# Patient Record
Sex: Male | Born: 1960
Health system: Southern US, Community
[De-identification: ages and names within clinical notes are randomized; demographics above are authoritative.]

## PROBLEM LIST (undated history)

## (undated) DIAGNOSIS — M25519 Pain in unspecified shoulder: Secondary | ICD-10-CM

## (undated) DIAGNOSIS — G4700 Insomnia, unspecified: Secondary | ICD-10-CM

## (undated) DIAGNOSIS — F40240 Claustrophobia: Secondary | ICD-10-CM

## (undated) DIAGNOSIS — R200 Anesthesia of skin: Secondary | ICD-10-CM

## (undated) DIAGNOSIS — R51 Headache: Secondary | ICD-10-CM

## (undated) DIAGNOSIS — C4490 Unspecified malignant neoplasm of skin, unspecified: Secondary | ICD-10-CM

## (undated) DIAGNOSIS — K602 Anal fissure, unspecified: Secondary | ICD-10-CM

## (undated) DIAGNOSIS — C801 Malignant (primary) neoplasm, unspecified: Secondary | ICD-10-CM

## (undated) DIAGNOSIS — E785 Hyperlipidemia, unspecified: Secondary | ICD-10-CM

## (undated) DIAGNOSIS — N189 Chronic kidney disease, unspecified: Secondary | ICD-10-CM

## (undated) DIAGNOSIS — M76899 Other specified enthesopathies of unspecified lower limb, excluding foot: Secondary | ICD-10-CM

## (undated) DIAGNOSIS — M1990 Unspecified osteoarthritis, unspecified site: Secondary | ICD-10-CM

## (undated) DIAGNOSIS — K219 Gastro-esophageal reflux disease without esophagitis: Secondary | ICD-10-CM

## (undated) DIAGNOSIS — Z8719 Personal history of other diseases of the digestive system: Secondary | ICD-10-CM

## (undated) DIAGNOSIS — Z87442 Personal history of urinary calculi: Secondary | ICD-10-CM

## (undated) DIAGNOSIS — I499 Cardiac arrhythmia, unspecified: Secondary | ICD-10-CM

## (undated) DIAGNOSIS — E119 Type 2 diabetes mellitus without complications: Principal | ICD-10-CM

## (undated) DIAGNOSIS — R030 Elevated blood-pressure reading, without diagnosis of hypertension: Secondary | ICD-10-CM

## (undated) HISTORY — DX: Unspecified osteoarthritis, unspecified site: M19.90

## (undated) HISTORY — DX: Pain in unspecified shoulder: M25.519

## (undated) HISTORY — PX: LIPOMA EXCISION: SHX5283

## (undated) HISTORY — DX: Anal fissure, unspecified: K60.2

## (undated) HISTORY — DX: Other specified enthesopathies of unspecified lower limb, excluding foot: M76.899

## (undated) HISTORY — DX: Chronic kidney disease, unspecified: N18.9

## (undated) HISTORY — DX: Malignant (primary) neoplasm, unspecified: C80.1

## (undated) HISTORY — DX: Type 2 diabetes mellitus without complications: E11.9

## (undated) HISTORY — PX: OTHER SURGICAL HISTORY: SHX169

## (undated) HISTORY — DX: Hyperlipidemia, unspecified: E78.5

## (undated) HISTORY — DX: Elevated blood-pressure reading, without diagnosis of hypertension: R03.0

---
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
## 1992-08-04 HISTORY — PX: VASECTOMY: SHX75

## 1998-08-04 HISTORY — PX: BACK SURGERY: SHX140

## 1999-08-05 HISTORY — PX: HERNIA REPAIR: SHX51

## 2007-04-27 ENCOUNTER — Ambulatory Visit (HOSPITAL_BASED_OUTPATIENT_CLINIC_OR_DEPARTMENT_OTHER): Admission: RE | Admit: 2007-04-27 | Discharge: 2007-04-27 | Payer: Self-pay | Admitting: Urology

## 2007-04-29 ENCOUNTER — Ambulatory Visit (HOSPITAL_COMMUNITY): Admission: RE | Admit: 2007-04-29 | Discharge: 2007-04-29 | Payer: Self-pay | Admitting: Urology

## 2008-06-23 ENCOUNTER — Ambulatory Visit: Payer: Self-pay | Admitting: Gastroenterology

## 2008-06-23 DIAGNOSIS — R933 Abnormal findings on diagnostic imaging of other parts of digestive tract: Secondary | ICD-10-CM | POA: Insufficient documentation

## 2008-07-06 ENCOUNTER — Ambulatory Visit (HOSPITAL_COMMUNITY): Admission: RE | Admit: 2008-07-06 | Discharge: 2008-07-06 | Payer: Self-pay | Admitting: Gastroenterology

## 2008-07-06 ENCOUNTER — Encounter: Payer: Self-pay | Admitting: Gastroenterology

## 2008-07-06 ENCOUNTER — Ambulatory Visit: Payer: Self-pay | Admitting: Gastroenterology

## 2008-07-13 ENCOUNTER — Ambulatory Visit (HOSPITAL_COMMUNITY): Admission: RE | Admit: 2008-07-13 | Discharge: 2008-07-13 | Payer: Self-pay | Admitting: General Surgery

## 2008-07-18 ENCOUNTER — Encounter: Payer: Self-pay | Admitting: Gastroenterology

## 2008-07-24 ENCOUNTER — Encounter: Payer: Self-pay | Admitting: Family Medicine

## 2008-08-04 DIAGNOSIS — C259 Malignant neoplasm of pancreas, unspecified: Secondary | ICD-10-CM

## 2008-08-04 HISTORY — DX: Malignant neoplasm of pancreas, unspecified: C25.9

## 2008-08-04 HISTORY — PX: OTHER SURGICAL HISTORY: SHX169

## 2008-08-04 HISTORY — PX: BIOPSY PANCREAS: PRO26

## 2008-08-14 ENCOUNTER — Encounter: Payer: Self-pay | Admitting: Family Medicine

## 2008-08-14 ENCOUNTER — Inpatient Hospital Stay (HOSPITAL_COMMUNITY): Admission: RE | Admit: 2008-08-14 | Discharge: 2008-08-18 | Payer: Self-pay | Admitting: General Surgery

## 2008-08-14 ENCOUNTER — Encounter (INDEPENDENT_AMBULATORY_CARE_PROVIDER_SITE_OTHER): Payer: Self-pay | Admitting: General Surgery

## 2008-08-23 ENCOUNTER — Encounter: Payer: Self-pay | Admitting: Gastroenterology

## 2008-10-05 ENCOUNTER — Encounter: Payer: Self-pay | Admitting: Gastroenterology

## 2008-10-18 ENCOUNTER — Emergency Department (HOSPITAL_COMMUNITY): Admission: EM | Admit: 2008-10-18 | Discharge: 2008-10-18 | Payer: Self-pay | Admitting: Emergency Medicine

## 2008-10-20 ENCOUNTER — Ambulatory Visit: Payer: Self-pay | Admitting: Family Medicine

## 2008-10-20 DIAGNOSIS — M25519 Pain in unspecified shoulder: Secondary | ICD-10-CM

## 2008-10-20 DIAGNOSIS — S8000XA Contusion of unspecified knee, initial encounter: Secondary | ICD-10-CM | POA: Insufficient documentation

## 2008-10-20 HISTORY — DX: Pain in unspecified shoulder: M25.519

## 2008-11-06 ENCOUNTER — Telehealth: Payer: Self-pay | Admitting: Family Medicine

## 2008-12-25 ENCOUNTER — Ambulatory Visit (HOSPITAL_COMMUNITY): Admission: RE | Admit: 2008-12-25 | Discharge: 2008-12-25 | Payer: Self-pay | Admitting: Urology

## 2009-01-02 ENCOUNTER — Ambulatory Visit: Payer: Self-pay | Admitting: Family Medicine

## 2009-01-09 ENCOUNTER — Emergency Department (HOSPITAL_COMMUNITY): Admission: EM | Admit: 2009-01-09 | Discharge: 2009-01-09 | Payer: Self-pay | Admitting: Emergency Medicine

## 2009-01-09 ENCOUNTER — Ambulatory Visit (HOSPITAL_BASED_OUTPATIENT_CLINIC_OR_DEPARTMENT_OTHER): Admission: RE | Admit: 2009-01-09 | Discharge: 2009-01-09 | Payer: Self-pay | Admitting: Urology

## 2009-01-11 ENCOUNTER — Emergency Department (HOSPITAL_COMMUNITY): Admission: EM | Admit: 2009-01-11 | Discharge: 2009-01-11 | Payer: Self-pay | Admitting: Emergency Medicine

## 2009-02-15 ENCOUNTER — Telehealth: Payer: Self-pay | Admitting: Family Medicine

## 2009-03-09 ENCOUNTER — Telehealth: Payer: Self-pay | Admitting: Family Medicine

## 2009-03-30 ENCOUNTER — Ambulatory Visit: Payer: Self-pay | Admitting: Family Medicine

## 2009-03-30 DIAGNOSIS — R030 Elevated blood-pressure reading, without diagnosis of hypertension: Secondary | ICD-10-CM

## 2009-03-30 DIAGNOSIS — G44229 Chronic tension-type headache, not intractable: Secondary | ICD-10-CM | POA: Insufficient documentation

## 2009-03-30 DIAGNOSIS — E119 Type 2 diabetes mellitus without complications: Secondary | ICD-10-CM

## 2009-03-30 HISTORY — DX: Type 2 diabetes mellitus without complications: E11.9

## 2009-03-30 HISTORY — DX: Elevated blood-pressure reading, without diagnosis of hypertension: R03.0

## 2009-04-20 ENCOUNTER — Telehealth: Payer: Self-pay | Admitting: Family Medicine

## 2009-04-27 ENCOUNTER — Ambulatory Visit: Payer: Self-pay | Admitting: Family Medicine

## 2009-04-30 LAB — CONVERTED CEMR LAB
AST: 42 units/L — ABNORMAL HIGH (ref 0–37)
Albumin: 3.9 g/dL (ref 3.5–5.2)
Alkaline Phosphatase: 50 units/L (ref 39–117)
HDL: 36.2 mg/dL — ABNORMAL LOW (ref >39.00)
Hgb A1c MFr Bld: 5.6 % (ref 4.6–6.5)
Microalb Creat Ratio: 2.3 mg/g (ref 0.0–30.0)
Microalb, Ur: 0.5 mg/dL (ref 0.0–1.9)
VLDL: 32.8 mg/dL (ref 0.0–40.0)

## 2009-06-30 ENCOUNTER — Encounter (INDEPENDENT_AMBULATORY_CARE_PROVIDER_SITE_OTHER): Payer: Self-pay | Admitting: *Deleted

## 2009-07-04 ENCOUNTER — Encounter (INDEPENDENT_AMBULATORY_CARE_PROVIDER_SITE_OTHER): Payer: Self-pay | Admitting: *Deleted

## 2009-07-09 ENCOUNTER — Encounter: Admission: RE | Admit: 2009-07-09 | Discharge: 2009-07-09 | Payer: Self-pay | Admitting: General Surgery

## 2009-07-12 ENCOUNTER — Encounter (INDEPENDENT_AMBULATORY_CARE_PROVIDER_SITE_OTHER): Payer: Self-pay | Admitting: *Deleted

## 2009-07-12 ENCOUNTER — Encounter: Payer: Self-pay | Admitting: Gastroenterology

## 2009-08-04 HISTORY — PX: OTHER SURGICAL HISTORY: SHX169

## 2009-08-15 ENCOUNTER — Encounter: Payer: Self-pay | Admitting: Family Medicine

## 2009-08-24 ENCOUNTER — Encounter: Payer: Self-pay | Admitting: Family Medicine

## 2009-10-05 ENCOUNTER — Telehealth: Payer: Self-pay | Admitting: Family Medicine

## 2009-10-18 ENCOUNTER — Telehealth: Payer: Self-pay | Admitting: Family Medicine

## 2009-10-25 ENCOUNTER — Ambulatory Visit: Payer: Self-pay | Admitting: Family Medicine

## 2009-10-26 LAB — CONVERTED CEMR LAB
ALT: 20 units/L (ref 0–53)
Albumin: 4.2 g/dL (ref 3.5–5.2)
Alkaline Phosphatase: 59 units/L (ref 39–117)
Basophils Absolute: 0 10*3/uL (ref 0.0–0.1)
Basophils Relative: 0.1 % (ref 0.0–3.0)
CO2: 31 meq/L (ref 19–32)
Calcium: 9.6 mg/dL (ref 8.4–10.5)
Chloride: 107 meq/L (ref 96–112)
Eosinophils Absolute: 0.1 10*3/uL (ref 0.0–0.7)
Hemoglobin: 15.2 g/dL (ref 13.0–17.0)
Hgb A1c MFr Bld: 5.5 % (ref 4.6–6.5)
LDL Cholesterol: 70 mg/dL (ref 0–99)
Lymphs Abs: 1.4 10*3/uL (ref 0.7–4.0)
Monocytes Absolute: 0.4 10*3/uL (ref 0.1–1.0)
Neutro Abs: 3.3 10*3/uL (ref 1.4–7.7)
Potassium: 4.1 meq/L (ref 3.5–5.1)
RBC: 4.88 M/uL (ref 4.22–5.81)
RDW: 12 % (ref 11.5–14.6)
Sodium: 145 meq/L (ref 135–145)
Total Protein: 7.3 g/dL (ref 6.0–8.3)
Triglycerides: 159 mg/dL — ABNORMAL HIGH (ref 0.0–149.0)

## 2009-10-29 ENCOUNTER — Ambulatory Visit (HOSPITAL_COMMUNITY): Admission: RE | Admit: 2009-10-29 | Discharge: 2009-10-29 | Payer: Self-pay | Admitting: Orthopedic Surgery

## 2009-10-30 ENCOUNTER — Telehealth: Payer: Self-pay | Admitting: Family Medicine

## 2009-11-07 ENCOUNTER — Ambulatory Visit (HOSPITAL_COMMUNITY): Admission: RE | Admit: 2009-11-07 | Discharge: 2009-11-07 | Payer: Self-pay | Admitting: Orthopedic Surgery

## 2009-11-22 ENCOUNTER — Ambulatory Visit (HOSPITAL_BASED_OUTPATIENT_CLINIC_OR_DEPARTMENT_OTHER): Admission: RE | Admit: 2009-11-22 | Discharge: 2009-11-22 | Payer: Self-pay | Admitting: Orthopedic Surgery

## 2009-12-13 ENCOUNTER — Telehealth: Payer: Self-pay | Admitting: Family Medicine

## 2010-02-19 ENCOUNTER — Telehealth (INDEPENDENT_AMBULATORY_CARE_PROVIDER_SITE_OTHER): Payer: Self-pay | Admitting: *Deleted

## 2010-05-12 LAB — HM DIABETES FOOT EXAM

## 2010-05-20 ENCOUNTER — Ambulatory Visit: Payer: Self-pay | Admitting: Family Medicine

## 2010-05-20 DIAGNOSIS — M76899 Other specified enthesopathies of unspecified lower limb, excluding foot: Secondary | ICD-10-CM

## 2010-05-20 HISTORY — DX: Other specified enthesopathies of unspecified lower limb, excluding foot: M76.899

## 2010-05-23 LAB — CONVERTED CEMR LAB
Creatinine,U: 95 mg/dL
Hgb A1c MFr Bld: 6 % (ref 4.6–6.5)
Microalb Creat Ratio: 0.4 mg/g (ref 0.0–30.0)
Microalb, Ur: 0.4 mg/dL (ref 0.0–1.9)

## 2010-07-15 ENCOUNTER — Telehealth: Payer: Self-pay | Admitting: Family Medicine

## 2010-09-03 NOTE — Assessment & Plan Note (Signed)
Summary: cpx/pt coming in fasting/cjr   Vital Signs:  Patient profile:   50 year old male Weight:      274.50 pounds BMI:     34.43 Temp:     97.8 degrees F oral Pulse rate:   60 / minute Pulse rhythm:   regular Resp:     12 per minute BP sitting:   120 / 80  (left arm) Cuff size:   large  Vitals Entered By: Sid Falcon LPN (October 25, 2009 9:09 AM)  History of Present Illness: Here for CPE  last tetanus 2008.  Exercising with "boot camp" and has lost over 35 pounds. Feels well overall but having some knee and shoulder pains evaluated by ortho.  PMH, SH, and FH reviewed.  Allergies: 1)  Morphine  Past History:  Family History: Last updated: 10/25/2009 Family History of Diabetes: brother  Social History: Last updated: 03/30/2009 Married Never Smoked Alcohol use-no  Risk Factors: Smoking Status: never (03/30/2009)  Past Medical History: Depression Diabetes Type 2 dx 2004 Kidney Stones Obesity LV hypertrophy Hyperlipidemia  Past Surgical History: lipoma excision 1/10 benign lesion excised pancreas. PMH-FH-SH reviewed for relevance  Family History: Family History of Diabetes: brother  Review of Systems  The patient denies anorexia, fever, weight gain, vision loss, decreased hearing, hoarseness, chest pain, syncope, dyspnea on exertion, peripheral edema, prolonged cough, headaches, hemoptysis, abdominal pain, melena, hematochezia, severe indigestion/heartburn, hematuria, incontinence, genital sores, muscle weakness, suspicious skin lesions, transient blindness, difficulty walking, depression, unusual weight change, abnormal bleeding, enlarged lymph nodes, and testicular masses.    Physical Exam  General:  Well-developed,well-nourished,in no acute distress; alert,appropriate and cooperative throughout examination Head:  Normocephalic and atraumatic without obvious abnormalities. No apparent alopecia or balding. Eyes:  No corneal or conjunctival  inflammation noted. EOMI. Perrla. Funduscopic exam benign, without hemorrhages, exudates or papilledema. Vision grossly normal. Ears:  External ear exam shows no significant lesions or deformities.  Otoscopic examination reveals clear canals, tympanic membranes are intact bilaterally without bulging, retraction, inflammation or discharge. Hearing is grossly normal bilaterally. Mouth:  Oral mucosa and oropharynx without lesions or exudates.  Teeth in good repair. Neck:  No deformities, masses, or tenderness noted. Lungs:  Normal respiratory effort, chest expands symmetrically. Lungs are clear to auscultation, no crackles or wheezes. Heart:  Normal rate and regular rhythm. S1 and S2 normal without gallop, murmur, click, rub or other extra sounds. Abdomen:  scar prior surgery. soft, non-tender, and normal bowel sounds.   Rectal:  No external abnormalities noted. Normal sphincter tone. No rectal masses or tenderness. Prostate:  Prostate gland firm and smooth, no enlargement, nodularity, tenderness, mass, asymmetry or induration. Msk:  No deformity or scoliosis noted of thoracic or lumbar spine.   Extremities:  No clubbing, cyanosis, edema, or deformity noted with normal full range of motion of all joints.   Neurologic:  No cranial nerve deficits noted. Station and gait are normal. Plantar reflexes are down-going bilaterally. DTRs are symmetrical throughout. Sensory, motor and coordinative functions appear intact. Skin:  Intact without suspicious lesions or rashes Cervical Nodes:  No lymphadenopathy noted Psych:  normally interactive, good eye contact, not anxious appearing, and not depressed appearing.     Impression & Recommendations:  Problem # 1:  ROUTINE GENERAL MEDICAL [email or web address]  CARE FACL (ICD-V70.0) congratulated on weight loss.  Cont exercise.  Obtain screening labs. Orders: TLB-Lipid Panel (80061-LIPID)  TLB-BMP (Basic Metabolic Panel-BMET) (80048-METABOL) TLB-CBC Platelet -  w/Differential (85025-CBCD) TLB-Hepatic/Liver Function Pnl (80076-HEPATIC) TLB-TSH (Thyroid Stimulating Hormone) (84443-TSH)  Complete  Medication List: 1)  Celebrex 200 Mg Caps (Celecoxib) .... Once daily 2)  Actos 30 Mg Tabs (Pioglitazone hcl) .... Once daily 3)  Zyrtec Allergy 10 Mg Tabs (Cetirizine hcl) .... Once daily 4)  Ambien Cr 12.5 Mg Cr-tabs (Zolpidem tartrate) .... As needed 5)  Onetouch Ultra Test Strp (Glucose blood) .... Test at least once daily 6)  Onetouch Ultrasoft Lancets Misc (Lancets) .... Test at least once daily 7)  Aspirin 325 Mg Tabs (Aspirin) .... Once daily 8)  Ativan 0.5 Mg Tabs (Lorazepam) .... As needed 9)  Protonix 40 Mg Tbec (Pantoprazole sodium) .... Once daily 10)  Trazodone Hcl 50 Mg Tabs (Trazodone hcl) .... As needed 11)  Vytorin 10-20 Mg Tabs (Ezetimibe-simvastatin) .... One by mouth once daily  Other Orders: TLB-A1C / Hgb A1C (Glycohemoglobin) (83036-A1C)  Patient Instructions: 1)  Please schedule a follow-up appointment in 6 months .  2)  It is important that you exercise reguarly at least 20 minutes 5 times a week. If you develop chest pain, have severe difficulty breathing, or feel very tired, stop exercising immediately and seek medical attention.  3)  You need to lose weight. Consider a lower calorie diet and regular exercise.  Prescriptions: ACTOS 30 MG TABS (PIOGLITAZONE HCL) once daily  #90 x 3   Entered and Authorized by:   Evelena Peat MD   Signed by:   Evelena Peat MD on 10/25/2009   Method used:   Faxed to ...       [facility]       [address] [place], [place]  [address]       Ph: [phone]       Fax: [phone]   RxID:   [id number] CELEBREX 200 MG CAPS (CELECOXIB) once daily  #90 x 3   Entered and Authorized by:   Evelena Peat MD   Signed by:   Evelena Peat MD on 10/25/2009   Method used:   Faxed to ...       [facility] ([facility]       [address] [place], [place]  [address]        Ph: [phone]       Fax: [phone]   RxID:   [id number] VYTORIN 10-20 MG TABS (EZETIMIBE-SIMVASTATIN) one by mouth once daily  #90 x 3   Entered and Authorized by:   Evelena Peat MD   Signed by:   Evelena Peat MD on 10/25/2009   Method used:   Faxed to ...       [facility] (mail[facility]       [address] [place], [place]  [address]       Ph: [phone]       Fax: [phone]   RxID:   [id number]   Preventive Care Screening  Last Tetanus Booster:    Date:  02/26/2007    Results:  Historical

## 2010-09-03 NOTE — Progress Notes (Signed)
Summary: ok to wait on refills until CPX  Phone Note Call from Patient Call back at Home Phone [phone]   Summary of Call: Fine to wait on refills until physical.  Continues with something I can't make out.  Message forwarded to Roland.  Initial call taken by: Rudy Jew, RN,  October 18, 2009 9:23 AM  Follow-up for Phone Call        Received faxed request for mail order refills on Celebrex, Actos and Vytorin.  Message left on home phone pt needs return OV and or CPX.   Message recieved back that it was OK to wait on refills until OV Follow-up by: Sid Falcon LPN,  October 18, 2009 11:11 AM

## 2010-09-03 NOTE — Medication Information (Signed)
Summary: Approved-Celebrex/[employer]  Approved-Celebrex/[employer]   Imported By: Sherian Rein 08/27/2009 14:31:29  _____________________________________________________________________  External Attachment:    Type:   Image     Comment:   External Document

## 2010-09-03 NOTE — Progress Notes (Signed)
Summary: refill  Phone Note Call from Patient   Caller: [phone] Reason for Call: Refill Medication Summary of Call: Refill of Celebrex 200mg  #180 one cap two times a day to [facility] [phone].  They said we'd have to contact you because they've never filled it.   One refill sent.  Rudy Jew, RN  February 19, 2010 12:57 PM  Initial call taken by: Rudy Jew, RN,  February 19, 2010 12:53 PM

## 2010-09-03 NOTE — Assessment & Plan Note (Signed)
Summary: follow up/pt coming in fasting/cjr   Vital Signs:  Patient profile:   50 year old male Weight:      274 pounds Temp:     98.3 degrees F oral BP sitting:   124 / 82  (left arm) Cuff size:   large  Vitals Entered By: Sid Falcon LPN (May 20, 2010 8:28 AM)  History of Present Illness: Patient seen for the following  Type 2 diabetes. History of excellent control but not checking blood sugars recently. Not exercising recently. No symptoms of hyper or hypoglycemia.  One month history left lateral hip pain. No injury. Aching quality. Moderate severity. No alleviating factors. Has not tried ice. No relief with Celebrex or aspirin. Previous injection helped.  Hyperlipidemia treated Vytorin. Lipids checked in March.  Diabetes Management History:      He has not been enrolled in the "Diabetic Education Program".  He states understanding of dietary principles and is following his diet appropriately.  No sensory loss is reported.  Self foot exams are being performed.  He is not checking home blood sugars.  He says that he is exercising.        Hypoglycemic symptoms are not occurring.  No hyperglycemic symptoms are reported.        There are no symptoms to suggest diabetic complications.  No changes have been made to his treatment plan since last visit.    Preventive Screening-Counseling & Management  Caffeine-Diet-Exercise     Does Patient Exercise: yes  Allergies: 1)  Morphine  Past History:  Past Medical History: Last updated: 10/25/2009 Depression Diabetes Type 2 dx 2004 Kidney Stones Obesity LV hypertrophy Hyperlipidemia  Past Surgical History: Last updated: 10/25/2009 lipoma excision 1/10 benign lesion excised pancreas.  Family History: Last updated: 10/25/2009 Family History of Diabetes: brother  Social History: Last updated: 03/30/2009 Married Never Smoked Alcohol use-no  Risk Factors: Smoking Status: never (03/30/2009) PMH-FH-SH reviewed for  relevance  Social History: Does Patient Exercise:  yes  Physical Exam  General:  Well-developed,well-nourished,in no acute distress; alert,appropriate and cooperative throughout examination Ears:  External ear exam shows no significant lesions or deformities.  Otoscopic examination reveals clear canals, tympanic membranes are intact bilaterally without bulging, retraction, inflammation or discharge. Hearing is grossly normal bilaterally. Mouth:  Oral mucosa and oropharynx without lesions or exudates.  Teeth in good repair. Neck:  No deformities, masses, or tenderness noted. Lungs:  Normal respiratory effort, chest expands symmetrically. Lungs are clear to auscultation, no crackles or wheezes. Heart:  normal rate, regular rhythm, and no murmur.   Extremities:  full range of motion left hip. Tender greater trochanteric bursa region. No erythema or ecchymosis. Neurologic:  alert & oriented X3 and cranial nerves II-XII intact.   Skin:  no rashes and no suspicious lesions.   Cervical Nodes:  No lymphadenopathy noted Psych:  normally interactive, good eye contact, not anxious appearing, and not depressed appearing.    Diabetes Management Exam:    Foot Exam (with socks and/or shoes not present):       Sensory-Pinprick/Light touch:          Left medial foot (L-4): normal          Left dorsal foot (L-5): normal          Left lateral foot (S-1): normal          Right medial foot (L-4): normal          Right dorsal foot (L-5): normal  Right lateral foot (S-1): normal       Sensory-Monofilament:          Left foot: normal          Right foot: normal       Inspection:          Left foot: normal          Right foot: normal       Nails:          Left foot: normal          Right foot: normal    Eye Exam:       Eye Exam done elsewhere          Date: 04/04/2010          Results: normal          Done by: Dr Hyacinth Meeker   Impression & Recommendations:  Problem # 1:  DIAB W/O COMP TYPE  II/UNS NOT STATED UNCNTRL (ICD-250.00) controlled.  recheck labs.  Get back to regular exercise. He will get flu vaccine through his work. His updated medication list for this problem includes:    Actos 30 Mg Tabs (Pioglitazone hcl) ..... Once daily    Aspirin 325 Mg Tabs (Aspirin) ..... Once daily  Orders: TLB-A1C / Hgb A1C (Glycohemoglobin) (83036-A1C) TLB-Microalbumin/Creat Ratio, Urine (82043-MALB) Venipuncture (78469) Specimen Handling (62952)  Problem # 2:  BURSITIS, LEFT HIP (ICD-726.5) Assessment: New  discussed risk and benefits of corticosteroid injection to left lateral hip and patient consented. We reviewed risk including bleeding, bruising, and infection. Prepped lateral hip area with Betadine and using one and 1/2 inch 25-gauge needle injected 40 mg Depo-Medrol and 2 cc plain Xylocaine into trochanteric bursa region.  Orders: Joint Aspirate / Injection, Large (20610) Depo- Medrol 40mg  (J1030)  Complete Medication List: 1)  Celebrex 200 Mg Caps (Celecoxib) .... One tab two times a day 2)  Actos 30 Mg Tabs (Pioglitazone hcl) .... Once daily 3)  Zyrtec Allergy 10 Mg Tabs (Cetirizine hcl) .... Once daily 4)  Onetouch Ultra Test Strp (Glucose blood) .... Test at least once daily 5)  Onetouch Ultrasoft Lancets Misc (Lancets) .... Test at least once daily 6)  Aspirin 325 Mg Tabs (Aspirin) .... Once daily 7)  Ativan 0.5 Mg Tabs (Lorazepam) .... As needed 8)  Trazodone Hcl 50 Mg Tabs (Trazodone hcl) .... As needed 9)  Vytorin 10-20 Mg Tabs (Ezetimibe-simvastatin) .... One by mouth once daily  Diabetes Management Assessment/Plan:      His blood pressure goal is < 130/80.    Patient Instructions: 1)  Please schedule a follow-up appointment in 6 months .  2)  It is important that you exercise reguarly at least 20 minutes 5 times a week. If you develop chest pain, have severe difficulty breathing, or feel very tired, stop exercising immediately and seek medical attention.  3)   You need to lose weight. Consider a lower calorie diet and regular exercise.  4)  See your eye doctor yearly to check for diabetic eye damage.   Orders Added: 1)  TLB-A1C / Hgb A1C (Glycohemoglobin) [83036-A1C] 2)  TLB-Microalbumin/Creat Ratio, Urine [82043-MALB] 3)  Venipuncture [84132] 4)  Specimen Handling [99000] 5)  Est. Patient Level III [44010] 6)  Joint Aspirate / Injection, Large [20610] 7)  Depo- Medrol 40mg  [J1030]

## 2010-09-03 NOTE — Progress Notes (Signed)
Summary: REQ FOR REFILL RX to [facility]  Phone Note Call from Patient   Caller: Spouse Marisue Humble Lievanos) via faxed document Reason for Call: Refill Medication Summary of Call: Pts wife sent a document via fax to Dr Caryl Never req that Rx for meds: Celebrex 200mg  #180  /  Actos 30mg  #90  /  Vytorin 10/20 #90.... be sent to [facility] .... She states that the Rx for these meds [facility] and she has had failed attempts trying to get [facility] to take care of it.  Pt / Pts wife can be reached at [phone] with any further questions or concerns.  Initial call taken by: Debbra Riding,  October 30, 2009 12:30 PM  Follow-up for Phone Call        Dresser. Do you have any ideas?  I guess we might have to send new rxs to [facility]. Follow-up by: Evelena Peat MD,  October 30, 2009 2:30 PM  Additional Follow-up for Phone Call Additional follow up Details #1::        # rx e-scribed to [facility], pt wife informed Additional Follow-up by: Sid Falcon LPN,  October 31, 2009 1:05 PM    New/Updated Medications: CELEBREX 200 MG CAPS (CELECOXIB) one tab two times a day Prescriptions: VYTORIN 10-20 MG TABS (EZETIMIBE-SIMVASTATIN) one by mouth once daily  #90 x 3   Entered by:   Sid Falcon LPN   Authorized by:   Evelena Peat MD   Signed by:   Sid Falcon LPN on 28/41/3244   Method used:   Electronically to        [facility] (mail-order)             ,          Ph: [phone]       Fax: [phone]   RxID:   [id number] ACTOS 30 MG TABS (PIOGLITAZONE HCL) once daily  #90 x 3   Entered by:   Sid Falcon LPN   Authorized by:   Evelena Peat MD   Signed by:   Sid Falcon LPN on 41/66/0630   Method used:   Electronically to        [facility] (mail-order)             ,          Ph: [phone]       Fax: [phone]   RxID:   [id number] CELEBREX 200 MG CAPS (CELECOXIB) one tab two times a day  #180 x 3   Entered by:   Sid Falcon LPN  Authorized by:   Evelena Peat MD   Signed by:   Sid Falcon LPN on 60/73/7106   Method used:   Electronically to        [facility] (mail-order)             ,          Ph: [phone]       Fax: [phone]   RxID:   [id number]

## 2010-09-03 NOTE — Progress Notes (Signed)
Summary: Ativan refill request  Phone Note Call from Patient   Caller: Patient Call For: Evelena Peat MD Summary of Call: [facility] faxed Rx request for Ativan 0.5mg , take one tab as needed, #30  Initial call taken by: Sid Falcon LPN,  Dec 13, 2009 4:38 PM  Follow-up for Phone Call        may refill once. Follow-up by: Evelena Peat MD,  Dec 13, 2009 9:17 PM    Prescriptions: ATIVAN 0.5 MG TABS (LORAZEPAM) as needed  #30 x 0   Entered by:   Sid Falcon LPN   Authorized by:   Evelena Peat MD   Signed by:   Sid Falcon LPN on 51/88/4166   Method used:   Telephoned to ...       [facility] [address] (retail)       [address]       [place], [place]  [address]       Ph: [phone]       Fax: [phone]   RxID:   [id number]

## 2010-09-03 NOTE — Medication Information (Signed)
Summary: Prior Authorization for Celebrex/[employer]  Prior Authorization for Celebrex/[employer]   Imported By: Lanelle Bal 08/27/2009 08:22:43  _____________________________________________________________________  External Attachment:    Type:   Image     Comment:   External Document

## 2010-09-03 NOTE — Progress Notes (Signed)
Summary: Requesting referral to Orthopedic Surgeon  Phone Note Call from Patient   Caller: Spouse Marisue Humble  (pt cell [phone]) Call For: Evelena Peat MD Summary of Call: Faxed note (on Dr Lucie Leather desk) requesting a referral to an Orthopedic Surgeon for his left knee. He's had surgery on it in the past.  He's also having trouble with a shoulder, but his knee is the primary concern.  "Who do you recommend?"  Pt has lost #30 lbs since fall with agressive exercise & improved diet.  Initial call taken by: Sid Falcon LPN,  October 05, 2009 4:40 PM  Follow-up for Phone Call        Spoke with pt's wife .  I gave her name of several local orthopedist and she will try to set up. Follow-up by: Evelena Peat MD,  October 05, 2009 5:00 PM

## 2010-09-03 NOTE — Procedures (Signed)
Summary: [facility] prep/[facility]  [facility]   Imported By: Lester Orchards 06/30/2008 12:39:21  _____________________________________________________________________  External Attachment:    Type:   Image     Comment:   External Document

## 2010-09-05 NOTE — Progress Notes (Signed)
Summary: wants results faxed  Phone Note Call from Patient Call back at Home Phone [phone]   Caller: Patient---triage vm Reason for Call: Lab or Test Results Summary of Call: requesting copy of last lab results. please fax to [phone] Initial call taken by: Warnell Forester,  July 15, 2010 9:51 AM  Follow-up for Phone Call        Labs faxed, pt will be at fax machine as requested, needed today for life insurance Follow-up by: Sid Falcon LPN,  July 15, 2010 10:23 AM

## 2010-10-11 ENCOUNTER — Encounter: Payer: Self-pay | Admitting: Family Medicine

## 2010-10-11 ENCOUNTER — Ambulatory Visit (INDEPENDENT_AMBULATORY_CARE_PROVIDER_SITE_OTHER): Payer: BC Managed Care – PPO | Admitting: Family Medicine

## 2010-10-11 DIAGNOSIS — M778 Other enthesopathies, not elsewhere classified: Secondary | ICD-10-CM

## 2010-10-11 DIAGNOSIS — M67919 Unspecified disorder of synovium and tendon, unspecified shoulder: Secondary | ICD-10-CM

## 2010-10-11 DIAGNOSIS — E785 Hyperlipidemia, unspecified: Secondary | ICD-10-CM

## 2010-10-11 DIAGNOSIS — F411 Generalized anxiety disorder: Secondary | ICD-10-CM

## 2010-10-11 DIAGNOSIS — F419 Anxiety disorder, unspecified: Secondary | ICD-10-CM

## 2010-10-11 DIAGNOSIS — E119 Type 2 diabetes mellitus without complications: Secondary | ICD-10-CM

## 2010-10-11 DIAGNOSIS — M76899 Other specified enthesopathies of unspecified lower limb, excluding foot: Secondary | ICD-10-CM

## 2010-10-11 DIAGNOSIS — M7070 Other bursitis of hip, unspecified hip: Secondary | ICD-10-CM

## 2010-10-11 HISTORY — DX: Hyperlipidemia, unspecified: E78.5

## 2010-10-11 MED ORDER — LORAZEPAM 1 MG PO TABS: 1.0000 mg | ORAL_TABLET | Freq: Four times a day (QID) | ORAL | Status: AC | PRN

## 2010-10-11 MED ORDER — LORAZEPAM 0.5 MG PO TABS: 1.0000 mg | ORAL_TABLET | Freq: Four times a day (QID) | ORAL | Status: DC | PRN

## 2010-10-11 MED ORDER — METFORMIN HCL 500 MG PO TABS: 500.0000 mg | ORAL_TABLET | Freq: Two times a day (BID) | ORAL | Status: DC

## 2010-10-11 MED ORDER — EZETIMIBE-SIMVASTATIN 10-20 MG PO TABS: 1.0000 | ORAL_TABLET | Freq: Every day | ORAL | Status: DC

## 2010-10-11 NOTE — Patient Instructions (Signed)
Discontinue Actos after current prescription and start Metformin after that runs out.

## 2010-10-11 NOTE — Progress Notes (Signed)
  Subjective:    Patient ID: [name], male    DOB: 1961-08-03, 50 y.o.   MRN: [id number]  HPI Patient here to discuss several issues as follows:  Type 2 diabetes. Has been on Actos for many years. Not clear why he has not been on metformin but [place]. He has some reservations about continuing with reports of possible link with bladder cancer. Blood sugars have been well-controlled with recent A1c 6.0%. No known contraindications to metformin. Exercises somewhat inconsistently. Recent weight gain.    Hyperlipidemia and needs refills Vytorin. No side effects.    History of anxiety with flying. Previously has used Ativan with good success and request refills.    Recurrent left shoulder pain. Surgery several months ago. No recent injury. Pain poorly localized. Worse with abduction and internal rotation. No neck pain. No weakness. Request steroid injection which has helped previously .  Pain is achy quality.   Recurrent left lateral hip pain. History bursitis and has responded to steroid injection and is requesting the same. Ice without relief. Pain is lateral hip and moderate severity. Exacerbated by exercise. No medial hip pain. No difficulty ambulating.   Review of Systems  Constitutional: Negative for fever, activity change, appetite change and fatigue.  HENT: Negative for ear pain, congestion and trouble swallowing.   Eyes: Negative for pain and visual disturbance.  Respiratory: Negative for cough, shortness of breath and wheezing.   Cardiovascular: Negative for chest pain and palpitations.  Gastrointestinal: Negative for vomiting, abdominal pain, diarrhea and abdominal distention.  Genitourinary: Negative for dysuria, hematuria and testicular pain.  Musculoskeletal: Negative for joint swelling and arthralgias.  Skin: Negative for rash.  Neurological: Negative for dizziness, syncope and headaches.  Hematological: Negative for adenopathy.    Psychiatric/Behavioral: Negative for confusion and dysphoric mood.       Objective:   Physical Exam  patient is alert pleasant moderately obese gentleman in no distress   oropharynx is clear  Eardrums no acute change Neck supple no mass Chest clear to auscultation Heart regular rhythm and rate  Extremities left shoulder reveals no ecchymosis erythema or warmth. Full range of motion. Pain with abduction and internal rotation. No a.c. Joint or bicipital tenderness. Left hip exam reveals full range of motion. Tender over greater trochanteric bursae. No visible erythema or swelling.       Assessment & Plan:   #1 type 2 diabetes. Discontinue Actos after current prescription runs out and start metformin 500 mg twice a day. Reassess A1c 3 months after transition. Work on weight loss  #2 dyslipidemia. Refill Vytorin  #3 situational anxiety. Prescription for Ativan 1 mg to take one hour prior to flying  #4 recurrent bursitis left hip. Discussed risk and benefits of corticosteroid injection and patient consented. Prepped left hip bursa region (greater trochanteric bursa) with Betadine. Using 25-gauge 1 inch needle injected 40 mg Depo-Medrol and 2 cc plain Xylocaine without difficulty. Patient tolerated well  #5 rotator cuff tendinitis left shoulder. Discussed risks and benefits of corticosteroid injection and patient consented. Using #25-gauge one and 1/2 inch needle injected 40 mg of Depo-Medrol and 1 cc of plain Xylocaine subacromial space using posterior lateral approach  after prepping the skin with Betadine. Patient tolerated well.

## 2010-10-13 ENCOUNTER — Encounter: Payer: Self-pay | Admitting: Family Medicine

## 2010-10-22 LAB — POCT HEMOGLOBIN-HEMACUE: Hemoglobin: 15.9 g/dL (ref 13.0–17.0)

## 2010-10-23 LAB — BASIC METABOLIC PANEL
BUN: 14 mg/dL (ref 6–23)
CO2: 28 mEq/L (ref 19–32)
Calcium: 8.9 mg/dL (ref 8.4–10.5)
Creatinine, Ser: 0.74 mg/dL (ref 0.4–1.5)
GFR calc Af Amer: >60 mL/min (ref >60)

## 2010-10-23 LAB — CBC
HCT: 44.7 % (ref 39.0–52.0)
MCHC: 33.8 g/dL (ref 30.0–36.0)
MCV: 91.7 fL (ref 78.0–100.0)
RBC: 4.88 MIL/uL (ref 4.22–5.81)

## 2010-11-11 LAB — CBC
Hemoglobin: 14.9 g/dL (ref 13.0–17.0)
Platelets: 249 10*3/uL (ref 150–400)
RDW: 13.1 % (ref 11.5–15.5)

## 2010-11-11 LAB — URINALYSIS, ROUTINE W REFLEX MICROSCOPIC
Glucose, UA: NEGATIVE mg/dL
Ketones, ur: NEGATIVE mg/dL
Leukocytes, UA: NEGATIVE
Protein, ur: NEGATIVE mg/dL

## 2010-11-11 LAB — GLUCOSE, CAPILLARY: Glucose-Capillary: 118 mg/dL — ABNORMAL HIGH (ref 70–99)

## 2010-11-11 LAB — DIFFERENTIAL
Basophils Absolute: 0 10*3/uL (ref 0.0–0.1)
Lymphocytes Relative: 27 % (ref 12–46)
Neutro Abs: 7.6 10*3/uL (ref 1.7–7.7)
Neutrophils Relative %: 66 % (ref 43–77)

## 2010-11-11 LAB — BASIC METABOLIC PANEL
Chloride: 110 mEq/L (ref 96–112)
GFR calc Af Amer: >60 mL/min (ref >60)
Potassium: 4.1 mEq/L (ref 3.5–5.1)
Sodium: 143 mEq/L (ref 135–145)

## 2010-11-11 LAB — URINE CULTURE: Culture: NO GROWTH

## 2010-11-14 LAB — POCT I-STAT, CHEM 8
BUN: 16 mg/dL (ref 6–23)
Creatinine, Ser: 0.8 mg/dL (ref 0.4–1.5)
Hemoglobin: 14.6 g/dL (ref 13.0–17.0)
Potassium: 4.1 mEq/L (ref 3.5–5.1)
Sodium: 141 mEq/L (ref 135–145)

## 2010-11-14 LAB — CBC
MCHC: 34.2 g/dL (ref 30.0–36.0)
Platelets: 244 10*3/uL (ref 150–400)
RBC: 4.82 MIL/uL (ref 4.22–5.81)
WBC: 6.5 10*3/uL (ref 4.0–10.5)

## 2010-11-18 LAB — TYPE AND SCREEN
ABO/RH(D): A POS
Antibody Screen: NEGATIVE

## 2010-11-18 LAB — COMPREHENSIVE METABOLIC PANEL
ALT: 13 U/L (ref 0–53)
Alkaline Phosphatase: 53 U/L (ref 39–117)
Alkaline Phosphatase: 54 U/L (ref 39–117)
BUN: 7 mg/dL (ref 6–23)
CO2: 29 mEq/L (ref 19–32)
Calcium: 9 mg/dL (ref 8.4–10.5)
Chloride: 104 mEq/L (ref 96–112)
Chloride: 104 mEq/L (ref 96–112)
Creatinine, Ser: 0.88 mg/dL (ref 0.4–1.5)
GFR calc non Af Amer: >60 mL/min (ref >60)
Glucose, Bld: 128 mg/dL — ABNORMAL HIGH (ref 70–99)
Glucose, Bld: 150 mg/dL — ABNORMAL HIGH (ref 70–99)
Potassium: 3.8 mEq/L (ref 3.5–5.1)
Sodium: 141 mEq/L (ref 135–145)
Total Bilirubin: 0.9 mg/dL (ref 0.3–1.2)
Total Bilirubin: 0.9 mg/dL (ref 0.3–1.2)
Total Protein: 6 g/dL (ref 6.0–8.3)

## 2010-11-18 LAB — CBC
HCT: 40.5 % (ref 39.0–52.0)
Hemoglobin: 14.1 g/dL (ref 13.0–17.0)
Hemoglobin: 14.8 g/dL (ref 13.0–17.0)
MCHC: 34.2 g/dL (ref 30.0–36.0)
MCV: 91.4 fL (ref 78.0–100.0)
RBC: 4.8 MIL/uL (ref 4.22–5.81)
RDW: 13 % (ref 11.5–15.5)
WBC: 4 10*3/uL (ref 4.0–10.5)

## 2010-11-18 LAB — DIFFERENTIAL
Monocytes Absolute: 0.3 10*3/uL (ref 0.1–1.0)
Monocytes Relative: 9 % (ref 3–12)
Neutrophils Relative %: 53 % (ref 43–77)

## 2010-11-18 LAB — HEMOGLOBIN A1C
Hgb A1c MFr Bld: 6 % (ref 4.6–6.1)
Mean Plasma Glucose: 126 mg/dL

## 2010-11-18 LAB — ABO/RH: ABO/RH(D): A POS

## 2010-11-18 LAB — URINALYSIS, ROUTINE W REFLEX MICROSCOPIC
Nitrite: NEGATIVE
Protein, ur: NEGATIVE mg/dL
Urobilinogen, UA: 0.2 mg/dL (ref 0.0–1.0)

## 2010-11-18 LAB — GLUCOSE, CAPILLARY
Glucose-Capillary: 111 mg/dL — ABNORMAL HIGH (ref 70–99)
Glucose-Capillary: 140 mg/dL — ABNORMAL HIGH (ref 70–99)
Glucose-Capillary: 142 mg/dL — ABNORMAL HIGH (ref 70–99)
Glucose-Capillary: 146 mg/dL — ABNORMAL HIGH (ref 70–99)
Glucose-Capillary: 147 mg/dL — ABNORMAL HIGH (ref 70–99)
Glucose-Capillary: 148 mg/dL — ABNORMAL HIGH (ref 70–99)
Glucose-Capillary: 149 mg/dL — ABNORMAL HIGH (ref 70–99)
Glucose-Capillary: 150 mg/dL — ABNORMAL HIGH (ref 70–99)
Glucose-Capillary: 98 mg/dL (ref 70–99)

## 2010-11-26 ENCOUNTER — Other Ambulatory Visit: Payer: Self-pay | Admitting: General Surgery

## 2010-11-26 DIAGNOSIS — D3A8 Other benign neuroendocrine tumors: Secondary | ICD-10-CM

## 2010-12-03 ENCOUNTER — Ambulatory Visit
Admission: RE | Admit: 2010-12-03 | Discharge: 2010-12-03 | Disposition: A | Discharge status: Home / Self Care | Payer: BC Managed Care – PPO | Source: Ambulatory Visit | Attending: General Surgery | Admitting: General Surgery

## 2010-12-03 DIAGNOSIS — D3A8 Other benign neuroendocrine tumors: Secondary | ICD-10-CM

## 2010-12-03 MED ORDER — IOHEXOL 300 MG/ML  SOLN
125.0000 mL | Freq: Once | INTRAMUSCULAR | Status: AC | PRN
  Administered 2010-12-03: 125 mL via INTRAVENOUS

## 2010-12-17 ENCOUNTER — Encounter (INDEPENDENT_AMBULATORY_CARE_PROVIDER_SITE_OTHER): Payer: Self-pay | Admitting: General Surgery

## 2010-12-17 ENCOUNTER — Other Ambulatory Visit: Payer: Self-pay | Admitting: *Deleted

## 2010-12-17 DIAGNOSIS — E785 Hyperlipidemia, unspecified: Secondary | ICD-10-CM

## 2010-12-17 MED ORDER — EZETIMIBE-SIMVASTATIN 10-20 MG PO TABS: 1.0000 | ORAL_TABLET | Freq: Every day | ORAL | Status: DC

## 2010-12-17 NOTE — Op Note (Signed)
NAME:  [name], SPARKS                  ACCOUNT NO.:  [id number]   MEDICAL RECORD NO.:  [id number]          PATIENT TYPE:  AMB   LOCATION:  N[facility]C                         FACILITY:  [facility]   PHYSICIAN:  Excell Seltzer. Annabell Howells, M.D.    DATE OF BIRTH:  August 28, 1960   DATE OF PROCEDURE:  01/09/2009  DATE OF DISCHARGE:                               OPERATIVE REPORT   PROCEDURES:  Cystoscopy, left ureteroscopic stone extraction with  holmium lasertripsy and insertion of left double-J stent.   PREOPERATIVE DIAGNOSIS:  Left proximal ureteral stone.   POSTOPERATIVE DIAGNOSIS:  Left proximal ureteral stone.   SURGEON:  Dr. Bjorn Pippin.   ANESTHESIA:  General.   SPECIMEN:  Stone fragments.   DRAIN:  A 6-French 26 cm double-J stent.   COMPLICATIONS:  None.   INDICATIONS:  Mr. Escoto is a 50 year old white male who underwent  lithotripsy by Dr. Vonita Moss last week.  He returned to the emergency  room yesterday morning with severe pain that did not respond to pain  medicine and it was felt that endoscopic management of a 6.5 mm residual  fragment was indicated.   FINDINGS AND PROCEDURE:  The patient was given Cipro.  He was taken to  the operating room where general anesthetic was induced.  He was placed  in lithotomy position.  His perineum and genitalia was prepped with  Betadine solution and he was draped in the usual sterile fashion.  Cystoscopy was performed using the 22-French scope, 12 and 70 degrees  lenses.  Examination revealed a normal urethra.  The external sphincter  was intact.  The prostatic urethra was short without obstruction.  The  bladder wall was smooth and pale without tumor, stones or inflammation.  The ureteral orifices were unremarkable.   The left ureteral orifice was cannulated with a guidewire which was  passed by the stone which was readily seen on fluoroscopy to the kidney.  A 48-cm introducer dilator was then passed to the level of the stone.  The wire and dilator core were  removed.  The 6-French digital  ureteroscope was then passed through the introducer to the stone which  was readily visualized.  This was then engaged with a 200 micron laser  fiber and fragmented.  The fragments flushed back to the kidney.  I  passed the scope up to the kidney and performed additional laser  fragmentation to reduce the fragments to as small size as possible.  I  then used an escape basket to retrieve a few fragments.  However, there  was some bleeding that obscured visualization that prevented me from  removing all the fragments, but based on those retrieved it was felt  there were small enough that they should pass.  At this point, the  guidewire was replaced through the introducer sheath.  The introducer  sheath was removed and a 6-French 26 cm double-J stent was placed over  the wire to the kidney.  The wire was removed leaving a good coil in the  kidney.  The cystoscope was then replaced and then using the stent  string  the stent was pulled back until there was a good coil in the  bladder with retention of the coil in the kidney.  The bladder was  drained.  The cystoscope was removed.  The  stent string was secured to the patient's penis.  He was taken down from  lithotomy position.  His anesthetic was reversed.  He was moved to the  recovery room in stable condition.  There were no complications.  The  stone fragments were given to his wife.  He will follow up later this  week for stent removal and KUB.      Excell Seltzer. Annabell Howells, M.D.  Electronically Signed     JJW/MEDQ  D:  01/10/2009  T:  01/10/2009  Job:  [id number]   cc:   Maretta Bees. Vonita Moss, M.D.  Fax: [phone]

## 2010-12-17 NOTE — Op Note (Signed)
[name], [name]                  ACCOUNT NO.:  [id number]   MEDICAL RECORD NO.:  [id number]          PATIENT TYPE:  INP   LOCATION:  1236                         FACILITY:  [facility]   PHYSICIAN:  Angelia Mould. Derrell Lolling, M.D.DATE OF BIRTH:  06-24-1961   DATE OF PROCEDURE:  08/14/2008  DATE OF DISCHARGE:                               OPERATIVE REPORT   PREOPERATIVE DIAGNOSIS:  Islet cell tumor of pancreas.   POSTOPERATIVE DIAGNOSIS:  Islet cell tumor of pancreas.   OPERATION PERFORMED:  Exploratory laparotomy, resection of islet cell  tumor of pancreas.   SURGEON:  Dr. Claud Kelp.   FIRST ASSISTANT:  Dr. Consuello Bossier.   OPERATIVE INDICATIONS:  This is a 50 year old white male who has a long  history of kidney stones with frequent urologic evaluation and imaging.  He does report a little bit of left upper quadrant abdominal pain for 5-  6 months which was thought to be due to kidney stones.  No nausea or  vomiting.  CT scan done in [facility] office on June 20, 2008  reveals a 4-cm soft tissue density associated with the anterior and  inferior aspect of the body of the pancreas near the neck of the  pancreas.  Reference was made to a CT scan done 14 months ago.  We  repeated the CT scan and it showed it to be an exophytic mass but no  sign of any malignancy or adenopathy and it appeared to be stable in  size compared to the prior CT.  I evaluated the patient as an  outpatient.  Dr. Rob Bunting performed an endoscopic ultrasonographic  evaluation and found a mixed solid and cystic lesion.  He said that it  was very exophytic measuring 4.2 cm involving the neck of the pancreas.  There was no evidence that it involved the main pancreatic duct.  It was  near the portal vein but did not involve it.  Cyst fluid was aspirated  and Dr. Dierdre Searles in Pathology performed cytopathology and immunohistochemical  studies and suggested this was essentially diagnostic for a pancreatic  endocrine neoplasm, or islet cell tumor.  A PET CT was performed which  showed mild metabolic activity in the mass, not much greater than  background pancreatic activity and no evidence of any hypermetabolic  metastasis elsewhere.  CEA and CA19-9 were normal.  All of his  gastrointestinal hormone studies were normal, suggesting this is a  nonfunctioning islet cell tumor mostly likely benign.  I counseled the  patient that this should be resected and I told that we might be able to  do a simple enucleation procedure or it may require a distal  pancreatectomy and splenectomy.  He is brought to operating room  electively.   OPERATIVE FINDINGS:  The patient had about a 3.5 to 4-cm tumor which  felt well-circumscribed and was somewhat compressible.  This was  attached to the very inferior border of the pancreas somewhere near the  junction of the neck of the pancreas and the body of the pancreas.  It  was only attached in an  area about 1 cm x 2 cm.  This showed no signs of  malignancy.  It was not invading any surrounding structures. It did not  involve the pancreatic duct.  The liver and gallbladder felt normal.  The stomach and celiac lymph node areas felt normal.  There was no  adenopathy along the greater curvature of the stomach.  There was no  adenopathy in the root of the small bowel mesentery near the ligament of  Treitz.  I ran the entire small bowel and found no tumors.  I felt no  abnormality.  There was no ascites.  The pancreatic mass  appeared to be  an isolated finding.   OPERATIVE TECHNIQUE:  Following induction of general endotracheal  anesthesia, a Foley catheter and nasogastric tube replaced.  Intravenous  antibiotics were given.  The abdomen was prepped and draped in sterile  fashion.  A surgical checklist and time-out were held identifying the  patient as the correct patient and correct procedure.  Upper midline  incision was made.  The fascia was incised in the midline.   The abdomen  was entered and explored with findings as described above.  Self-  retaining Bookwalter retractor was placed.   After exploring the entire abdomen and pelvis.  We took down the  gastrocolic omentum using the LigaSure device.  There was a couple of  vessels that I tied off with 2-0 silk ties.  This exposed the neck of  the pancreas, body and tail of the pancreas quite nicely.  There were a  lot of avascular areolar attachments of the pancreatic tumor to the  posterior wall of the stomach and these were simply taken down with  sharp dissection and electrocautery.  These appeared to simply be  anatomic adhesions.  We mobilized the posterior wall of the stomach  completely off of the pancreas until we had the superior border pancreas  completely free.  We then placed a self-retaining retractor to hold the  stomach out of the way.  We then mobilized the pancreatic tumor  circumferentially off of the pancreas.  Most of dissection was involved  in taking down avascular tissue planes and ultimately we got to where  the tumor was attached to the inferior border of the pancreas and it was  only attached by a small area.  We divided this using the harmonic  scalpel taking about a 1 cm margin all the way around.  The specimen was  sent to the lab and Dr. Luisa Hart looked at it and said that it was mostly  a cystic mass consistent with preoperative diagnosis.   There were couple small bleeders on the pancreatic tissue which were  cauterized.  The area was irrigated.  There was no bleeding and no  leakage of pancreatic fluid.  We placed a piece of Surgicel gauze there  for about 5 minutes and there was no bleeding.  We then washed out one  more time and then placed Tisseel on the raw surface of the pancreas  where the resection was done.  I placed a 19-French Blake drain across  the body of the pancreas and brought this out through small separate  stab incision in the left upper quadrant,  sutured it to the skin with  nylon suture and connected it to a suction bulb.  We checked around one  more time and found no sign of any bleeding or problems.  An NG tube was  well positioned.  Midline fascia was closed  with running suture of #1  double-stranded PDS.  The skin was closed with skin staples.  Clean  bandages were placed and the patient taken to the recovery room in  stable condition.  Estimated blood loss was about 150 mL.  Complications  none.  Sponge, needle and instrument counts were correct.      Angelia Mould. Derrell Lolling, M.D.  Electronically Signed     HMI/MEDQ  D:  08/14/2008  T:  08/14/2008  Job:  [id number]   cc:   Maretta Bees. Vonita Moss, M.D.  Fax: [phone]   Evelena Peat, M.D.   Rachael Fee, MD  [address]  [place], [place] [address]

## 2010-12-17 NOTE — Op Note (Signed)
[name], [name]                  ACCOUNT NO.:  [id number]   MEDICAL RECORD NO.:  [id number]          PATIENT TYPE:  AMB   LOCATION:  N[facility]C                         FACILITY:  [facility]   PHYSICIAN:  Boston Service, M.D.DATE OF BIRTH:  05/21/1961   DATE OF PROCEDURE:  04/27/2007  DATE OF DISCHARGE:                               OPERATIVE REPORT   PREOPERATIVE DIAGNOSIS:  9 and 7 mm left ureteropelvic junction stones.   POSTOPERATIVE DIAGNOSIS:  9 and 7 mm left ureteropelvic junction stones.   PROCEDURE:  Cystoscopy, retrograde left double-J stent, 6-French 28 cm.   SURGEON:  Boston Service, M.D.   ASSISTANT:  None.   ANESTHESIA:  General.   FINDINGS:  9 and 7 mm left ureteropelvic junction stones.   SPECIMENS:  None.   DRAINS:  6-French 28 cm double J.   COMPLICATIONS:  None obvious.   The patient had prior history of kidney stones in May 2002 and October  2003.  He was living in [place] at the time, RX ureteroscopy, basket  extraction and then lithotripsy.  Recent thorough physical examination  with [facility], urine culture March 29, 2007 no  growth, glucose 115, BUN 15, creatinine 0.8, PSA 0.4.  Microscopic  hematuria workup included CT scan abdomen and pelvis April 16, 2007  the patient had is three nonobstructing stones within the left kidney,  largest of which measures 9 mm, smaller stones measure 7 mm and 3 mm.  The patient also has a 33 mm left peripelvic cyst and a 20 mm right  peripelvic cyst.   PROCEDURE:  Cystoscopy, retrograde left double-J stent placement.   DESCRIPTION OF PROCEDURE:  The patient was prepped and draped in the  dorsolithotomy position after institution of an adequate level of  general anesthesia.  Well lubricated 22-French panendoscope was gently  inserted at the urethral meatus, normal urethra and sphincter.  The  patient had wide-mouth stricture of the bulbous urethra which was gently  negotiated with the well  lubricated scope, nonobstructive prostate.  Clear reflux right and left orifice.  Bladder was inspected with a 12  and 70 degrees lenses and showed no obvious intravesical pathology.   Blocking catheter was selected, positioned at the right ureteral orifice  with gentle injection of contrast, normal course and caliber of the  ureter, pelvis and calyces with prompt drainage at  3-5 minutes.  Similar technique was used on the left side.  The patient had 9 mm and  7 mm filling defects above the UPJ.  With gentle injection of contrast,  they were displaced into the midportion of the renal pelvis.  Prompt  efflux of concentrated urine around the floppy tip guidewire.  6-French  28 cm double-J stent was selected, passed over the indwelling guidewire  with what  appeared to be excellent pigtail formation, prompt efflux of  concentrated pink urine through the fenestrations of the double-J stent.  The patient was returned to recovery in satisfactory condition after  bladder had been drained, cystoscope had been removed.           ______________________________  Boston Service, M.D.     RH/MEDQ  D:  04/27/2007  T:  04/27/2007  Job:  [id number]   cc:   Evelena Peat, M.D.

## 2010-12-17 NOTE — Consult Note (Signed)
[name], [name]                  ACCOUNT NO.:  [id number]   MEDICAL RECORD NO.:  [id number]          PATIENT TYPE:  OBV   LOCATION:  0098                         FACILITY:  [facility]   PHYSICIAN:  Excell Seltzer. Annabell Howells, M.D.    DATE OF BIRTH:  08/15/1960   DATE OF CONSULTATION:  01/09/2009  DATE OF DISCHARGE:                                 CONSULTATION   HISTORY:  Briefly, Jamorion is a 50 year old white male who had left renal  lithotripsy on Dec 25, 2008.  He came to the emergency room this morning  with severe left flank pain that was slow to respond to narcotics and  Toradol.  CT scan revealed a 6.5 mm left proximal ureteral stone with  hydronephrosis.  After discussing the options, it was felt that  ureteroscopy was indicated.   ALLERGIES:  MERTHIOLATE and MORPHINE DERIVATIVES.   CURRENT MEDICATIONS:  1. Actos.  2. Aspirin.  3. Ativan.  4. Celebrex.  5. Fiber caps.  6. Fish oil.  7. Flexeril.  8. Multivitamin.  9. Oxycodone.  10.Patanol drops.  11.Vytorin.  12.Zyrtec.  13.Prednisone.   PAST MEDICAL HISTORY:  1. Pertinent for anxiety.  2. Arthritis.  3. Depression.  4. Diabetes mellitus.  5. Hypercholesterolemia.  6. Hypertension.   PAST SURGICAL HISTORY:  1. Pertinent for back surgery.  2. Two prior lithotripsies.  3. Prior ureteroscopy basket extraction.  4. History of pancreatic surgery for an endocrine tumor.  5. He has had a vasectomy.  6. Tonsillectomy.   SOCIAL HISTORY:  Significant for minimal alcohol use.  He denies tobacco  use.  He is married.   FAMILY HISTORY:  Unremarkable.   REVIEW OF SYSTEMS:  He has flank pain, nausea without vomiting, but no  chest pain, shortness of breath and is otherwise without complaints.   PHYSICAL EXAMINATION:  VITAL SIGNS:  Blood pressure 142/81, heart rate  69, temperature 97.4, respirations 18.  GENERAL:  He is an ill-  appearing, well-developed white male in no acute distress.  LUNGS:  Clear to auscultation.  HEART:   Regular rate and rhythm.  ABDOMEN:  Soft, obese, left CVA tenderness.   DIAGNOSTICS:  CT scan was reviewed.  Prior office notes were reviewed.   IMPRESSION:  Obstructing left ureteral fragment following lithotripsy.   PLAN:  He will need left ureteroscopic stone extraction with possible  stent.  The risks of the procedure were explained in detail.      Excell Seltzer. Annabell Howells, M.D.  Electronically Signed     JJW/MEDQ  D:  01/09/2009  T:  01/09/2009  Job:  [id number]   cc:   Maretta Bees. Vonita Moss, M.D.  Fax: [phone]

## 2010-12-20 NOTE — Discharge Summary (Signed)
[name], [name]                  ACCOUNT NO.:  [id number]   MEDICAL RECORD NO.:  [id number]          PATIENT TYPE:  INP   LOCATION:  1523                         FACILITY:  [facility]   PHYSICIAN:  Angelia Mould. Derrell Lolling, M.D.DATE OF BIRTH:  1961-05-09   DATE OF ADMISSION:  08/14/2008  DATE OF DISCHARGE:  08/18/2008                               DISCHARGE SUMMARY   FINAL DIAGNOSES:  1. Well-differentiated endocrine tumor of pancreas.  2. Type 2 diabetes.  3. Obesity.  4. Left ventricular hypertrophy by ultrasound.   OPERATION PERFORMED:  Resection of endocrine tumor of pancreas.  Date of  surgery was August 14, 2008.   HISTORY:  This is a 50 year old white man who has been treated for  kidney stones.  He had been complaining of left upper quadrant abdominal  pain for about 6 months, which was intermittent, exacerbated by walking,  eating large meal and no nausea or vomiting.  Dr. Vonita Moss performed a  CT scan on June 20, 2008 and this was compared to a CT scan of  April 16, 2007 and apparently there was soft tissue density  associated with the anterior and inferior aspect of the body of the  pancreas measuring 4 x 5 cm.  This looked fairly exophytic.  There was  no adenopathy.  He was concerned about the etiology of this.  I  evaluated the patient as an outpatient.  The patient was referred to Dr.  Rob Bunting who performed endoscopic ultrasound guided biopsy of the  mass, he described a 4.2 cm mixed solid and cystic lesion involving the  neck of the pancreas, mostly exophytic.  There was no involvement of the  major vascular structures.  No adenopathy was seen.  Fine needle  aspiration cytology suggested pancreatic islet cell tumor.  A PET CT  that showed mild metabolic activity of this mass but no other abnormal  activity.  Blood work studies were done for gastrointestinal hormones  and tumor markers and they were all unremarkable.  He was scheduled for  surgery.   HOSPITAL COURSE:  On day of admission, the patient taken to the  operating room and workup abdominal exploration.  I found no signs of  any metastatic disease anywhere.  He had a very well-circumscribed  exophytic tumor emanating from the anterior and inferior aspect of the  pancreas just to the left of the superior mesenteric artery and portal  vein.  This was attached by a small attachment to the pancreas.  We  resected this by using the Harmonic scalpel and going into the  pancreatic tissue.  This was not involving the pancreatic duct and all.  Final pathology report showed a well-differentiated endocrine tumor.  No  evidence of angiolymphatic invasion or perineural invasion.  The tumor  appeared to come down to the pancreas and focally extended to an inked  margin in one small area.  We discussed this case at GI tumor board and  everyone felt that nothing further needed to be done, rather than annual  follow-up for several years by CT.  Postoperatively, the patient did  very well, had no complications.  We monitored his blood sugars, they  were under good control with sliding scale insulin.  [name]  pathology report was discussed with him.  He advanced in his diet and  activities and resolved his ileus and was discharged on January 15.  At  that time he was tolerating a solid diet and was having bowel movements  and  wanted to go home.  His drain was minimal and we removed the drain.  He  was given a prescription for Tylox for pain and he was advised to  continue his usual medications, which include aspirin, Celebrex,  Vytorin, Actos, and Zyrtec.  He was asked to return to see me in 4-7  days.Angelia Mould. Derrell Lolling, M.D.  Electronically Signed     HMI/MEDQ  D:  09/19/2008  T:  09/19/2008  Job:  [id number]   cc:   Maretta Bees. Vonita Moss, M.D.  Fax: [phone]   Evelena Peat, M.D.   Rachael Fee, MD  [address]  [place], [place] [address]

## 2010-12-26 ENCOUNTER — Other Ambulatory Visit (INDEPENDENT_AMBULATORY_CARE_PROVIDER_SITE_OTHER): Payer: BC Managed Care – PPO

## 2010-12-26 DIAGNOSIS — Z0000 Encounter for general adult medical examination without abnormal findings: Secondary | ICD-10-CM

## 2010-12-26 LAB — HEPATIC FUNCTION PANEL
ALT: 14 U/L (ref 0–53)
Bilirubin, Direct: 0.2 mg/dL (ref 0.0–0.3)
Total Protein: 7.2 g/dL (ref 6.0–8.3)

## 2010-12-26 LAB — CBC WITH DIFFERENTIAL/PLATELET
Basophils Relative: 0.4 % (ref 0.0–3.0)
Eosinophils Relative: 2.6 % (ref 0.0–5.0)
HCT: 45 % (ref 39.0–52.0)
Hemoglobin: 15.5 g/dL (ref 13.0–17.0)
Lymphs Abs: 1.8 10*3/uL (ref 0.7–4.0)
MCV: 92.9 fl (ref 78.0–100.0)
Monocytes Absolute: 0.4 10*3/uL (ref 0.1–1.0)
Neutro Abs: 3.2 10*3/uL (ref 1.4–7.7)
Neutrophils Relative %: 57.8 % (ref 43.0–77.0)
RBC: 4.85 Mil/uL (ref 4.22–5.81)
WBC: 5.6 10*3/uL (ref 4.5–10.5)

## 2010-12-26 LAB — BASIC METABOLIC PANEL
Chloride: 108 mEq/L (ref 96–112)
Potassium: 5.4 mEq/L — ABNORMAL HIGH (ref 3.5–5.1)
Sodium: 143 mEq/L (ref 135–145)

## 2010-12-26 LAB — POCT URINALYSIS DIPSTICK
Bilirubin, UA: NEGATIVE
Glucose, UA: NEGATIVE
Leukocytes, UA: NEGATIVE
Nitrite, UA: NEGATIVE

## 2010-12-26 LAB — LIPID PANEL
Cholesterol: 167 mg/dL (ref 0–200)
Total CHOL/HDL Ratio: 3

## 2010-12-26 LAB — MICROALBUMIN / CREATININE URINE RATIO: Microalb, Ur: 2.4 mg/dL — ABNORMAL HIGH (ref 0.0–1.9)

## 2010-12-26 LAB — TSH: TSH: 2.17 u[IU]/mL (ref 0.35–5.50)

## 2011-01-07 ENCOUNTER — Encounter: Payer: Self-pay | Admitting: Family Medicine

## 2011-01-07 ENCOUNTER — Ambulatory Visit (INDEPENDENT_AMBULATORY_CARE_PROVIDER_SITE_OTHER): Payer: BC Managed Care – PPO | Admitting: Family Medicine

## 2011-01-07 VITALS — BP 122/88 | HR 80 | Temp 98.7°F | Resp 12 | Ht 74.5 in | Wt 289.0 lb

## 2011-01-07 DIAGNOSIS — Z0000 Encounter for general adult medical examination without abnormal findings: Secondary | ICD-10-CM

## 2011-01-07 DIAGNOSIS — E785 Hyperlipidemia, unspecified: Secondary | ICD-10-CM

## 2011-01-07 DIAGNOSIS — E119 Type 2 diabetes mellitus without complications: Secondary | ICD-10-CM

## 2011-01-07 DIAGNOSIS — L259 Unspecified contact dermatitis, unspecified cause: Secondary | ICD-10-CM

## 2011-01-07 MED ORDER — ATORVASTATIN CALCIUM 20 MG PO TABS: 20.0000 mg | ORAL_TABLET | Freq: Every day | ORAL | Status: DC

## 2011-01-07 MED ORDER — PREDNISONE 10 MG PO TABS: ORAL_TABLET | ORAL | Status: AC

## 2011-01-07 NOTE — Progress Notes (Signed)
  Subjective:    Patient ID: [name], male    DOB: [birth year], 50 y.o.   MRN: [id number]  HPI Here for complete physical examination. Just turned 50 this year. No history of screening colonoscopy. Tetanus 2008. Has been exercising consistently but has had some recent weight gain.  Acute issue of contact dermatitis left ankle. History of multiple recurrences in the past. Requesting prednisone therapy.  Hyperlipidemia treated with Vytorin. Recent LDL 112 not to goal. No prior history of other statin use.  Type 2 diabetes. Has previously been on Actos. We discussed switching to metformin. No contraindications. Blood sugars have been fairly well controlled   Review of Systems  Constitutional: Negative for fever, activity change, appetite change and fatigue.  HENT: Negative for ear pain, congestion and trouble swallowing.   Eyes: Negative for pain and visual disturbance.  Respiratory: Negative for cough, shortness of breath and wheezing.   Cardiovascular: Negative for chest pain and palpitations.  Gastrointestinal: Negative for nausea, vomiting, abdominal pain, diarrhea, constipation, blood in stool, abdominal distention and rectal pain.  Genitourinary: Negative for dysuria, hematuria and testicular pain.  Musculoskeletal: Negative for joint swelling and arthralgias.  Skin: Positive for rash.  Neurological: Negative for dizziness, syncope and headaches.  Hematological: Negative for adenopathy.  Psychiatric/Behavioral: Negative for confusion and dysphoric mood.       Objective:   Physical Exam  Constitutional: He is oriented to person, place, and time. He appears well-developed and well-nourished. No distress.  HENT:  Head: Normocephalic and atraumatic.  Right Ear: External ear normal.  Left Ear: External ear normal.  Mouth/Throat: Oropharynx is clear and moist.  Eyes: Conjunctivae and EOM are normal. Pupils are equal, round, and reactive to light.  Neck: Normal range of motion.  Neck supple. No thyromegaly present.  Cardiovascular: Normal rate, regular rhythm and normal heart sounds.   No murmur heard. Pulmonary/Chest: Breath sounds normal. No respiratory distress. He has no wheezes. He has no rales.  Abdominal: Soft. Bowel sounds are normal. He exhibits no distension and no mass. There is no tenderness. There is no rebound and no guarding.  Musculoskeletal: He exhibits no edema.  Lymphadenopathy:    He has no cervical adenopathy.  Neurological: He is alert and oriented to person, place, and time. He displays normal reflexes. No cranial nerve deficit.  Skin: Rash noted.       Erythematous, slightly raised vesicular rash L ankle which is nontender.  Psychiatric: He has a normal mood and affect.          Assessment & Plan:  #1 complete physical examination. Work on weight loss. Labs reviewed. Tetanus up-to-date. Schedule screening colonoscopy #2 hyperlipidemia. Not to goal. Change to Lipitor 20 mg daily and recheck labs in about 3 months #3 contact dermatitis left ankle. Prednisone taper and monitor blood sugars closely #4 type 2 diabetes. Discontinue Actos. Start metformin 500 mg twice daily

## 2011-01-23 ENCOUNTER — Ambulatory Visit (INDEPENDENT_AMBULATORY_CARE_PROVIDER_SITE_OTHER): Payer: BC Managed Care – PPO | Admitting: Family Medicine

## 2011-01-23 VITALS — BP 129/87

## 2011-01-23 DIAGNOSIS — M75102 Unspecified rotator cuff tear or rupture of left shoulder, not specified as traumatic: Secondary | ICD-10-CM

## 2011-01-23 DIAGNOSIS — S43429A Sprain of unspecified rotator cuff capsule, initial encounter: Secondary | ICD-10-CM

## 2011-01-23 NOTE — Progress Notes (Signed)
  Subjective:    Patient ID: [name], male    DOB: 03-24-1961, 50 y.o.   MRN: [id number]  HPI 50 year old right-hand-dominant male referred to our office by Dr. Thurston Hole for ultrasound evaluation of left shoulder to rule out rotator cuff tear. Patient with history of nearly full-thickness tear of the supraspinatus, infraspinatus tendinopathy, and posterior labral tear seen on MRI 11/07/09, underwent shoulder arthroscopy by Dr. Thurston Hole November 2011 for partial rotator cuff debridement and partial labral debridement, subacromial decompression, and distal clavicle excision. He has had persistent pain since the surgery and has had 3 cortisone injections in that time period without improvement. He has been doing physical therapy and has also been on Celebrex. Patient is extremely claustrophobic and was sent to [place] for ultrasound evaluation.  PMH: Left ventricular hypertrophy without ischemia, diabetes, seasonal allergies PSH: Left shoulder arthroscopy by Dr. Thurston Hole 06/2010 with partial rotator cuff debridement, partial labral debridement, subacromial decompression, distal clavicle excision MEDS: Vytorin, Actos, potassium, Zyrtec, aspirin, Centrum, Patanol when necessary, trazodone when necessary, Ativan when necessary SOCIAL: Denies any tobacco use, drinks alcohol rarely, employed as a Production designer, theatre/television/film at [employer] FAMILY: Family history significant for diabetes   Review of Systems Per history of present illness, otherwise negative    Objective:   Physical Exam GENERAL: Alert oriented x3, no acute distress, pleasant MSK: - C-spine: Full range of motion without pain. No midline or paraspinal tenderness -Shoulders: Left shoulder with full range of motion with pain. Tender to palpation over her a.c. joint and over the supraspinatus. Mild tenderness over bicipital groove. Positive empty can, negative Hawkins, negative Neer negative speed's. Rotator cuff weakness +4/5 with external rotation and abduction,  otherwise good strength. Good liftoff test. Right shoulder full range of motion without pain, swelling, tenderness, weakness. Neurovascularly intact distally  MSK ultrasound: Right shoulder- normal-appearing biceps tendon. Subscapularis was small area of hypoechoic change at insertion with small area of surrounding calcification consistent with partial tear, no increased Doppler flow appreciated. Supraspinatus with area of linear calcifications consistent with healed partial tear, small area of hypoechoic change near this area consistent with persistent partial tearing, no increased Doppler flow is appreciated. Infraspinatus with small area of hypoechoic change at insertion and small area of surrounding calcification consistent with partial tear, no increased Doppler flow. A.c. joint with postop changes appreciated. No dynamic impingement appreciated. Images saved.       Assessment & Plan:

## 2011-01-23 NOTE — Assessment & Plan Note (Signed)
Left shoulder pain with MSK ultrasound showing partial tearing of the subscap, supraspinatus, and infraspinatus. No full thickness tears appreciated. - Subscapularis with small partial tear at the tendinous insertion, some areas of surrounding calcification indicative of healing. - Supraspinatus with linear calcifications and small partial tear noted in that area. - Infraspinatus with small partial tear at the tendinous insertion and some surrounding calcification in that area as well. - Patient is to follow up with Dr. Thurston Hole next week as previously scheduled.

## 2011-01-24 ENCOUNTER — Encounter: Payer: Self-pay | Admitting: Gastroenterology

## 2011-02-26 HISTORY — PX: OTHER SURGICAL HISTORY: SHX169

## 2011-03-03 ENCOUNTER — Ambulatory Visit (AMBULATORY_SURGERY_CENTER): Payer: BC Managed Care – PPO | Admitting: *Deleted

## 2011-03-03 VITALS — Ht 75.0 in | Wt 290.4 lb

## 2011-03-03 DIAGNOSIS — Z1211 Encounter for screening for malignant neoplasm of colon: Secondary | ICD-10-CM

## 2011-03-03 NOTE — Progress Notes (Signed)
Pt here for PV for screening colonoscopy.  He had left rotator cuff surgery 02/26/2011.  He is in a sling which he will need to wear for 1 month, followed by physical therapy.  He will still be in sling and unable to lay on his left side at time of scheduled colon.  Colonoscopy cancelled.  Pt will call back to reschedule when he has been released from physical therapy. [name]

## 2011-03-17 ENCOUNTER — Other Ambulatory Visit: Payer: BC Managed Care – PPO | Admitting: Gastroenterology

## 2011-05-12 ENCOUNTER — Ambulatory Visit (AMBULATORY_SURGERY_CENTER): Payer: BC Managed Care – PPO | Admitting: *Deleted

## 2011-05-12 VITALS — Ht 75.0 in | Wt 280.1 lb

## 2011-05-12 DIAGNOSIS — Z1211 Encounter for screening for malignant neoplasm of colon: Secondary | ICD-10-CM

## 2011-05-12 MED ORDER — PEG-KCL-NACL-NASULF-NA ASC-C 100 G PO SOLR: ORAL | Status: DC

## 2011-05-15 ENCOUNTER — Other Ambulatory Visit: Payer: Self-pay | Admitting: *Deleted

## 2011-05-15 LAB — POCT HEMOGLOBIN-HEMACUE
Hemoglobin: 15.3
Operator id: 268271

## 2011-05-15 MED ORDER — GLUCOSE BLOOD VI STRP: ORAL_STRIP | Status: AC

## 2011-05-16 ENCOUNTER — Other Ambulatory Visit: Payer: Self-pay | Admitting: *Deleted

## 2011-05-16 MED ORDER — ONETOUCH ULTRASOFT LANCETS MISC: Status: AC

## 2011-05-19 ENCOUNTER — Telehealth: Payer: Self-pay | Admitting: Family Medicine

## 2011-05-19 ENCOUNTER — Telehealth: Payer: Self-pay | Admitting: *Deleted

## 2011-05-19 DIAGNOSIS — Z09 Encounter for follow-up examination after completed treatment for conditions other than malignant neoplasm: Secondary | ICD-10-CM

## 2011-05-19 MED ORDER — POTASSIUM CITRATE ER 10 MEQ (1080 MG) PO TBCR: 10.0000 meq | EXTENDED_RELEASE_TABLET | Freq: Four times a day (QID) | ORAL | Status: DC

## 2011-05-19 NOTE — Telephone Encounter (Signed)
Pt will sch bmp. Pt wife would like to add a1c and lipid to order. Can I sch?

## 2011-05-19 NOTE — Telephone Encounter (Signed)
Wife notified of Dr. Lucie Leather recommendations.

## 2011-05-19 NOTE — Telephone Encounter (Signed)
yes

## 2011-05-19 NOTE — Telephone Encounter (Signed)
Wife is calling for a refill of K 10 meq 2 bid.  Originally prescribed by another MD.

## 2011-05-19 NOTE — Telephone Encounter (Signed)
OK to refill for 3 months but I suggest repeat BMP within one week.  Last K slightly high back in May.

## 2011-05-20 NOTE — Telephone Encounter (Signed)
[name] please put order in for labs a1c,bmp and lipid. Pt is sch for 05-26-11.

## 2011-05-21 ENCOUNTER — Ambulatory Visit (AMBULATORY_SURGERY_CENTER): Payer: BC Managed Care – PPO | Admitting: Gastroenterology

## 2011-05-21 ENCOUNTER — Encounter: Payer: Self-pay | Admitting: Gastroenterology

## 2011-05-21 VITALS — BP 121/55 | HR 65 | Temp 97.5°F | Resp 18 | Ht 75.0 in | Wt 280.0 lb

## 2011-05-21 DIAGNOSIS — D126 Benign neoplasm of colon, unspecified: Secondary | ICD-10-CM

## 2011-05-21 DIAGNOSIS — R933 Abnormal findings on diagnostic imaging of other parts of digestive tract: Secondary | ICD-10-CM

## 2011-05-21 DIAGNOSIS — Z1211 Encounter for screening for malignant neoplasm of colon: Secondary | ICD-10-CM

## 2011-05-21 DIAGNOSIS — K635 Polyp of colon: Secondary | ICD-10-CM

## 2011-05-21 MED ORDER — SODIUM CHLORIDE 0.9 % IV SOLN: 500.0000 mL | INTRAVENOUS | Status: DC

## 2011-05-21 NOTE — Patient Instructions (Signed)
Discharge instructions given with verbal understanding.  Handouts on polyps given.  Please refer to the blue and neon green sheets for instructions regarding diet and activity for the rest of today.

## 2011-05-22 ENCOUNTER — Telehealth: Payer: Self-pay | Admitting: *Deleted

## 2011-05-22 ENCOUNTER — Telehealth: Payer: Self-pay

## 2011-05-22 ENCOUNTER — Other Ambulatory Visit: Payer: Self-pay | Admitting: Gastroenterology

## 2011-05-22 DIAGNOSIS — K629 Disease of anus and rectum, unspecified: Secondary | ICD-10-CM

## 2011-05-22 NOTE — Telephone Encounter (Signed)
Dr. Derrell Lolling on 06/10/11 @ 10:00am, arrival time is 9:30am Elane Fritz to notify

## 2011-05-22 NOTE — Telephone Encounter (Signed)
No mess. Left on home # , and cell # had been disconnected.

## 2011-05-26 ENCOUNTER — Other Ambulatory Visit (INDEPENDENT_AMBULATORY_CARE_PROVIDER_SITE_OTHER): Payer: BC Managed Care – PPO

## 2011-05-26 DIAGNOSIS — E785 Hyperlipidemia, unspecified: Secondary | ICD-10-CM

## 2011-05-26 DIAGNOSIS — Z09 Encounter for follow-up examination after completed treatment for conditions other than malignant neoplasm: Secondary | ICD-10-CM

## 2011-05-26 LAB — LIPID PANEL
Cholesterol: 175 mg/dL (ref 0–200)
LDL Cholesterol: 89 mg/dL (ref 0–99)
Total CHOL/HDL Ratio: 4
VLDL: 39.6 mg/dL (ref 0.0–40.0)

## 2011-05-26 LAB — HEPATIC FUNCTION PANEL
Alkaline Phosphatase: 43 U/L (ref 39–117)
Bilirubin, Direct: 0 mg/dL (ref 0.0–0.3)
Total Protein: 7.5 g/dL (ref 6.0–8.3)

## 2011-05-27 ENCOUNTER — Other Ambulatory Visit: Payer: Self-pay | Admitting: *Deleted

## 2011-05-27 MED ORDER — OLOPATADINE HCL 0.1 % OP SOLN: 1.0000 [drp] | Freq: Two times a day (BID) | OPHTHALMIC | Status: DC

## 2011-05-28 NOTE — Progress Notes (Signed)
Quick Note:  Pt informed ______ 

## 2011-06-06 ENCOUNTER — Encounter: Payer: Self-pay | Admitting: Family Medicine

## 2011-06-06 ENCOUNTER — Ambulatory Visit (INDEPENDENT_AMBULATORY_CARE_PROVIDER_SITE_OTHER): Payer: BC Managed Care – PPO | Admitting: Family Medicine

## 2011-06-06 DIAGNOSIS — E119 Type 2 diabetes mellitus without complications: Secondary | ICD-10-CM

## 2011-06-06 DIAGNOSIS — E785 Hyperlipidemia, unspecified: Secondary | ICD-10-CM

## 2011-06-06 NOTE — Progress Notes (Signed)
  Subjective:    Patient ID: [name], male    DOB: [birth year], 50 y.o.   MRN: [id number]  HPI  Medical followup. Hyperlipidemia and type 2 diabetes. Recent labs obtained. A1c 5.7%. Only taking metformin 500 mg once daily. Blood sugars stable. Inquiring whether he can stop medication at this time. Lipids at goal. LDL 89. Compliant with antilipid therapy. Somewhat inconsistent exercise.  A colonoscopy a few days ago with benign polyps. No complications.   Review of Systems  Constitutional: Negative for appetite change and unexpected weight change.  Respiratory: Negative for cough and shortness of breath.   Cardiovascular: Negative for chest pain.  Neurological: Negative for dizziness and headaches.       Objective:   Physical Exam  Constitutional: He is oriented to person, place, and time. He appears well-developed and well-nourished. No distress.  Cardiovascular: Normal rate and regular rhythm.   No murmur heard. Pulmonary/Chest: Effort normal and breath sounds normal. No respiratory distress. He has no wheezes. He has no rales.  Musculoskeletal: He exhibits no edema.  Neurological: He is alert and oriented to person, place, and time.          Assessment & Plan:  #1 type 2 diabetes. Excellent control. Try discontinuing metformin and close monitoring #2 dyslipidemia. Lipids at goal. Continue Vytorin

## 2011-06-10 ENCOUNTER — Other Ambulatory Visit (INDEPENDENT_AMBULATORY_CARE_PROVIDER_SITE_OTHER): Payer: Self-pay | Admitting: General Surgery

## 2011-06-10 ENCOUNTER — Ambulatory Visit (INDEPENDENT_AMBULATORY_CARE_PROVIDER_SITE_OTHER): Payer: BC Managed Care – PPO | Admitting: General Surgery

## 2011-06-10 ENCOUNTER — Encounter (INDEPENDENT_AMBULATORY_CARE_PROVIDER_SITE_OTHER): Payer: Self-pay | Admitting: General Surgery

## 2011-06-10 VITALS — BP 148/88 | HR 72 | Temp 97.4°F | Resp 16 | Ht 75.0 in | Wt 284.0 lb

## 2011-06-10 DIAGNOSIS — K602 Anal fissure, unspecified: Secondary | ICD-10-CM

## 2011-06-10 DIAGNOSIS — K430 Incisional hernia with obstruction, without gangrene: Secondary | ICD-10-CM

## 2011-06-10 NOTE — Progress Notes (Signed)
Chief Complaint  Patient presents with  . Routine Post Op    Have lesion removed. Referred by Dr. Christella Hartigan.    HPI [name] is a 50 y.o. male.    The patient is referred back to me by Dr. Rob Bunting because of an anal canal mass. The patient also was thought that his ventral hernia  The patient had a recent colonoscopy and a couple of benign polyps were removed. Dr. Christella Hartigan also noticed a soft nodule in the anal canal and the the patient was referred to me for excision of this at some point.  The patient gives a history of being told that he had an anal fissure in the past. He will have a little bit of bleeding once a month if he has a hard bowel movement, but he doesn't have much pain. He has never had any rectal surgery.  Past history is significant for the partial pancreatectomy for a neuroendocrine tumor on August 14, 2008. This was a 4.9 cm tumor and his last staging evaluation was negative except for a stable right liver nodule which is thought to be a hemangioma. He does have a small ventral hernia in the midline above the umbilicus. He says it is now hurts him when he does situps. His ejection having this repaired this year. HPI  Past Medical History  Diagnosis Date  . DIAB W/O COMP TYPE II/UNS NOT STATED UNCNTRL 03/30/2009  . SHOULDER PAIN, LEFT 10/20/2008  . BURSITIS, LEFT HIP 05/20/2010  . ELEVATED BLOOD PRESSURE 03/30/2009  . Hyperlipidemia 10/11/2010  . Arthritis   . Cancer 2010    pancreatic cancer  . Fissure, anal     occ bleeds still  . Chronic kidney disease     stones    Past Surgical History  Procedure Date  . Lipoma excision   . Biopsy pancreas 2010    benign  . Kidney stones 2010 & 2008 & 2005  . Back surgery 2000  . Vasectomy 1994  . Tonsillectomy 1967  . Pancreatic tumor 2010  . Right rotator cuff     2011  . Left rotator cuff 02/26/2011  . Left shoulder bone spurs 2011    Family History  Problem Relation Age of Onset  . Diabetes Brother   .  Stroke Father   . Heart disease Father   . Colon cancer Maternal Grandfather   . Cancer Maternal Grandfather     colon    Social History History  Substance Use Topics  . Smoking status: Never Smoker   . Smokeless tobacco: Not on file  . Alcohol Use: Yes     VERY LITTLE    Allergies  Allergen Reactions  . Merthiolate (Thimerosol)     Unknown reaction  . Morphine     REACTION: paronia    Current Outpatient Prescriptions  Medication Sig Dispense Refill  . aspirin 325 MG tablet Take 325 mg by mouth daily.        Marland Kitchen atorvastatin (LIPITOR) 20 MG tablet 20 mg daily.       . CELEBREX 200 MG capsule 200 mg as needed.       . cetirizine (ZYRTEC) 10 MG tablet Take 10 mg by mouth daily.        Marland Kitchen FIBER PO Take by mouth.        Marland Kitchen glucose blood (ONE TOUCH TEST STRIPS) test strip Use as instructed  100 each  12  . Lancets (ONETOUCH ULTRASOFT) lancets Use as instructed  100 each  12  . LORazepam (ATIVAN) 1 MG tablet Take 1 mg by mouth every 8 (eight) hours. Only takes when flying       . metFORMIN (GLUCOPHAGE) 500 MG tablet Take 1 tablet (500 mg total) by mouth 2 (two) times daily with a meal.  60 tablet  11  . Multiple Vitamins-Minerals (CENTRUM PO) Take by mouth daily.       Marland Kitchen olopatadine (PATANOL) 0.1 % ophthalmic solution Place 1 drop into both eyes 2 (two) times daily.  5 mL  11  . potassium citrate (UROCIT-K) 10 MEQ (1080 MG) SR tablet Take 1 tablet (10 mEq total) by mouth QID.  120 tablet  2  . VYTORIN 10-20 MG per tablet         Review of Systems Review of Systems  Constitutional: Negative for fever, chills and unexpected weight change.  HENT: Negative for hearing loss, congestion, sore throat, trouble swallowing and voice change.   Eyes: Negative for visual disturbance.  Respiratory: Negative for cough and wheezing.   Cardiovascular: Negative for chest pain, palpitations and leg swelling.  Gastrointestinal: Positive for abdominal pain and anal bleeding. Negative for nausea,  vomiting, diarrhea, constipation, blood in stool, abdominal distention and rectal pain.  Genitourinary: Negative for hematuria and difficulty urinating.  Musculoskeletal: Negative for arthralgias.  Skin: Negative for rash and wound.  Neurological: Negative for seizures, syncope, weakness and headaches.  Hematological: Negative for adenopathy. Does not bruise/bleed easily.  Psychiatric/Behavioral: Negative for confusion.    Blood pressure 148/88, pulse 72, temperature 97.4 F (36.3 C), temperature source Temporal, resp. rate 16, height 6\' 3"  (1.905 m), weight 284 lb (128.822 kg).  Physical Exam Physical Exam  Constitutional: He is oriented to person, place, and time. He appears well-developed and well-nourished. No distress.       obese  HENT:  Head: Normocephalic.  Nose: Nose normal.  Mouth/Throat: No oropharyngeal exudate.  Eyes: Conjunctivae and EOM are normal. Pupils are equal, round, and reactive to light. Right eye exhibits no discharge. Left eye exhibits no discharge. No scleral icterus.  Neck: Normal range of motion. Neck supple. No JVD present. No tracheal deviation present. No thyromegaly present.  Cardiovascular: Normal rate, regular rhythm, normal heart sounds and intact distal pulses.   No murmur heard. Pulmonary/Chest: Effort normal and breath sounds normal. No stridor. No respiratory distress. He has no wheezes. He has no rales. He exhibits no tenderness.  Abdominal: Soft. Bowel sounds are normal. He exhibits no distension and no mass. There is no tenderness. There is no rebound and no guarding.    Genitourinary:     Musculoskeletal: Normal range of motion. He exhibits no edema and no tenderness.  Lymphadenopathy:    He has no cervical adenopathy.  Neurological: He is alert and oriented to person, place, and time. He has normal reflexes. Coordination normal.  Skin: Skin is warm and dry. No rash noted. He is not diaphoretic. No erythema. No pallor.  Psychiatric: He  has a normal mood and affect. His behavior is normal. Judgment and thought content normal.    Data Reviewed I reviewed all of my old office records. I reviewed Dr. Wendall Papa colonoscopy report. I reviewed his CT scan from Dec 03, 2010.  Assessment    Ventral incisional hernia, possibly partially incarcerated, midline, above umbilicus. Symptomatic. Patient  desires repair  Anal canal mass, posterior midline. 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  Well-differentiated neuroendocrine  tumor of the pancreas, probably benign, no evidence of recurrence almost 3 years postop partial pancreatectomy.  Obesity  History of kidney stones  Hemangioma of right lobe of liver, stable    Plan    I told the patient that we should not do rectal surgery simultaneous with the ventral hernia repair because of the implantation of synthetic mesh, and he understands that. He desires that we proceed with a ventral hernia first, and that we delay the anal fissure and anal canal mass excision until January.  I have discussed the indications and details of surgical repair of his ventral incisional hernia with him. Risks and indications have been outlined, including but limited to bleeding, infection, conversion to open laparotomy, recurrence of the hernia, injury to adjacent organs such as the intestine with major reconstructive surgery, cardiac pulmonary and thromboembolic problems. He seems to understand these issues well. Aall of his questions were answered. He is in agreement with this plan.  Orders are written to schedule him for elective laparoscopic repair of ventral incisional hernia with mesh, possible open.       Kevyn Wengert M 06/10/2011, 10:34 AM

## 2011-06-10 NOTE — Patient Instructions (Signed)
Your ventral incisional hernia looks about the same. I think some fatty tissue may be trapped in there but not much. We are going to schedule you for elective repair of this hernia with mesh. Hopefully we will be able to do this laparoscopically. You also have a mass in your anal canal which may be a chronic inflammatory polyp related to your anal fissure. We discussed different logistics to get both surgical problems taking care. It was your decision to have a ventral hernia repaired this year, and to proceed with excision of the anal canal polyp and repaired her anal fissure early next year.  Hernia Repair with Laparoscope A hernia occurs when an internal organ pushes out through a weak spot in the belly (abdominal) wall muscles. Hernias most commonly occur in the groin and around the navel. Hernias can also occur through a cut by the surgeon (incision) after an abdominal operation. A hernia may be caused by:  Lifting heavy objects.   Prolonged coughing.   Straining to move your bowels.  Hernias can often be pushed back into place (reduced). Most hernias tend to get worse over time. Problems occur when abdominal contents get stuck in the opening and the blood supply is blocked or impaired (incarcerated hernia). Because of these risks, you require surgery to repair the hernia. Your hernia will be repaired using a laparoscope. Laparoscopic surgery is a type of minimally invasive surgery. It does not involve making a typical surgical cut (incision) in the skin. A laparoscope is a telescope-like rod and lens system. It is usually connected to a video camera and a light source so your caregiver can clearly see the operative area. The instruments are inserted through  to  inch (5 mm or 10 mm) openings in the skin at specific locations. A working and viewing space is created by blowing a small amount of carbon dioxide gas into the abdominal cavity. The abdomen is essentially blown up like a balloon  (insufflated). This elevates the abdominal wall above the internal organs like a dome. The carbon dioxide gas is common to the human body and can be absorbed by tissue and removed by the respiratory system. Once the repair is completed, the small incisions will be closed with either stitches (sutures) or staples (just like a paper stapler only this staple holds the skin together). LET YOUR CAREGIVERS KNOW ABOUT:  Allergies.   Medications taken including herbs, eye drops, over the counter medications, and creams.   Use of steroids (by mouth or creams).   Previous problems with anesthetics or Novocaine.   Possibility of pregnancy, if this applies.   History of blood clots (thrombophlebitis).   History of bleeding or blood problems.   Previous surgery.   Other health problems.  BEFORE THE PROCEDURE  Laparoscopy can be done either in a hospital or out-patient clinic. You may be given a mild sedative to help you relax before the procedure. Once in the operating room, you will be given a general anesthesia to make you sleep (unless you and your caregiver choose a different anesthetic).  AFTER THE PROCEDURE  After the procedure you will be watched in a recovery area. Depending on what type of hernia was repaired, you might be admitted to the hospital or you might go home the same day. With this procedure you may have less pain and scarring. This usually results in a quicker recovery and less risk of infection. HOME CARE INSTRUCTIONS   Bed rest is not required. You may continue  your normal activities but avoid heavy lifting (more than 10 pounds) or straining.   Cough gently. If you are a smoker it is best to stop, as even the best hernia repair can break down with the continual strain of coughing.   Avoid driving until given the OK by your surgeon.   There are no dietary restrictions unless given otherwise.   TAKE ALL MEDICATIONS AS DIRECTED.   Only take over-the-counter or prescription  medicines for pain, discomfort, or fever as directed by your caregiver.  SEEK MEDICAL CARE IF:   There is increasing abdominal pain or pain in your incisions.   There is more bleeding from incisions, other than minimal spotting.   You feel light headed or faint.   You develop an unexplained fever, chills, and/or an oral temperature above 102 F (38.9 C).   You have redness, swelling, or increasing pain in the wound.   Pus coming from wound.   A foul smell coming from the wound or dressings.  SEEK IMMEDIATE MEDICAL CARE IF:   You develop a rash.   You have difficulty breathing.   You have any allergic problems.  MAKE SURE YOU:   Understand these instructions.   Will watch your condition.   Will get help right away if you are not doing well or get worse.  Document Released: 07/21/2005 Document Revised: 04/02/2011 Document Reviewed: 06/20/2009 [facility] Patient Information 2012 [place], [place].

## 2011-06-23 ENCOUNTER — Encounter (HOSPITAL_COMMUNITY): Payer: Self-pay | Admitting: Respiratory Therapy

## 2011-06-30 ENCOUNTER — Encounter (HOSPITAL_COMMUNITY): Payer: Self-pay

## 2011-06-30 ENCOUNTER — Encounter (HOSPITAL_COMMUNITY)
Admission: RE | Admit: 2011-06-30 | Discharge: 2011-06-30 | Disposition: A | Discharge status: Home / Self Care | Payer: BC Managed Care – PPO | Source: Ambulatory Visit | Attending: Anesthesiology | Admitting: Anesthesiology

## 2011-06-30 ENCOUNTER — Encounter (HOSPITAL_COMMUNITY)
Admission: RE | Admit: 2011-06-30 | Discharge: 2011-06-30 | Disposition: A | Discharge status: Home / Self Care | Payer: BC Managed Care – PPO | Source: Ambulatory Visit | Attending: General Surgery | Admitting: General Surgery

## 2011-06-30 ENCOUNTER — Other Ambulatory Visit: Payer: Self-pay

## 2011-06-30 LAB — BASIC METABOLIC PANEL
BUN: 19 mg/dL (ref 6–23)
CO2: 28 mEq/L (ref 19–32)
Calcium: 9.9 mg/dL (ref 8.4–10.5)
Chloride: 107 mEq/L (ref 96–112)
Creatinine, Ser: 0.74 mg/dL (ref 0.50–1.35)
GFR calc Af Amer: >90 mL/min (ref >90)
GFR calc non Af Amer: >90 mL/min (ref >90)
Glucose, Bld: 102 mg/dL — ABNORMAL HIGH (ref 70–99)
Potassium: 4.8 mEq/L (ref 3.5–5.1)
Sodium: 143 mEq/L (ref 135–145)

## 2011-06-30 LAB — SURGICAL PCR SCREEN: Staphylococcus aureus: POSITIVE — AB

## 2011-06-30 LAB — CBC
HCT: 45.9 % (ref 39.0–52.0)
Hemoglobin: 15.8 g/dL (ref 13.0–17.0)
RBC: 5.1 MIL/uL (ref 4.22–5.81)
RDW: 12.4 % (ref 11.5–15.5)
WBC: 6 10*3/uL (ref 4.0–10.5)

## 2011-06-30 NOTE — Progress Notes (Signed)
Chart reviewed.

## 2011-06-30 NOTE — Pre-Procedure Instructions (Signed)
20 [name]  06/30/2011   Your procedure is scheduled on: 12.04.12  Report to [facility] at 530 AM.  Call this number if you have problems the morning of surgery: [phone]   Remember:   Do not eat food:After Midnight.  May have clear liquids: up to 4 Hours before arrival.  Clear liquids include soda, tea, black coffee, apple or grape juice, broth.  Take these medicines the morning of surgery with A SIP OF WATER:none   Do not wear jewelry, make-up or nail polish.  Do not wear lotions, powders, or perfumes. You may wear deodorant.  Do not shave 48 hours prior to surgery.  Do not bring valuables to the hospital.  Contacts, dentures or bridgework may not be worn into surgery.  Leave suitcase in the car. After surgery it may be brought to your room.  For patients admitted to the hospital, checkout time is 11:00 AM the day of discharge.   Patients discharged the day of surgery will not be allowed to drive home.  Name and phone number of your driver: [name]  [phone]  Special Instructions: CHG Shower Use Special Wash: 1/2 bottle night before surgery and 1/2 bottle morning of surgery.   Please read over the following fact sheets that you were given: Pain Booklet, Coughing and Deep Breathing, MRSA Information and Surgical Site Infection Prevention

## 2011-07-02 ENCOUNTER — Other Ambulatory Visit: Payer: Self-pay | Admitting: *Deleted

## 2011-07-02 MED ORDER — CELECOXIB 200 MG PO CAPS: 200.0000 mg | ORAL_CAPSULE | Freq: Two times a day (BID) | ORAL | Status: DC

## 2011-07-07 ENCOUNTER — Other Ambulatory Visit (INDEPENDENT_AMBULATORY_CARE_PROVIDER_SITE_OTHER): Payer: Self-pay | Admitting: General Surgery

## 2011-07-07 MED ORDER — CEFAZOLIN SODIUM-DEXTROSE 2-3 GM-% IV SOLR
2.0000 g | INTRAVENOUS | Status: DC
  Filled 2011-07-07: qty 50

## 2011-07-07 NOTE — H&P (Signed)
[name]   06/10/2011 10:00 AM Office Visit  MRN: [id number]   Description: 50 year old male  Provider: Ernestene Mention, MD  Department: [facility]        Diagnoses     Incisional ventral hernia w obstruction   - Primary    552.21    Anal fissure     565.0      Reason for Visit     Routine Post Op    Have lesion removed. Referred by Dr. Christella Hartigan.        Vitals - Last Recorded       BP Pulse Temp(Src) Resp Ht Wt    148/88  72  97.4 F (36.3 C) (Temporal)  16  6\' 3"  (1.905 m)  284 lb (128.822 kg)          BMI              35.50 kg/m2                 Progress Notes     Ernestene Mention, MD  06/10/2011 10:45 AM  SignedChief Complaint   Patient presents with   .  Routine Post Op       Have lesion removed. Referred by Dr. Christella Hartigan.      HPI [name] is a 50 y.o. male.     The patient is referred back to me by Dr. Rob Bunting because of an anal canal mass. The patient also was thought that his ventral hernia   The patient had a recent colonoscopy and a couple of benign polyps were removed. Dr. Christella Hartigan also noticed a soft nodule in the anal canal and the the patient was referred to me for excision of this at some point.   The patient gives a history of being told that he had an anal fissure in the past. He will have a little bit of bleeding once a month if he has a hard bowel movement, but he doesn't have much pain. He has never had any rectal surgery.   Past history is significant for the partial pancreatectomy for a neuroendocrine tumor on August 14, 2008. This was a 4.9 cm tumor and his last staging evaluation was negative except for a stable right liver nodule which is thought to be a hemangioma. He does have a small ventral hernia in the midline above the umbilicus. He says it is now hurts him when he does situps. His ejection having this repaired this year. HPI    Past Medical History   Diagnosis  Date   .  DIAB W/O COMP TYPE II/UNS NOT  STATED UNCNTRL  03/30/2009   .  SHOULDER PAIN, LEFT  10/20/2008   .  BURSITIS, LEFT HIP  05/20/2010   .  ELEVATED BLOOD PRESSURE  03/30/2009   .  Hyperlipidemia  10/11/2010   .  Arthritis     .  Cancer  2010       pancreatic cancer   .  Fissure, anal         occ bleeds still   .  Chronic kidney disease         stones       Past Surgical History   Procedure  Date   .  Lipoma excision     .  Biopsy pancreas  2010       benign   .  Kidney stones  2010 & 2008 & 2005   .  Back surgery  2000   .  Vasectomy  1994   .  Tonsillectomy  1967   .  Pancreatic tumor  2010   .  Right rotator cuff         2011   .  Left rotator cuff  02/26/2011   .  Left shoulder bone spurs  2011       Family History   Problem  Relation  Age of Onset   .  Diabetes  Brother     .  Stroke  Father     .  Heart disease  Father     .  Colon cancer  Maternal Grandfather     .  Cancer  Maternal Grandfather         colon      Social History History   Substance Use Topics   .  Smoking status:  Never Smoker    .  Smokeless tobacco:  Not on file   .  Alcohol Use:  Yes         VERY LITTLE       Allergies   Allergen  Reactions   .  Merthiolate (Thimerosol)         Unknown reaction   .  Morphine         REACTION: paronia       Current Outpatient Prescriptions   Medication  Sig  Dispense  Refill   .  aspirin 325 MG tablet  Take 325 mg by mouth daily.           Marland Kitchen  atorvastatin (LIPITOR) 20 MG tablet  20 mg daily.          .  CELEBREX 200 MG capsule  200 mg as needed.          .  cetirizine (ZYRTEC) 10 MG tablet  Take 10 mg by mouth daily.           Marland Kitchen  FIBER PO  Take by mouth.           Marland Kitchen  glucose blood (ONE TOUCH TEST STRIPS) test strip  Use as instructed   100 each   12   .  Lancets (ONETOUCH ULTRASOFT) lancets  Use as instructed   100 each   12   .  LORazepam (ATIVAN) 1 MG tablet  Take 1 mg by mouth every 8 (eight) hours. Only takes when flying          .  metFORMIN (GLUCOPHAGE) 500 MG tablet  Take 1  tablet (500 mg total) by mouth 2 (two) times daily with a meal.   60 tablet   11   .  Multiple Vitamins-Minerals (CENTRUM PO)  Take by mouth daily.          Marland Kitchen  olopatadine (PATANOL) 0.1 % ophthalmic solution  Place 1 drop into both eyes 2 (two) times daily.   5 mL   11   .  potassium citrate (UROCIT-K) 10 MEQ (1080 MG) SR tablet  Take 1 tablet (10 mEq total) by mouth QID.   120 tablet   2   .  VYTORIN 10-20 MG per tablet              Review of Systems  Constitutional: Negative for fever, chills and unexpected weight change.  HENT: Negative for hearing loss, congestion, sore throat, trouble swallowing and voice change.   Eyes: Negative for visual disturbance.  Respiratory: Negative for cough and wheezing.   Cardiovascular:  Negative for chest pain, palpitations and leg swelling.  Gastrointestinal: Positive for abdominal pain and anal bleeding. Negative for nausea, vomiting, diarrhea, constipation, blood in stool, abdominal distention and rectal pain.  Genitourinary: Negative for hematuria and difficulty urinating.  Musculoskeletal: Negative for arthralgias.  Skin: Negative for rash and wound.  Neurological: Negative for seizures, syncope, weakness and headaches.  Hematological: Negative for adenopathy. Does not bruise/bleed easily.  Psychiatric/Behavioral: Negative for confusion.    Blood pressure 148/88, pulse 72, temperature 97.4 F (36.3 C), temperature source Temporal, resp. rate 16, height 6\' 3"  (1.905 m), weight 284 lb (128.822 kg).  Physical Exam  Constitutional: He is oriented to person, place, and time. He appears well-developed and well-nourished. No distress.       obese  HENT:   Head: Normocephalic.   Nose: Nose normal.   Mouth/Throat: No oropharyngeal exudate.  Eyes: Conjunctivae and EOM are normal. Pupils are equal, round, and reactive to light. Right eye exhibits no discharge. Left eye exhibits no discharge. No scleral icterus.  Neck: Normal range of motion. Neck  supple. No JVD present. No tracheal deviation present. No thyromegaly present.  Cardiovascular: Normal rate, regular rhythm, normal heart sounds and intact distal pulses.    No murmur heard. Pulmonary/Chest: Effort normal and breath sounds normal. No stridor. No respiratory distress. He has no wheezes. He has no rales. He exhibits no tenderness.  Abdominal: Soft. Bowel sounds are normal. He exhibits no distension and no mass. There is no tenderness. There is no rebound and no guarding.    Genitourinary:     Musculoskeletal: Normal range of motion. He exhibits no edema and no tenderness.  Lymphadenopathy:    He has no cervical adenopathy.  Neurological: He is alert and oriented to person, place, and time. He has normal reflexes. Coordination normal.  Skin: Skin is warm and dry. No rash noted. He is not diaphoretic. No erythema. No pallor.  Psychiatric: He has a normal mood and affect. His behavior is normal. Judgment and thought content normal.    Data Reviewed I reviewed all of my old office records. I reviewed Dr. Wendall Papa colonoscopy report. I reviewed his CT scan from Dec 03, 2010.   Assessment Ventral incisional hernia, possibly partially incarcerated, midline, above umbilicus. Symptomatic. Patient  desires repair   Anal canal mass, posterior midline. 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   Well-differentiated neuroendocrine tumor of the pancreas, probably benign, no evidence of recurrence almost 3 years postop partial pancreatectomy.   Obesity   History of kidney stones   Hemangioma of right lobe of liver, stable   Plan I told the patient that we should not do rectal surgery simultaneous with the ventral hernia repair because of the implantation of synthetic mesh, and he understands that. He desires that we proceed with a ventral hernia first, and that we delay the anal fissure and anal canal  mass excision until January.   I have discussed the indications and details of surgical repair of his ventral incisional hernia with him. Risks and indications have been outlined, including but limited to bleeding, infection, conversion to open laparotomy, recurrence of the hernia, injury to adjacent organs such as the intestine with major reconstructive surgery, cardiac pulmonary and thromboembolic problems. He seems to understand these issues well. Aall of his questions were answered. He is in agreement with this plan.   Orders are written to schedule him for elective laparoscopic repair of ventral  incisional hernia with mesh, possible open.       Sagan Wurzel M 06/10/2011, 10:34 AM                Not recorded      Pending        Disp Refills Start End    heparin injection 5,000 Units     06/10/2011 06/10/2011    Route:  Subcutaneous    Class:  Normal    Comment:  Heparin 5000 units evefry 8 hours, starting 23 hours post op.    ceFAZolin (ANCEF) 2 g in dextrose 5 % 50 mL IVPB     06/10/2011      Route:  Intravenous    Class:  Normal            Patient Instructions     Your ventral incisional hernia looks about the same. I think some fatty tissue may be trapped in there but not much. We are going to schedule you for elective repair of this hernia with mesh. Hopefully we will be able to do this laparoscopically. You also have a mass in your anal canal which may be a chronic inflammatory polyp related to your anal fissure. We discussed different logistics to get both surgical problems taking care. It was your decision to have a ventral hernia repaired this year, and to proceed with excision of the anal canal polyp and repaired her anal fissure early next year.   Hernia Repair with Laparoscope A hernia occurs when an internal organ pushes out through a weak spot in the belly (abdominal) wall muscles. Hernias most commonly occur in the groin and around the navel. Hernias  can also occur through a cut by the surgeon (incision) after an abdominal operation. A hernia may be caused by: Lifting heavy objects.   Prolonged coughing.   Straining to move your bowels.  Hernias can often be pushed back into place (reduced). Most hernias tend to get worse over time. Problems occur when abdominal contents get stuck in the opening and the blood supply is blocked or impaired (incarcerated hernia). Because of these risks, you require surgery to repair the hernia. Your hernia will be repaired using a laparoscope. Laparoscopic surgery is a type of minimally invasive surgery. It does not involve making a typical surgical cut (incision) in the skin. A laparoscope is a telescope-like rod and lens system. It is usually connected to a video camera and a light source so your caregiver can clearly see the operative area. The instruments are inserted through  to  inch (5 mm or 10 mm) openings in the skin at specific locations. A working and viewing space is created by blowing a small amount of carbon dioxide gas into the abdominal cavity. The abdomen is essentially blown up like a balloon (insufflated). This elevates the abdominal wall above the internal organs like a dome. The carbon dioxide gas is common to the human body and can be absorbed by tissue and removed by the respiratory system. Once the repair is completed, the small incisions will be closed with either stitches (sutures) or staples (just like a paper stapler only this staple holds the skin together). LET YOUR CAREGIVERS KNOW ABOUT: Allergies.   Medications taken including herbs, eye drops, over the counter medications, and creams.   Use of steroids (by mouth or creams).   Previous problems with anesthetics or Novocaine.   Possibility of pregnancy, if this applies.   History of blood clots (thrombophlebitis).   History of bleeding  or blood problems.   Previous surgery.   Other health problems.  BEFORE THE PROCEDURE     Laparoscopy can be done either in a hospital or out-patient clinic. You may be given a mild sedative to help you relax before the procedure. Once in the operating room, you will be given a general anesthesia to make you sleep (unless you and your caregiver choose a different anesthetic).   AFTER THE PROCEDURE   After the procedure you will be watched in a recovery area. Depending on what type of hernia was repaired, you might be admitted to the hospital or you might go home the same day. With this procedure you may have less pain and scarring. This usually results in a quicker recovery and less risk of infection. HOME CARE INSTRUCTIONS   Bed rest is not required. You may continue your normal activities but avoid heavy lifting (more than 10 pounds) or straining.   Cough gently. If you are a smoker it is best to stop, as even the best hernia repair can break down with the continual strain of coughing.   Avoid driving until given the OK by your surgeon.   There are no dietary restrictions unless given otherwise.   TAKE ALL MEDICATIONS AS DIRECTED.   Only take over-the-counter or prescription medicines for pain, discomfort, or fever as directed by your caregiver.  SEEK MEDICAL CARE IF:   There is increasing abdominal pain or pain in your incisions.   There is more bleeding from incisions, other than minimal spotting.   You feel light headed or faint.   You develop an unexplained fever, chills, and/or an oral temperature above 102 F (38.9 C).   You have redness, swelling, or increasing pain in the wound.   Pus coming from wound.   A foul smell coming from the wound or dressings.  SEEK IMMEDIATE MEDICAL CARE IF:   You develop a rash.   You have difficulty breathing.   You have any allergic problems.  MAKE SURE YOU:   Understand these instructions.   Will watch your condition.   Will get help right away if you are not doing well or get worse.  Document Released: 07/21/2005 Document Revised:  04/02/2011 Document Reviewed: 06/20/2009 [facility] Patient Information 2012 [place], [place].          Level of Service     PR OFFICE/OUTPT VISIT,EST,LEVL III [id number]      Follow-up and Disposition     Return for schedule surgery.        All Flowsheet Templates (all recorded)     Encounter Vitals Flowsheet    Custom Formula Data Flowsheet    Anthropometrics Flowsheet               Referring Provider          Elberta Fortis Burchette       All Charges for This Encounter       Code Description Service Date Service Provider Modifiers Quantity    99213 PR OFFICE/OUTPT VISIT,EST,LEVL III 06/10/2011 Ernestene Mention, MD   1        Other Encounter Related Information     Allergies & Medications         Problem List         History         Patient-Entered Questionnaires     No data filed

## 2011-07-08 ENCOUNTER — Encounter (HOSPITAL_COMMUNITY): Payer: Self-pay | Admitting: *Deleted

## 2011-07-08 ENCOUNTER — Inpatient Hospital Stay (HOSPITAL_COMMUNITY)
Admission: RE | Admit: 2011-07-08 | Discharge: 2011-07-09 | DRG: 151 | Disposition: A | Discharge status: Home / Self Care | Payer: BC Managed Care – PPO | Source: Ambulatory Visit | Attending: General Surgery | Admitting: General Surgery

## 2011-07-08 ENCOUNTER — Other Ambulatory Visit (INDEPENDENT_AMBULATORY_CARE_PROVIDER_SITE_OTHER): Payer: Self-pay | Admitting: General Surgery

## 2011-07-08 ENCOUNTER — Ambulatory Visit (HOSPITAL_COMMUNITY): Payer: BC Managed Care – PPO | Admitting: Certified Registered"

## 2011-07-08 ENCOUNTER — Encounter (HOSPITAL_COMMUNITY)
Admission: RE | Disposition: A | Discharge status: Home / Self Care | Payer: Self-pay | Source: Ambulatory Visit | Attending: General Surgery

## 2011-07-08 ENCOUNTER — Encounter (HOSPITAL_COMMUNITY): Payer: Self-pay | Admitting: Certified Registered"

## 2011-07-08 DIAGNOSIS — E119 Type 2 diabetes mellitus without complications: Secondary | ICD-10-CM

## 2011-07-08 DIAGNOSIS — K439 Ventral hernia without obstruction or gangrene: Secondary | ICD-10-CM

## 2011-07-08 DIAGNOSIS — Z9889 Other specified postprocedural states: Secondary | ICD-10-CM

## 2011-07-08 DIAGNOSIS — K436 Other and unspecified ventral hernia with obstruction, without gangrene: Principal | ICD-10-CM | POA: Diagnosis present

## 2011-07-08 DIAGNOSIS — E785 Hyperlipidemia, unspecified: Secondary | ICD-10-CM | POA: Diagnosis present

## 2011-07-08 HISTORY — DX: Claustrophobia: F40.240

## 2011-07-08 HISTORY — PX: LAPAROSCOPIC INCISIONAL / UMBILICAL / VENTRAL HERNIA REPAIR: SUR789

## 2011-07-08 HISTORY — DX: Headache: R51

## 2011-07-08 HISTORY — PX: VENTRAL HERNIA REPAIR: SHX424

## 2011-07-08 LAB — GLUCOSE, CAPILLARY
Glucose-Capillary: 126 mg/dL — ABNORMAL HIGH (ref 70–99)
Glucose-Capillary: 120 mg/dL — ABNORMAL HIGH (ref 70–99)

## 2011-07-08 SURGERY — REPAIR, HERNIA, VENTRAL, LAPAROSCOPIC
Anesthesia: General | Wound class: Clean

## 2011-07-08 MED ORDER — CELECOXIB 200 MG PO CAPS
200.0000 mg | ORAL_CAPSULE | Freq: Two times a day (BID) | ORAL | Status: DC
  Administered 2011-07-08 – 2011-07-09 (×3): 200 mg via ORAL
  Filled 2011-07-08 (×4): qty 1

## 2011-07-08 MED ORDER — HYDROMORPHONE HCL PF 1 MG/ML IJ SOLN
INTRAMUSCULAR | Status: AC
  Filled 2011-07-08: qty 1

## 2011-07-08 MED ORDER — SIMVASTATIN 20 MG PO TABS
20.0000 mg | ORAL_TABLET | Freq: Every day | ORAL | Status: DC
  Filled 2011-07-08 (×2): qty 1

## 2011-07-08 MED ORDER — LORATADINE 10 MG PO TABS
10.0000 mg | ORAL_TABLET | Freq: Every day | ORAL | Status: DC
  Administered 2011-07-09: 10 mg via ORAL
  Filled 2011-07-08 (×2): qty 1

## 2011-07-08 MED ORDER — ONDANSETRON HCL 4 MG/2ML IJ SOLN: 4.0000 mg | Freq: Four times a day (QID) | INTRAMUSCULAR | Status: DC | PRN

## 2011-07-08 MED ORDER — OLOPATADINE HCL 0.1 % OP SOLN
1.0000 [drp] | Freq: Two times a day (BID) | OPHTHALMIC | Status: DC
  Administered 2011-07-08: 1 [drp] via OPHTHALMIC
  Filled 2011-07-08: qty 5

## 2011-07-08 MED ORDER — MORPHINE SULFATE 2 MG/ML IJ SOLN: 4.0000 mg | INTRAMUSCULAR | Status: DC | PRN

## 2011-07-08 MED ORDER — PNEUMOCOCCAL VAC POLYVALENT 25 MCG/0.5ML IJ INJ
0.5000 mL | INJECTION | INTRAMUSCULAR | Status: AC
  Administered 2011-07-09: 0.5 mL via INTRAMUSCULAR
  Filled 2011-07-08: qty 0.5

## 2011-07-08 MED ORDER — FIBER PO TABS: ORAL_TABLET | ORAL | Status: DC

## 2011-07-08 MED ORDER — METFORMIN HCL 500 MG PO TABS
500.0000 mg | ORAL_TABLET | Freq: Every day | ORAL | Status: DC
  Administered 2011-07-09: 500 mg via ORAL
  Filled 2011-07-08 (×3): qty 1

## 2011-07-08 MED ORDER — KETOROLAC TROMETHAMINE 30 MG/ML IJ SOLN: 15.0000 mg | Freq: Once | INTRAMUSCULAR | Status: DC | PRN

## 2011-07-08 MED ORDER — CALCIUM POLYCARBOPHIL 625 MG PO TABS
625.0000 mg | ORAL_TABLET | Freq: Every day | ORAL | Status: DC
  Administered 2011-07-08 – 2011-07-09 (×2): 625 mg via ORAL
  Filled 2011-07-08 (×2): qty 1

## 2011-07-08 MED ORDER — CEFAZOLIN SODIUM 1-5 GM-% IV SOLN
INTRAVENOUS | Status: DC | PRN
  Administered 2011-07-08: 2 g via INTRAVENOUS

## 2011-07-08 MED ORDER — HYDROMORPHONE HCL PF 1 MG/ML IJ SOLN
0.2500 mg | INTRAMUSCULAR | Status: DC | PRN
  Administered 2011-07-08 (×2): 0.5 mg via INTRAVENOUS

## 2011-07-08 MED ORDER — ROCURONIUM BROMIDE 100 MG/10ML IV SOLN
INTRAVENOUS | Status: DC | PRN
Start: 2011-07-08 — End: 2011-07-08
  Administered 2011-07-08 (×3): 10 mg via INTRAVENOUS
  Administered 2011-07-08: 50 mg via INTRAVENOUS
  Administered 2011-07-08: 20 mg via INTRAVENOUS

## 2011-07-08 MED ORDER — PROMETHAZINE HCL 25 MG/ML IJ SOLN: 6.2500 mg | INTRAMUSCULAR | Status: DC | PRN

## 2011-07-08 MED ORDER — BUPIVACAINE-EPINEPHRINE 0.25% -1:200000 IJ SOLN
INTRAMUSCULAR | Status: DC | PRN
  Administered 2011-07-08: 9 mL

## 2011-07-08 MED ORDER — ONETOUCH ULTRASOFT LANCETS MISC: 1.0000 | Freq: Three times a day (TID) | Status: DC

## 2011-07-08 MED ORDER — HYDROMORPHONE HCL PF 1 MG/ML IJ SOLN
INTRAMUSCULAR | Status: AC
  Administered 2011-07-08: 0.5 mg via INTRAVENOUS
  Filled 2011-07-08: qty 1

## 2011-07-08 MED ORDER — CELECOXIB 200 MG PO CAPS
200.0000 mg | ORAL_CAPSULE | Freq: Every day | ORAL | Status: DC
  Filled 2011-07-08: qty 1

## 2011-07-08 MED ORDER — HYDROMORPHONE HCL PF 1 MG/ML IJ SOLN
0.5000 mg | INTRAMUSCULAR | Status: DC | PRN
  Administered 2011-07-08 (×4): 0.5 mg via INTRAVENOUS
  Filled 2011-07-08 (×3): qty 1

## 2011-07-08 MED ORDER — SODIUM CHLORIDE 0.9 % IR SOLN
Status: DC | PRN
  Administered 2011-07-08: 1000 mL

## 2011-07-08 MED ORDER — LACTATED RINGERS IV SOLN: INTRAVENOUS | Status: DC

## 2011-07-08 MED ORDER — DROPERIDOL 2.5 MG/ML IJ SOLN
INTRAMUSCULAR | Status: DC | PRN
  Administered 2011-07-08: .625 mg via INTRAVENOUS

## 2011-07-08 MED ORDER — THERA M PLUS PO TABS
1.0000 | ORAL_TABLET | Freq: Every day | ORAL | Status: DC
  Administered 2011-07-08 – 2011-07-09 (×2): 1 via ORAL
  Filled 2011-07-08 (×2): qty 1

## 2011-07-08 MED ORDER — HEPARIN SODIUM (PORCINE) 5000 UNIT/ML IJ SOLN
5000.0000 [IU] | INTRAMUSCULAR | Status: AC
  Administered 2011-07-08: 5000 [IU] via SUBCUTANEOUS
  Filled 2011-07-08: qty 1

## 2011-07-08 MED ORDER — ONDANSETRON HCL 4 MG/2ML IJ SOLN
INTRAMUSCULAR | Status: DC | PRN
  Administered 2011-07-08 (×2): 4 mg via INTRAVENOUS

## 2011-07-08 MED ORDER — GLYCOPYRROLATE 0.2 MG/ML IJ SOLN
INTRAMUSCULAR | Status: DC | PRN
  Administered 2011-07-08: .8 mg via INTRAVENOUS

## 2011-07-08 MED ORDER — OXYCODONE-ACETAMINOPHEN 5-325 MG PO TABS
1.0000 | ORAL_TABLET | ORAL | Status: DC | PRN
  Administered 2011-07-08 (×3): 2 via ORAL
  Filled 2011-07-08 (×3): qty 2

## 2011-07-08 MED ORDER — NEOSTIGMINE METHYLSULFATE 1 MG/ML IJ SOLN
INTRAMUSCULAR | Status: DC | PRN
  Administered 2011-07-08: 5 mg via INTRAVENOUS

## 2011-07-08 MED ORDER — LACTATED RINGERS IV SOLN
INTRAVENOUS | Status: DC | PRN
  Administered 2011-07-08 (×3): via INTRAVENOUS

## 2011-07-08 MED ORDER — POTASSIUM CITRATE ER 10 MEQ (1080 MG) PO TBCR
10.0000 meq | EXTENDED_RELEASE_TABLET | Freq: Two times a day (BID) | ORAL | Status: DC
  Administered 2011-07-08 – 2011-07-09 (×3): 10 meq via ORAL
  Filled 2011-07-08 (×4): qty 1

## 2011-07-08 MED ORDER — ASPIRIN 325 MG PO TABS
325.0000 mg | ORAL_TABLET | Freq: Every day | ORAL | Status: DC
  Administered 2011-07-09: 325 mg via ORAL
  Filled 2011-07-08 (×2): qty 1

## 2011-07-08 MED ORDER — MIDAZOLAM HCL 5 MG/5ML IJ SOLN
INTRAMUSCULAR | Status: DC | PRN
  Administered 2011-07-08: 2 mg via INTRAVENOUS

## 2011-07-08 MED ORDER — MORPHINE SULFATE 4 MG/ML IJ SOLN
INTRAMUSCULAR | Status: AC
  Filled 2011-07-08: qty 1

## 2011-07-08 MED ORDER — GLUCOSE BLOOD VI STRP: 1.0000 | ORAL_STRIP | Freq: Three times a day (TID) | Status: DC

## 2011-07-08 MED ORDER — CENTRUM PO LIQD: 5.0000 mL | Freq: Every day | ORAL | Status: DC

## 2011-07-08 MED ORDER — MEPERIDINE HCL 25 MG/ML IJ SOLN: 6.2500 mg | INTRAMUSCULAR | Status: DC | PRN

## 2011-07-08 MED ORDER — IBUPROFEN 200 MG PO TABS: 200.0000 mg | ORAL_TABLET | Freq: Four times a day (QID) | ORAL | Status: DC | PRN

## 2011-07-08 MED ORDER — SUFENTANIL CITRATE 50 MCG/ML IV SOLN
INTRAVENOUS | Status: DC | PRN
  Administered 2011-07-08: 15 ug via INTRAVENOUS
  Administered 2011-07-08: 10 ug via INTRAVENOUS
  Administered 2011-07-08: 20 ug via INTRAVENOUS
  Administered 2011-07-08: 5 ug via INTRAVENOUS

## 2011-07-08 MED ORDER — CEFAZOLIN SODIUM-DEXTROSE 2-3 GM-% IV SOLR
2.0000 g | Freq: Three times a day (TID) | INTRAVENOUS | Status: AC
  Administered 2011-07-08 – 2011-07-09 (×3): 2 g via INTRAVENOUS
  Filled 2011-07-08 (×3): qty 50

## 2011-07-08 MED ORDER — ONDANSETRON HCL 4 MG PO TABS: 4.0000 mg | ORAL_TABLET | Freq: Four times a day (QID) | ORAL | Status: DC | PRN

## 2011-07-08 MED ORDER — HEPARIN SODIUM (PORCINE) 5000 UNIT/ML IJ SOLN
5000.0000 [IU] | Freq: Three times a day (TID) | INTRAMUSCULAR | Status: DC
  Administered 2011-07-09 (×2): 5000 [IU] via SUBCUTANEOUS
  Filled 2011-07-08 (×4): qty 1

## 2011-07-08 MED ORDER — PROPOFOL 10 MG/ML IV EMUL
INTRAVENOUS | Status: DC | PRN
  Administered 2011-07-08: 200 mg via INTRAVENOUS

## 2011-07-08 SURGICAL SUPPLY — 47 items
APPLICATOR COTTON TIP 6IN STRL (MISCELLANEOUS) ×2 IMPLANT
APPLIER CLIP LOGIC TI 5 (MISCELLANEOUS) IMPLANT
BENZOIN TINCTURE PRP APPL 2/3 (GAUZE/BANDAGES/DRESSINGS) ×2 IMPLANT
BLADE SURG ROTATE 9660 (MISCELLANEOUS) ×2 IMPLANT
CANISTER SUCTION 2500CC (MISCELLANEOUS) ×2 IMPLANT
CHLORAPREP W/TINT 26ML (MISCELLANEOUS) ×2 IMPLANT
CLOTH BEACON ORANGE TIMEOUT ST (SAFETY) ×2 IMPLANT
COVER SURGICAL LIGHT HANDLE (MISCELLANEOUS) ×2 IMPLANT
DECANTER SPIKE VIAL GLASS SM (MISCELLANEOUS) ×2 IMPLANT
DEVICE SECURE STRAP 25 ABSORB (INSTRUMENTS) ×6 IMPLANT
DEVICE TROCAR PUNCTURE CLOSURE (ENDOMECHANICALS) ×2 IMPLANT
DRSG TEGADERM 2-3/8X2-3/4 SM (GAUZE/BANDAGES/DRESSINGS) ×22 IMPLANT
ELECT REM PT RETURN 9FT ADLT (ELECTROSURGICAL) ×2
ELECTRODE REM PT RTRN 9FT ADLT (ELECTROSURGICAL) ×1 IMPLANT
GAUZE SPONGE 2X2 8PLY STRL LF (GAUZE/BANDAGES/DRESSINGS) ×2 IMPLANT
GLOVE BIO SURGEON STRL SZ7.5 (GLOVE) ×6 IMPLANT
GLOVE BIOGEL PI IND STRL 7.5 (GLOVE) ×1 IMPLANT
GLOVE BIOGEL PI INDICATOR 7.5 (GLOVE) ×1
GLOVE EUDERMIC 7 POWDERFREE (GLOVE) ×2 IMPLANT
GOWN PREVENTION PLUS XLARGE (GOWN DISPOSABLE) ×2 IMPLANT
GOWN STRL NON-REIN LRG LVL3 (GOWN DISPOSABLE) ×6 IMPLANT
IV NS 1000ML (IV SOLUTION) ×1
IV NS 1000ML BAXH (IV SOLUTION) ×1 IMPLANT
KIT BASIN OR (CUSTOM PROCEDURE TRAY) ×2 IMPLANT
KIT ROOM TURNOVER OR (KITS) ×2 IMPLANT
MESH PHYSIO OVAL 20X25CM (Mesh General) ×2 IMPLANT
NEEDLE SPNL 22GX3.5 QUINCKE BK (NEEDLE) ×2 IMPLANT
NS IRRIG 1000ML POUR BTL (IV SOLUTION) ×2 IMPLANT
PAD ARMBOARD 7.5X6 YLW CONV (MISCELLANEOUS) ×4 IMPLANT
PEN SKIN MARKING BROAD (MISCELLANEOUS) ×4 IMPLANT
SCALPEL HARMONIC ACE (MISCELLANEOUS) ×2 IMPLANT
SCISSORS LAP 5X35 DISP (ENDOMECHANICALS) ×2 IMPLANT
SET IRRIG TUBING LAPAROSCOPIC (IRRIGATION / IRRIGATOR) ×2 IMPLANT
SLEEVE ENDOPATH XCEL 5M (ENDOMECHANICALS) ×10 IMPLANT
SPONGE GAUZE 2X2 STER 10/PKG (GAUZE/BANDAGES/DRESSINGS) ×2
STAPLER VISISTAT 35W (STAPLE) ×2 IMPLANT
SUT MON AB 4-0 PC3 18 (SUTURE) ×2 IMPLANT
SUT NOVA NAB GS-21 0 18 T12 DT (SUTURE) ×4 IMPLANT
SUT NOVAFIL 0 T 12 (SUTURE) ×4 IMPLANT
SUT VICRYL 0 TIES 12 18 (SUTURE) IMPLANT
TOWEL OR 17X24 6PK STRL BLUE (TOWEL DISPOSABLE) ×2 IMPLANT
TOWEL OR 17X26 10 PK STRL BLUE (TOWEL DISPOSABLE) ×2 IMPLANT
TRAY FOLEY CATH 14FRSI W/METER (CATHETERS) ×2 IMPLANT
TRAY LAPAROSCOPIC (CUSTOM PROCEDURE TRAY) ×2 IMPLANT
TROCAR XCEL NON-BLD 11X100MML (ENDOMECHANICALS) ×2 IMPLANT
TROCAR XCEL NON-BLD 5MMX100MML (ENDOMECHANICALS) ×2 IMPLANT
WATER STERILE IRR 1000ML POUR (IV SOLUTION) IMPLANT

## 2011-07-08 NOTE — Progress Notes (Signed)
All checklist information including NPO status confirmed with Pt in holding area. 

## 2011-07-08 NOTE — Transfer of Care (Signed)
Immediate Anesthesia Transfer of Care Note  Patient: [name]  Procedure(s) Performed:  LAPAROSCOPIC VENTRAL HERNIA - Laparoscopic repair ventral hernia with mesh, possibly open  Patient Location: PACU  Anesthesia Type: General  Level of Consciousness: awake, oriented and patient cooperative  Airway & Oxygen Therapy: Patient Spontanous Breathing and Patient connected to face mask oxygen  Post-op Assessment: Report given to PACU RN  Post vital signs: Reviewed and stable  Complications: No apparent anesthesia complications

## 2011-07-08 NOTE — H&P (Signed)
  Ahmod P Templin   06/10/2011 10:00 AM Office Visit  MRN: [id number]   Description: 50 year old male  Provider: Rekita Miotke M, MD  Department: [facility]        Diagnoses     Incisional ventral hernia w obstruction   - Primary    552.21    Anal fissure     565.0      Reason for Visit     Routine Post Op    Have lesion removed. Referred by Dr. Jacobs.        Vitals - Last Recorded       BP Pulse Temp(Src) Resp Ht Wt    148/88  72  97.4 F (36.3 C) (Temporal)  16  6' 3" (1.905 m)  284 lb (128.822 kg)          BMI              35.50 kg/m2                 Progress Notes     Delphine Sizemore M, MD  06/10/2011 10:45 AM  SignedChief Complaint   Patient presents with   .  Routine Post Op       Have lesion removed. Referred by Dr. Jacobs.      HPI Shalev P Ditmars is a 50 y.o. male.     The patient is referred back to me by Dr. Daniel Jacobs because of an anal canal mass. The patient also was thought that his ventral hernia   The patient had a recent colonoscopy and a couple of benign polyps were removed. Dr. Jacobs also noticed a soft nodule in the anal canal and the the patient was referred to me for excision of this at some point.   The patient gives a history of being told that he had an anal fissure in the past. He will have a little bit of bleeding once a month if he has a hard bowel movement, but he doesn't have much pain. He has never had any rectal surgery.   Past history is significant for the partial pancreatectomy for a neuroendocrine tumor on August 14, 2008. This was a 4.9 cm tumor and his last staging evaluation was negative except for a stable right liver nodule which is thought to be a hemangioma. He does have a small ventral hernia in the midline above the umbilicus. He says it is now hurts him when he does situps. His ejection having this repaired this year. HPI    Past Medical History   Diagnosis  Date   .  DIAB W/O COMP TYPE II/UNS NOT  STATED UNCNTRL  03/30/2009   .  SHOULDER PAIN, LEFT  10/20/2008   .  BURSITIS, LEFT HIP  05/20/2010   .  ELEVATED BLOOD PRESSURE  03/30/2009   .  Hyperlipidemia  10/11/2010   .  Arthritis     .  Cancer  2010       pancreatic cancer   .  Fissure, anal         occ bleeds still   .  Chronic kidney disease         stones       Past Surgical History   Procedure  Date   .  Lipoma excision     .  Biopsy pancreas  2010       benign   .  Kidney stones  2010 & 2008 & 2005   .    Back surgery  2000   .  Vasectomy  1994   .  Tonsillectomy  1967   .  Pancreatic tumor  2010   .  Right rotator cuff         2011   .  Left rotator cuff  02/26/2011   .  Left shoulder bone spurs  2011       Family History   Problem  Relation  Age of Onset   .  Diabetes  Brother     .  Stroke  Father     .  Heart disease  Father     .  Colon cancer  Maternal Grandfather     .  Cancer  Maternal Grandfather         colon      Social History History   Substance Use Topics   .  Smoking status:  Never Smoker    .  Smokeless tobacco:  Not on file   .  Alcohol Use:  Yes         VERY LITTLE       Allergies   Allergen  Reactions   .  Merthiolate (Thimerosol)         Unknown reaction   .  Morphine         REACTION: paronia       Current Outpatient Prescriptions   Medication  Sig  Dispense  Refill   .  aspirin 325 MG tablet  Take 325 mg by mouth daily.           .  atorvastatin (LIPITOR) 20 MG tablet  20 mg daily.          .  CELEBREX 200 MG capsule  200 mg as needed.          .  cetirizine (ZYRTEC) 10 MG tablet  Take 10 mg by mouth daily.           .  FIBER PO  Take by mouth.           .  glucose blood (ONE TOUCH TEST STRIPS) test strip  Use as instructed   100 each   12   .  Lancets (ONETOUCH ULTRASOFT) lancets  Use as instructed   100 each   12   .  LORazepam (ATIVAN) 1 MG tablet  Take 1 mg by mouth every 8 (eight) hours. Only takes when flying          .  metFORMIN (GLUCOPHAGE) 500 MG tablet  Take 1  tablet (500 mg total) by mouth 2 (two) times daily with a meal.   60 tablet   11   .  Multiple Vitamins-Minerals (CENTRUM PO)  Take by mouth daily.          .  olopatadine (PATANOL) 0.1 % ophthalmic solution  Place 1 drop into both eyes 2 (two) times daily.   5 mL   11   .  potassium citrate (UROCIT-K) 10 MEQ (1080 MG) SR tablet  Take 1 tablet (10 mEq total) by mouth QID.   120 tablet   2   .  VYTORIN 10-20 MG per tablet              Review of Systems Review of Systems  Constitutional: Negative for fever, chills and unexpected weight change.  HENT: Negative for hearing loss, congestion, sore throat, trouble swallowing and voice change.   Eyes: Negative for visual disturbance.  Respiratory: Negative for cough and wheezing.     Cardiovascular: Negative for chest pain, palpitations and leg swelling.  Gastrointestinal: Positive for abdominal pain and anal bleeding. Negative for nausea, vomiting, diarrhea, constipation, blood in stool, abdominal distention and rectal pain.  Genitourinary: Negative for hematuria and difficulty urinating.  Musculoskeletal: Negative for arthralgias.  Skin: Negative for rash and wound.  Neurological: Negative for seizures, syncope, weakness and headaches.  Hematological: Negative for adenopathy. Does not bruise/bleed easily.  Psychiatric/Behavioral: Negative for confusion.    Blood pressure 148/88, pulse 72, temperature 97.4 F (36.3 C), temperature source Temporal, resp. rate 16, height 6' 3" (1.905 m), weight 284 lb (128.822 kg).   Physical Exam Physical Exam  Constitutional: He is oriented to person, place, and time. He appears well-developed and well-nourished. No distress.       obese  HENT:   Head: Normocephalic.   Nose: Nose normal.   Mouth/Throat: No oropharyngeal exudate.  Eyes: Conjunctivae and EOM are normal. Pupils are equal, round, and reactive to light. Right eye exhibits no discharge. Left eye exhibits no discharge. No scleral icterus.  Neck:  Normal range of motion. Neck supple. No JVD present. No tracheal deviation present. No thyromegaly present.  Cardiovascular: Normal rate, regular rhythm, normal heart sounds and intact distal pulses.    No murmur heard. Pulmonary/Chest: Effort normal and breath sounds normal. No stridor. No respiratory distress. He has no wheezes. He has no rales. He exhibits no tenderness.  Abdominal: Soft. Bowel sounds are normal. He exhibits no distension and no mass. There is no tenderness. There is no rebound and no guarding.    Genitourinary:     Musculoskeletal: Normal range of motion. He exhibits no edema and no tenderness.  Lymphadenopathy:    He has no cervical adenopathy.  Neurological: He is alert and oriented to person, place, and time. He has normal reflexes. Coordination normal.  Skin: Skin is warm and dry. No rash noted. He is not diaphoretic. No erythema. No pallor.  Psychiatric: He has a normal mood and affect. His behavior is normal. Judgment and thought content normal.    Data Reviewed I reviewed all of my old office records. I reviewed Dr. Dan Jacobs colonoscopy report. I reviewed his CT scan from Dec 03, 2010.   Assessment Ventral incisional hernia, possibly partially incarcerated, midline, above umbilicus. Symptomatic. Patient  desires repair   Anal canal mass, posterior midline. 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   Well-differentiated neuroendocrine tumor of the pancreas, probably benign, no evidence of recurrence almost 3 years postop partial pancreatectomy.   Obesity   History of kidney stones   Hemangioma of right lobe of liver, stable   Plan I told the patient that we should not do rectal surgery simultaneous with the ventral hernia repair because of the implantation of synthetic mesh, and he understands that. He desires that we proceed with a ventral hernia first, and that we delay the  anal fissure and anal canal mass excision until January.   I have discussed the indications and details of surgical repair of his ventral incisional hernia with him. Risks and indications have been outlined, including but limited to bleeding, infection, conversion to open laparotomy, recurrence of the hernia, injury to adjacent organs such as the intestine with major reconstructive surgery, cardiac pulmonary and thromboembolic problems. He seems to understand these issues well. Aall of his questions were answered. He is in agreement with this plan.   Orders are written to schedule him for elective   laparoscopic repair of ventral incisional hernia with mesh, possible open.       Lillyonna Armstead M 06/10/2011, 10:34 AM                Not recorded             Patient Instructions     Your ventral incisional hernia looks about the same. I think some fatty tissue may be trapped in there but not much. We are going to schedule you for elective repair of this hernia with mesh. Hopefully we will be able to do this laparoscopically. You also have a mass in your anal canal which may be a chronic inflammatory polyp related to your anal fissure. We discussed different logistics to get both surgical problems taking care. It was your decision to have a ventral hernia repaired this year, and to proceed with excision of the anal canal polyp and repaired her anal fissure early next year.   Hernia Repair with Laparoscope A hernia occurs when an internal organ pushes out through a weak spot in the belly (abdominal) wall muscles. Hernias most commonly occur in the groin and around the navel. Hernias can also occur through a cut by the surgeon (incision) after an abdominal operation. A hernia may be caused by: Lifting heavy objects.   Prolonged coughing.   Straining to move your bowels.  Hernias can often be pushed back into place (reduced). Most hernias tend to get worse over time. Problems occur  when abdominal contents get stuck in the opening and the blood supply is blocked or impaired (incarcerated hernia). Because of these risks, you require surgery to repair the hernia. Your hernia will be repaired using a laparoscope. Laparoscopic surgery is a type of minimally invasive surgery. It does not involve making a typical surgical cut (incision) in the skin. A laparoscope is a telescope-like rod and lens system. It is usually connected to a video camera and a light source so your caregiver can clearly see the operative area. The instruments are inserted through  to  inch (5 mm or 10 mm) openings in the skin at specific locations. A working and viewing space is created by blowing a small amount of carbon dioxide gas into the abdominal cavity. The abdomen is essentially blown up like a balloon (insufflated). This elevates the abdominal wall above the internal organs like a dome. The carbon dioxide gas is common to the human body and can be absorbed by tissue and removed by the respiratory system. Once the repair is completed, the small incisions will be closed with either stitches (sutures) or staples (just like a paper stapler only this staple holds the skin together). LET YOUR CAREGIVERS KNOW ABOUT: Allergies.   Medications taken including herbs, eye drops, over the counter medications, and creams.   Use of steroids (by mouth or creams).   Previous problems with anesthetics or Novocaine.   Possibility of pregnancy, if this applies.   History of blood clots (thrombophlebitis).   History of bleeding or blood problems.   Previous surgery.   Other health problems.  BEFORE THE PROCEDURE   Laparoscopy can be done either in a hospital or out-patient clinic. You may be given a mild sedative to help you relax before the procedure. Once in the operating room, you will be given a general anesthesia to make you sleep (unless you and your caregiver choose a different anesthetic).   AFTER THE PROCEDURE     After the procedure you will be watched in a   recovery area. Depending on what type of hernia was repaired, you might be admitted to the hospital or you might go home the same day. With this procedure you may have less pain and scarring. This usually results in a quicker recovery and less risk of infection. HOME CARE INSTRUCTIONS   Bed rest is not required. You may continue your normal activities but avoid heavy lifting (more than 10 pounds) or straining.   Cough gently. If you are a smoker it is best to stop, as even the best hernia repair can break down with the continual strain of coughing.   Avoid driving until given the OK by your surgeon.   There are no dietary restrictions unless given otherwise.   TAKE ALL MEDICATIONS AS DIRECTED.   Only take over-the-counter or prescription medicines for pain, discomfort, or fever as directed by your caregiver.  SEEK MEDICAL CARE IF:   There is increasing abdominal pain or pain in your incisions.   There is more bleeding from incisions, other than minimal spotting.   You feel light headed or faint.   You develop an unexplained fever, chills, and/or an oral temperature above 102 F (38.9 C).   You have redness, swelling, or increasing pain in the wound.   Pus coming from wound.   A foul smell coming from the wound or dressings.  SEEK IMMEDIATE MEDICAL CARE IF:   You develop a rash.   You have difficulty breathing.   You have any allergic problems.  MAKE SURE YOU:   Understand these instructions.   Will watch your condition.   Will get help right away if you are not doing well or get worse.  Document Released: 07/21/2005 Document Revised: 04/02/2011 Document Reviewed: 06/20/2009 [employer] Patient Information 2012 [employer]          Level of Service     PR OFFICE/OUTPT VISIT,EST,LEVL III [99213]      Follow-up and Disposition     Return for schedule surgery.        All Flowsheet Templates (all recorded)     Encounter Vitals  Flowsheet    Custom Formula Data Flowsheet    Anthropometrics Flowsheet               Referring Provider          Bruce W Burchette       All Charges for This Encounter       Code Description Service Date Service Provider Modifiers Quantity    99213 PR OFFICE/OUTPT VISIT,EST,LEVL III 06/10/2011 Livi Mcgann M Machelle Raybon, MD   1        Other Encounter Related Information     Allergies & Medications         Problem List         History         Patient-Entered Questionnaires     No data filed         

## 2011-07-08 NOTE — H&P (View-Only) (Signed)
[name]   06/10/2011 10:00 AM Office Visit  MRN: [id number]   Description: 50 year old male  Provider: Ernestene Mention, MD  Department: [facility]        Diagnoses     Incisional ventral hernia w obstruction   - Primary    552.21    Anal fissure     565.0      Reason for Visit     Routine Post Op    Have lesion removed. Referred by Dr. Christella Hartigan.        Vitals - Last Recorded       BP Pulse Temp(Src) Resp Ht Wt    148/88  72  97.4 F (36.3 C) (Temporal)  16  6\' 3"  (1.905 m)  284 lb (128.822 kg)          BMI              35.50 kg/m2                 Progress Notes     Ernestene Mention, MD  06/10/2011 10:45 AM  SignedChief Complaint   Patient presents with   .  Routine Post Op       Have lesion removed. Referred by Dr. Christella Hartigan.      HPI [name] is a 50 y.o. male.     The patient is referred back to me by Dr. Rob Bunting because of an anal canal mass. The patient also was thought that his ventral hernia   The patient had a recent colonoscopy and a couple of benign polyps were removed. Dr. Christella Hartigan also noticed a soft nodule in the anal canal and the the patient was referred to me for excision of this at some point.   The patient gives a history of being told that he had an anal fissure in the past. He will have a little bit of bleeding once a month if he has a hard bowel movement, but he doesn't have much pain. He has never had any rectal surgery.   Past history is significant for the partial pancreatectomy for a neuroendocrine tumor on August 14, 2008. This was a 4.9 cm tumor and his last staging evaluation was negative except for a stable right liver nodule which is thought to be a hemangioma. He does have a small ventral hernia in the midline above the umbilicus. He says it is now hurts him when he does situps. His ejection having this repaired this year. HPI    Past Medical History   Diagnosis  Date   .  DIAB W/O COMP TYPE II/UNS NOT  STATED UNCNTRL  03/30/2009   .  SHOULDER PAIN, LEFT  10/20/2008   .  BURSITIS, LEFT HIP  05/20/2010   .  ELEVATED BLOOD PRESSURE  03/30/2009   .  Hyperlipidemia  10/11/2010   .  Arthritis     .  Cancer  2010       pancreatic cancer   .  Fissure, anal         occ bleeds still   .  Chronic kidney disease         stones       Past Surgical History   Procedure  Date   .  Lipoma excision     .  Biopsy pancreas  2010       benign   .  Kidney stones  2010 & 2008 & 2005   .  Back surgery  2000   .  Vasectomy  1994   .  Tonsillectomy  1967   .  Pancreatic tumor  2010   .  Right rotator cuff         2011   .  Left rotator cuff  02/26/2011   .  Left shoulder bone spurs  2011       Family History   Problem  Relation  Age of Onset   .  Diabetes  Brother     .  Stroke  Father     .  Heart disease  Father     .  Colon cancer  Maternal Grandfather     .  Cancer  Maternal Grandfather         colon      Social History History   Substance Use Topics   .  Smoking status:  Never Smoker    .  Smokeless tobacco:  Not on file   .  Alcohol Use:  Yes         VERY LITTLE       Allergies   Allergen  Reactions   .  Merthiolate (Thimerosol)         Unknown reaction   .  Morphine         REACTION: paronia       Current Outpatient Prescriptions   Medication  Sig  Dispense  Refill   .  aspirin 325 MG tablet  Take 325 mg by mouth daily.           Marland Kitchen  atorvastatin (LIPITOR) 20 MG tablet  20 mg daily.          .  CELEBREX 200 MG capsule  200 mg as needed.          .  cetirizine (ZYRTEC) 10 MG tablet  Take 10 mg by mouth daily.           Marland Kitchen  FIBER PO  Take by mouth.           Marland Kitchen  glucose blood (ONE TOUCH TEST STRIPS) test strip  Use as instructed   100 each   12   .  Lancets (ONETOUCH ULTRASOFT) lancets  Use as instructed   100 each   12   .  LORazepam (ATIVAN) 1 MG tablet  Take 1 mg by mouth every 8 (eight) hours. Only takes when flying          .  metFORMIN (GLUCOPHAGE) 500 MG tablet  Take 1  tablet (500 mg total) by mouth 2 (two) times daily with a meal.   60 tablet   11   .  Multiple Vitamins-Minerals (CENTRUM PO)  Take by mouth daily.          Marland Kitchen  olopatadine (PATANOL) 0.1 % ophthalmic solution  Place 1 drop into both eyes 2 (two) times daily.   5 mL   11   .  potassium citrate (UROCIT-K) 10 MEQ (1080 MG) SR tablet  Take 1 tablet (10 mEq total) by mouth QID.   120 tablet   2   .  VYTORIN 10-20 MG per tablet              Review of Systems Review of Systems  Constitutional: Negative for fever, chills and unexpected weight change.  HENT: Negative for hearing loss, congestion, sore throat, trouble swallowing and voice change.   Eyes: Negative for visual disturbance.  Respiratory: Negative for cough and wheezing.  Cardiovascular: Negative for chest pain, palpitations and leg swelling.  Gastrointestinal: Positive for abdominal pain and anal bleeding. Negative for nausea, vomiting, diarrhea, constipation, blood in stool, abdominal distention and rectal pain.  Genitourinary: Negative for hematuria and difficulty urinating.  Musculoskeletal: Negative for arthralgias.  Skin: Negative for rash and wound.  Neurological: Negative for seizures, syncope, weakness and headaches.  Hematological: Negative for adenopathy. Does not bruise/bleed easily.  Psychiatric/Behavioral: Negative for confusion.    Blood pressure 148/88, pulse 72, temperature 97.4 F (36.3 C), temperature source Temporal, resp. rate 16, height 6\' 3"  (1.905 m), weight 284 lb (128.822 kg).   Physical Exam Physical Exam  Constitutional: He is oriented to person, place, and time. He appears well-developed and well-nourished. No distress.       obese  HENT:   Head: Normocephalic.   Nose: Nose normal.   Mouth/Throat: No oropharyngeal exudate.  Eyes: Conjunctivae and EOM are normal. Pupils are equal, round, and reactive to light. Right eye exhibits no discharge. Left eye exhibits no discharge. No scleral icterus.  Neck:  Normal range of motion. Neck supple. No JVD present. No tracheal deviation present. No thyromegaly present.  Cardiovascular: Normal rate, regular rhythm, normal heart sounds and intact distal pulses.    No murmur heard. Pulmonary/Chest: Effort normal and breath sounds normal. No stridor. No respiratory distress. He has no wheezes. He has no rales. He exhibits no tenderness.  Abdominal: Soft. Bowel sounds are normal. He exhibits no distension and no mass. There is no tenderness. There is no rebound and no guarding.    Genitourinary:     Musculoskeletal: Normal range of motion. He exhibits no edema and no tenderness.  Lymphadenopathy:    He has no cervical adenopathy.  Neurological: He is alert and oriented to person, place, and time. He has normal reflexes. Coordination normal.  Skin: Skin is warm and dry. No rash noted. He is not diaphoretic. No erythema. No pallor.  Psychiatric: He has a normal mood and affect. His behavior is normal. Judgment and thought content normal.    Data Reviewed I reviewed all of my old office records. I reviewed Dr. Wendall Papa colonoscopy report. I reviewed his CT scan from Dec 03, 2010.   Assessment Ventral incisional hernia, possibly partially incarcerated, midline, above umbilicus. Symptomatic. Patient  desires repair   Anal canal mass, posterior midline. 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   Well-differentiated neuroendocrine tumor of the pancreas, probably benign, no evidence of recurrence almost 3 years postop partial pancreatectomy.   Obesity   History of kidney stones   Hemangioma of right lobe of liver, stable   Plan I told the patient that we should not do rectal surgery simultaneous with the ventral hernia repair because of the implantation of synthetic mesh, and he understands that. He desires that we proceed with a ventral hernia first, and that we delay the  anal fissure and anal canal mass excision until January.   I have discussed the indications and details of surgical repair of his ventral incisional hernia with him. Risks and indications have been outlined, including but limited to bleeding, infection, conversion to open laparotomy, recurrence of the hernia, injury to adjacent organs such as the intestine with major reconstructive surgery, cardiac pulmonary and thromboembolic problems. He seems to understand these issues well. Aall of his questions were answered. He is in agreement with this plan.   Orders are written to schedule him for elective  laparoscopic repair of ventral incisional hernia with mesh, possible open.       Preslynn Bier M 06/10/2011, 10:34 AM                Not recorded             Patient Instructions     Your ventral incisional hernia looks about the same. I think some fatty tissue may be trapped in there but not much. We are going to schedule you for elective repair of this hernia with mesh. Hopefully we will be able to do this laparoscopically. You also have a mass in your anal canal which may be a chronic inflammatory polyp related to your anal fissure. We discussed different logistics to get both surgical problems taking care. It was your decision to have a ventral hernia repaired this year, and to proceed with excision of the anal canal polyp and repaired her anal fissure early next year.   Hernia Repair with Laparoscope A hernia occurs when an internal organ pushes out through a weak spot in the belly (abdominal) wall muscles. Hernias most commonly occur in the groin and around the navel. Hernias can also occur through a cut by the surgeon (incision) after an abdominal operation. A hernia may be caused by: Lifting heavy objects.   Prolonged coughing.   Straining to move your bowels.  Hernias can often be pushed back into place (reduced). Most hernias tend to get worse over time. Problems occur  when abdominal contents get stuck in the opening and the blood supply is blocked or impaired (incarcerated hernia). Because of these risks, you require surgery to repair the hernia. Your hernia will be repaired using a laparoscope. Laparoscopic surgery is a type of minimally invasive surgery. It does not involve making a typical surgical cut (incision) in the skin. A laparoscope is a telescope-like rod and lens system. It is usually connected to a video camera and a light source so your caregiver can clearly see the operative area. The instruments are inserted through  to  inch (5 mm or 10 mm) openings in the skin at specific locations. A working and viewing space is created by blowing a small amount of carbon dioxide gas into the abdominal cavity. The abdomen is essentially blown up like a balloon (insufflated). This elevates the abdominal wall above the internal organs like a dome. The carbon dioxide gas is common to the human body and can be absorbed by tissue and removed by the respiratory system. Once the repair is completed, the small incisions will be closed with either stitches (sutures) or staples (just like a paper stapler only this staple holds the skin together). LET YOUR CAREGIVERS KNOW ABOUT: Allergies.   Medications taken including herbs, eye drops, over the counter medications, and creams.   Use of steroids (by mouth or creams).   Previous problems with anesthetics or Novocaine.   Possibility of pregnancy, if this applies.   History of blood clots (thrombophlebitis).   History of bleeding or blood problems.   Previous surgery.   Other health problems.  BEFORE THE PROCEDURE   Laparoscopy can be done either in a hospital or out-patient clinic. You may be given a mild sedative to help you relax before the procedure. Once in the operating room, you will be given a general anesthesia to make you sleep (unless you and your caregiver choose a different anesthetic).   AFTER THE PROCEDURE     After the procedure you will be watched in a  recovery area. Depending on what type of hernia was repaired, you might be admitted to the hospital or you might go home the same day. With this procedure you may have less pain and scarring. This usually results in a quicker recovery and less risk of infection. HOME CARE INSTRUCTIONS   Bed rest is not required. You may continue your normal activities but avoid heavy lifting (more than 10 pounds) or straining.   Cough gently. If you are a smoker it is best to stop, as even the best hernia repair can break down with the continual strain of coughing.   Avoid driving until given the OK by your surgeon.   There are no dietary restrictions unless given otherwise.   TAKE ALL MEDICATIONS AS DIRECTED.   Only take over-the-counter or prescription medicines for pain, discomfort, or fever as directed by your caregiver.  SEEK MEDICAL CARE IF:   There is increasing abdominal pain or pain in your incisions.   There is more bleeding from incisions, other than minimal spotting.   You feel light headed or faint.   You develop an unexplained fever, chills, and/or an oral temperature above 102 F (38.9 C).   You have redness, swelling, or increasing pain in the wound.   Pus coming from wound.   A foul smell coming from the wound or dressings.  SEEK IMMEDIATE MEDICAL CARE IF:   You develop a rash.   You have difficulty breathing.   You have any allergic problems.  MAKE SURE YOU:   Understand these instructions.   Will watch your condition.   Will get help right away if you are not doing well or get worse.  Document Released: 07/21/2005 Document Revised: 04/02/2011 Document Reviewed: 06/20/2009 [facility] Patient Information 2012 [place], [place].          Level of Service     PR OFFICE/OUTPT VISIT,EST,LEVL III [id number]      Follow-up and Disposition     Return for schedule surgery.        All Flowsheet Templates (all recorded)     Encounter Vitals  Flowsheet    Custom Formula Data Flowsheet    Anthropometrics Flowsheet               Referring Provider          Elberta Fortis Burchette       All Charges for This Encounter       Code Description Service Date Service Provider Modifiers Quantity    99213 PR OFFICE/OUTPT VISIT,EST,LEVL III 06/10/2011 Ernestene Mention, MD   1        Other Encounter Related Information     Allergies & Medications         Problem List         History         Patient-Entered Questionnaires     No data filed

## 2011-07-08 NOTE — Anesthesia Preprocedure Evaluation (Addendum)
Anesthesia Evaluation  Patient identified by MRN, date of birth, ID band Patient awake    Reviewed: Allergy & Precautions, H&P , NPO status , Patient's Chart, lab work & pertinent test results, reviewed documented beta blocker date and time   Airway Mallampati: I TM Distance: >3 FB Neck ROM: Full    Dental  (+) Caps and Dental Advisory Given   Pulmonary  clear to auscultation        Cardiovascular Regular Normal hyperlipidemia   Neuro/Psych    GI/Hepatic   Endo/Other  Diabetes mellitus-, Well Controlled, Type 2, Oral Hypoglycemic AgentsMorbid obesity  Renal/GU      Musculoskeletal   Abdominal (+) obese,   Peds  Hematology   Anesthesia Other Findings   Reproductive/Obstetrics                          Anesthesia Physical Anesthesia Plan  ASA: III  Anesthesia Plan: General   Post-op Pain Management:    Induction: Intravenous  Airway Management Planned: Oral ETT  Additional Equipment:   Intra-op Plan:   Post-operative Plan: Extubation in OR  Informed Consent:   Dental advisory given  Plan Discussed with: CRNA  Anesthesia Plan Comments:         Anesthesia Quick Evaluation

## 2011-07-08 NOTE — OR Nursing (Signed)
Procedure performed: Laparoscopic Ventral Hernia repair with mesh, Lysis of adhesions.

## 2011-07-08 NOTE — Preoperative (Signed)
Beta Blockers   Reason not to administer Beta Blockers:Not Applicable 

## 2011-07-08 NOTE — Anesthesia Procedure Notes (Signed)
Procedure Name: Intubation Date/Time: 07/08/2011 7:30 AM Performed by: Glendora Score Pre-anesthesia Checklist: Patient identified, Emergency Drugs available, Suction available and Patient being monitored Patient Re-evaluated:Patient Re-evaluated prior to inductionOxygen Delivery Method: Circle System Utilized Preoxygenation: Pre-oxygenation with 100% oxygen Intubation Type: IV induction Ventilation: Mask ventilation without difficulty and Oral airway inserted - appropriate to patient size Laryngoscope Size: Hyacinth Meeker and 2 Grade View: Grade I Tube type: Oral Tube size: 7.5 mm Number of attempts: 1 Airway Equipment and Method: stylet Placement Confirmation: ETT inserted through vocal cords under direct vision,  positive ETCO2 and breath sounds checked- equal and bilateral Secured at: 21 cm Tube secured with: Tape Dental Injury: Teeth and Oropharynx as per pre-operative assessment

## 2011-07-08 NOTE — Interval H&P Note (Signed)
History and Physical Interval Note:  07/08/2011 7:06 AM  [name]  has presented today for surgery, with the diagnosis of Painful incisional hernia with obstruction  The various methods of treatment have been discussed with the patient and family. After consideration of risks, benefits and other options for treatment, the patient has consented to  Procedure(s): LAPAROSCOPIC VENTRAL HERNIA WITH MESH as a surgical intervention .  The patients' history has been reviewed today, patient examined today, there is no change in his status, he is stable for surgery.  I have reviewed the patients' chart and labs.  Questions were answered to the patient's satisfaction.     Ernestene Mention

## 2011-07-08 NOTE — Anesthesia Postprocedure Evaluation (Signed)
  Anesthesia Post-op Note  Patient: [name]  Procedure(s) Performed:  LAPAROSCOPIC VENTRAL HERNIA - Laparoscopic repair ventral hernia with mesh, Lysis of adhesions.  Patient Location: PACU  Anesthesia Type: General  Level of Consciousness: awake  Airway and Oxygen Therapy: Patient Spontanous Breathing  Post-op Pain: mild  Post-op Assessment: Post-op Vital signs reviewed  Post-op Vital Signs: stable  Complications: No apparent anesthesia complications

## 2011-07-08 NOTE — Op Note (Signed)
Patient Name:           [name]   Date of Surgery:        07/08/2011  Pre op Diagnosis:      Incarcerated ventral incisional hernia  Post op Diagnosis:    Incarcerated ventral incisional hernia, extensive intra-abdominal adhesions  Procedure:                 Laparoscopic lysis of adhesions requiring 60 minutes, laparoscopic repair of ventral incisional hernia, incarcerated, with 20 x 25 cm Physio-mesh.  Surgeon:                     Angelia Mould. Derrell Lolling, M.D., FACS  Assistant:                      Consuello Bossier, M.D.  Operative Indications:   This is a 50 year old Caucasian male who has a significant history of partial pancreatectomy for neuroendocrine tumor on 08/14/2008. This was a 4.9 cm tumor and his staging evaluation remains negative. He complains of a painful ventral hernia. On exam he weighs 285 pounds and has a bulge above his umbilicus in the midline. CT scan shows a hernia in that location containing fatty tissues. There is his request to go ahead and have this repaired at this time.  Operative Findings:       The patient had significant intra-abdominal adhesions with the omentum incarcerated in the hernia. The transverse colon and small bowel were adhered to the anterior abdominal wall as well but the adhesions were soft and after 60 minutes of dissection we had everything completely dissected away. There was no sign of any tumor. The liver looked normal. The defect above the umbilicus consisted of 1 large and one small defect which were arranged in the midline above and below each other.. The total that defect was probably about 7 cm in diameter.  Procedure in Detail:          Following the induction of general endotracheal anesthesia, a Foley catheter was placed. Intravenous antibodics were given. Surgical time out was held. The abdomen was prepped and draped in a sterile fashion. 0.5% Marcaine with epinephrine was used as a local infiltration anesthetic. A 5 mm optical trocar was  placed in the right upper quadrant. Optical entry was uneventful. Pneumoperitoneum was created. We ultimately placed 7 trocars to dissect the extensive adhesions. All of these were 5 mm trocars except for one 11 mm trocar in the right lateral side.  We spent 60 minutes taking down the adhesions from a variety of angles. Adhesions were taken down with blunt dissection, a cold scissor dissection, and occasional use of the Harmonic scalpel. We ultimately took down the falciform ligament above and had all the adhesions dropped down until there were no adhesions whatsoever.  We then measured the defect using a marking pin and a spinal needle. We attempted to get 5-6 cm overlap of the mesh and so we used a 20 cm wide by 25 cm vertically piece of Physio Mesh. We marked a template on the abdominal wall with a marking pen. We placed 8 fixation sutures of #1 novafil  in the mesh. We inserted the mesh in the abdomen. At the 8  fixation sites we brought these #1 Novafil sutures up and tied them fixing the mesh to the undersurface of the abdominal wall. This covered the defect widely. We then further secured the mesh with a Secure Strap device in  a double crown technique. We probably used about 70 or 75 of these tacks to get good fixation. We inspected this on multiple occasions and it appeared we had good fixation without any gaps or overlap.  We had a little bit of bleeding from one of the suture fixation  that stopped. That was in the left lower quadrant.  We inspected the omentum and the intestinal tract and there was no sign of any injury or bleeding. We suctioned out a small amount of blood on top of the omentum. The pneumoperitoneum was released. The trocars were removed. There was no bleeding from the trocar sites. The skin incisions were closed with skin staples clean bandages were placed.  Counts were correct. There were no complications. EBL was 50 cc.     Angelia Mould. Derrell Lolling, M.D., FACS General and  Minimally Invasive Surgery Breast and Colorectal Surgery  07/08/2011 9:59 AM

## 2011-07-09 ENCOUNTER — Telehealth (INDEPENDENT_AMBULATORY_CARE_PROVIDER_SITE_OTHER): Payer: Self-pay

## 2011-07-09 MED ORDER — HYDROCODONE-ACETAMINOPHEN 5-325 MG PO TABS: 1.0000 | ORAL_TABLET | ORAL | Status: AC | PRN

## 2011-07-09 NOTE — Telephone Encounter (Signed)
LMOM with date of po appt 12-10 arrive at 5:00.

## 2011-07-09 NOTE — Progress Notes (Signed)
1 Day Post-Op  Subjetive: Awake and alert. Has ambulated in the hall. Voiding without difficulty. Has been drinking water. Denies nausea. Says he can eat. Wants to know if he can go home.  Objective: Vital signs in last 24 hours: Temp:  [97.3 F (36.3 C)-98.4 F (36.9 C)] 98.4 F (36.9 C) (12/05 0230) Pulse Rate:  [52-81] 52  (12/05 0230) Resp:  [17-36] 18  (12/05 0230) BP: (120-131)/(59-77) 131/77 mmHg (12/05 0230) SpO2:  [93 %-96 %] 96 % (12/05 0230) Last BM Date: 07/08/11  Intake/Output from previous day: 12/04 0701 - 12/05 0700 In: 2800 [I.V.:2800] Out: 850 [Urine:850] Intake/Output this shift: Total I/O In: -  Out: 550 [Urine:550]  Resp: clear to auscultation bilaterally GI: abdomen is soft. Nondistended. Wounds looked good. Bowel sounds are present.  Lab Results:  Results for orders placed during the hospital encounter of 07/08/11 (from the past 24 hour(s))  GLUCOSE, CAPILLARY     Status: Abnormal   Collection Time   07/08/11 10:14 AM      Component Value Range   Glucose-Capillary 148 (*) 70 - 99 (mg/dL)  GLUCOSE, CAPILLARY     Status: Abnormal   Collection Time   07/08/11 12:13 PM      Component Value Range   Glucose-Capillary 126 (*) 70 - 99 (mg/dL)   Comment 1 Documented in Chart     Comment 2 Notify RN    GLUCOSE, CAPILLARY     Status: Abnormal   Collection Time   07/08/11  4:46 PM      Component Value Range   Glucose-Capillary 122 (*) 70 - 99 (mg/dL)   Comment 1 Documented in Chart     Comment 2 Notify RN    GLUCOSE, CAPILLARY     Status: Abnormal   Collection Time   07/08/11  9:31 PM      Component Value Range   Glucose-Capillary 120 (*) 70 - 99 (mg/dL)     Studies/Results: @RISRSLT24 @     . aspirin  325 mg Oral Daily  . ceFAZolin (ANCEF) IV  2 g Intravenous Q8H  . celecoxib  200 mg Oral BID  . heparin  5,000 Units Subcutaneous To 282  . heparin  5,000 Units Subcutaneous Q8H  . loratadine  10 mg Oral Daily  . metFORMIN  500 mg Oral Q  breakfast  . morphine      . multivitamins ther. w/minerals  1 tablet Oral Daily  . olopatadine  1 drop Both Eyes BID  . pneumococcal 23 valent vaccine  0.5 mL Intramuscular Tomorrow-1000  . polycarbophil  625 mg Oral Daily  . potassium citrate  10 mEq Oral BID  . simvastatin  20 mg Oral q1800  . DISCONTD: ceFAZolin (ANCEF) IV  2 g Intravenous 60 min Pre-Op  . DISCONTD: celecoxib  200 mg Oral Daily  . DISCONTD: Fiber   Oral 1 day or 1 dose  . DISCONTD: Fiber   Oral 1 day or 1 dose  . DISCONTD: glucose blood  1 each Other TID AC  . DISCONTD: multivitamin with iron-minerals  5 mL Oral Daily  . DISCONTD: onetouch ultrasoft  1 each Other TID AC     Assessment/Plan: s/p Procedure(s): LAPAROSCOPIC VENTRAL HERNIA He is doing fairly well. Will advance diet and activities.  Discharge home either today or tomorrow, depending on progress.     Patient Active Hospital Problem List: No active hospital problems.   LOS: 1 day    [name] M 07/09/2011  . .prob

## 2011-07-10 ENCOUNTER — Encounter (HOSPITAL_COMMUNITY): Payer: Self-pay | Admitting: General Surgery

## 2011-07-10 NOTE — Discharge Summary (Signed)
Patient ID: [name] [id number] 50 y.o. Sep 03, 1960  07/08/2011  Discharge date and time: 07/09/2011  2:46 PM  Admitting Physician: Ernestene Mention  Discharge Physician: Ernestene Mention  Admission Diagnoses: Painful incisional hernia with obstruction  Discharge Diagnoses: ventral incisional hernia, incarcerated, with extensive intra-abdominal adhesions.  Operations: Procedure(s): LAPAROSCOPIC VENTRAL HERNIA REPAIR WITH MESH, LYSIS OF ADHESIONS  Admission Condition: good  Discharged Condition: good  Indication for Admission: this 50 year old Caucasian male Has a past history of partial pancreatectomy for neuroendocrine tumor on August 14, 2008. There has been no known recurrence to date. He has developed a ventral hernia in the midline above the umbilicus which causes pain. He has been evaluated as an outpatient. He requests that this be repaired.  Hospital Course: On the day of admission the patient was taken to the operating room. Laparoscopy was performed. We found that he had significant intra-abdominal adhesions with omentum incarcerated in the hernia. We also found that he had extensive adhesions of small bowel and transverse colon to the intra-abdominal wall. This took 60 minutes of tedious dissection to take down, but then we were able to repair the hernia with an inlay mesh.  On postoperative day one the patient felt well and looked well and was begging to go home. We kept him in the hospital until he had eaten breakfast and lunch and he did well with that and so we allowed him to be discharged in the afternoon of postop day one.  He was given a prescription for Vicodin for pain. Diet and activities were discussed. He was to return to see me in the office in one week to get his staples removed.  Consults: none  Significant Diagnostic Studies: none  Treatments: surgery: Laparoscopic lyses of adhesions, laparoscopic repair of ventral hernia with mesh  Disposition:  Home  Patient Instructions:   [name], [name]  Home Medication Instructions [id number]   Printed on:07/10/11 0756  Medication Information                    aspirin 325 MG tablet Take 325 mg by mouth daily.            Multiple Vitamins-Minerals (CENTRUM PO) Take by mouth daily.            FIBER PO Take 1 tablet by mouth daily.            cetirizine (ZYRTEC) 10 MG tablet Take 10 mg by mouth daily.             LORazepam (ATIVAN) 1 MG tablet Take 1 mg by mouth every 8 (eight) hours. Only takes when flying            glucose blood (ONE TOUCH TEST STRIPS) test strip Use as instructed           Lancets (ONETOUCH ULTRASOFT) lancets Use as instructed           olopatadine (PATANOL) 0.1 % ophthalmic solution Place 1 drop into both eyes 2 (two) times daily.           atorvastatin (LIPITOR) 20 MG tablet 20 mg daily.            metFORMIN (GLUCOPHAGE) 500 MG tablet Take 500 mg by mouth daily with breakfast.             potassium citrate (UROCIT-K) 10 MEQ (1080 MG) SR tablet Take 10 mEq by mouth 2 (two) times daily.  celecoxib (CELEBREX) 200 MG capsule Take 1 capsule (200 mg total) by mouth 2 (two) times daily.           HYDROcodone-acetaminophen (NORCO) 5-325 MG per tablet Take 1-2 tablets by mouth every 4 (four) hours as needed for pain.             Activity: no sports or heavy lifting for one month. Okay to drive a car in 5-7 ys. Okay to showe Patient was given deta Diet: diabetic diet Wound Care: as directed  Follow-up:  With Dr. Derrell Lolling in 1 week.  Signed: Angelia Mould. Derrell Lolling, M.D., FACS General and minimally invasive surgery Breast and Colorectal Surgery  07/10/2011, 7:56 AM

## 2011-07-11 NOTE — Progress Notes (Signed)
Utilization review completed. [name] Diane12/02/2011  

## 2011-07-14 ENCOUNTER — Encounter (INDEPENDENT_AMBULATORY_CARE_PROVIDER_SITE_OTHER): Payer: Self-pay | Admitting: General Surgery

## 2011-07-14 ENCOUNTER — Ambulatory Visit (INDEPENDENT_AMBULATORY_CARE_PROVIDER_SITE_OTHER): Payer: BC Managed Care – PPO | Admitting: General Surgery

## 2011-07-14 VITALS — BP 132/84 | HR 80 | Temp 98.0°F | Resp 18 | Ht 75.0 in | Wt 281.0 lb

## 2011-07-14 DIAGNOSIS — Z9889 Other specified postprocedural states: Secondary | ICD-10-CM

## 2011-07-14 DIAGNOSIS — K602 Anal fissure, unspecified: Secondary | ICD-10-CM

## 2011-07-14 NOTE — Progress Notes (Signed)
Subjective:     Patient ID: [name], male   DOB: 08-Aug-1960, 50 y.o.   MRN: [id number]  HPI This patient underwent laparoscopic lysis of adhesions and laparoscopic repair of a ventral incisional hernia with mesh on December 4. We used a 20 x 25 cm piece of mesh and the surgery was uneventful. He was discharged home the next day and has done well since discharge. His appetite is normal. His bowel function is normal. He is here for staple removal.  He also requests that we scheduled his anal fissure surgery. He doesn't really have much rectal pain but he intermittently has bleeding. We know that he has an anal canal mass.  Review of Systems  Constitutional: Negative for fever, chills and unexpected weight change.  HENT: Negative for hearing loss, congestion, sore throat, trouble swallowing and voice change.   Eyes: Negative for visual disturbance.  Respiratory: Negative for cough and wheezing.   Cardiovascular: Negative for chest pain, palpitations and leg swelling.  Gastrointestinal: Positive for anal bleeding. Negative for nausea, vomiting, abdominal pain, diarrhea, constipation, blood in stool, abdominal distention and rectal pain.  Genitourinary: Negative for hematuria and difficulty urinating.  Musculoskeletal: Negative for arthralgias.  Skin: Negative for rash and wound.  Neurological: Negative for seizures, syncope, weakness and headaches.  Hematological: Negative for adenopathy. Does not bruise/bleed easily.  Psychiatric/Behavioral: Negative for confusion.   Past Medical History  Diagnosis Date  . DIAB W/O COMP TYPE II/UNS NOT STATED UNCNTRL 03/30/2009  . SHOULDER PAIN, LEFT 10/20/2008  . BURSITIS, LEFT HIP 05/20/2010  . ELEVATED BLOOD PRESSURE 03/30/2009  . Hyperlipidemia 10/11/2010  . Fissure, anal     occ bleeds still  . Chronic kidney disease     stones  . Cancer 2010    pancreatic cancer  . Arthritis   . Headache     "only when I was working; had headaches and  migraines"  . Claustrophobia     "take Lorazepam if I have to fly"   Current Outpatient Prescriptions  Medication Sig Dispense Refill  . aspirin 325 MG tablet Take 325 mg by mouth daily.       Marland Kitchen atorvastatin (LIPITOR) 20 MG tablet 20 mg daily.       . celecoxib (CELEBREX) 200 MG capsule Take 1 capsule (200 mg total) by mouth 2 (two) times daily.  180 capsule  3  . cetirizine (ZYRTEC) 10 MG tablet Take 10 mg by mouth daily.        Marland Kitchen FIBER PO Take 1 tablet by mouth daily.       Marland Kitchen glucose blood (ONE TOUCH TEST STRIPS) test strip Use as instructed  100 each  12  . HYDROcodone-acetaminophen (NORCO) 5-325 MG per tablet Take 1-2 tablets by mouth every 4 (four) hours as needed for pain.  30 tablet  1  . Lancets (ONETOUCH ULTRASOFT) lancets Use as instructed  100 each  12  . LORazepam (ATIVAN) 1 MG tablet Take 1 mg by mouth every 8 (eight) hours. Only takes when flying       . metFORMIN (GLUCOPHAGE) 500 MG tablet Take 500 mg by mouth daily with breakfast.        . Multiple Vitamins-Minerals (CENTRUM PO) Take by mouth daily.       Marland Kitchen olopatadine (PATANOL) 0.1 % ophthalmic solution Place 1 drop into both eyes 2 (two) times daily.  5 mL  11  . potassium citrate (UROCIT-K) 10 MEQ (1080 MG) SR tablet Take 10 mEq by mouth  2 (two) times daily.         Allergies  Allergen Reactions  . Merthiolate (Thimerosol)     Unknown reaction  . Morphine     REACTION: paronia   History   Social History  . Marital Status: Married    Spouse Name: N/A    Number of Children: N/A  . Years of Education: N/A   Occupational History  . Not on file.   Social History Main Topics  . Smoking status: Never Smoker   . Smokeless tobacco: Never Used  . Alcohol Use: Yes     07/08/11 "maybe 2-3 times a year"  . Drug Use: No  . Sexually Active: Yes   Other Topics Concern  . Not on file   Social History Narrative  . No narrative on file       Objective:   Physical Exam  Constitutional: He appears well-developed  and well-nourished. No distress.  Cardiovascular: Normal rate, regular rhythm, normal heart sounds and intact distal pulses.   No murmur heard. Pulmonary/Chest: Breath sounds normal. No respiratory distress. He has no wheezes. He has no rales. He exhibits no tenderness.  Abdominal: Soft. Bowel sounds are normal. He exhibits no distension and no mass. There is no tenderness. There is no rebound and no guarding.       All of the trocar sites are healing nicely. There is no fluid collection. There is no distention. There is no sign of infection. Staples are removed. He is really not tender.  Skin: He is not diaphoretic.       Assessment:     Ventral incisional hernia, incarcerated,  Doing well in the early post op period  Anal canal mass, posterior midline. 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 He really wants to get this surgery done in December.  Well-differentiated neuroendocrine tumor of the pancreas, probably benign, no evidence of recurrence almost 3 years postop partial pancreatectomy.   Obesity   History of kidney stones   Hemangioma of right lobe of liver, stable     Plan:     Diet and activities discussed. No sports or heavy lifting for 5 weeks from the date of surgery.  We'll schedule for examination under anesthesia, anoscopy, excision of anal canal mass, probable anal stretch and lateral internal sphincterotomy for his presumed anal fissure in the posterior midline.  I discussed the indications and details of the surgery with him. Risks and complications have been outlined, including but not limited to bleeding, infection, sphincter damage with incontinence, nerve damage with chronic pain or numbness, recurrence of fissure or hemorrhoids, cardiac pulmonary and thromboembolic problems. He seems to understand these issues well. At this time all of his questions are answered. He agrees with the plan.

## 2011-07-14 NOTE — Patient Instructions (Signed)
You are healing very well from your laparoscopic ventral hernia repair. You are restricted to 20 pounds lifting and no sports or strenuous activities for 5 weeks from the date of surgery.  You have requested that we proceed with repair of your chronic anal fissure, and we will try to get that scheduled for you.  Anal Fissure, Adult An anal fissure is a small tear or crack in the skin around the anus. Bleeding from a fissure usually stops on its own within a few minutes. However, bleeding will often reoccur with each bowel movement until the crack heals.  CAUSES   Passing large, hard stools.   Frequent diarrheal stools.   Constipation.   Inflammatory bowel disease (Crohn's disease or ulcerative colitis).   Infections.   Anal sex.  SYMPTOMS   Small amounts of blood seen on your stools, on toilet paper, or in the toilet after a bowel movement.   Rectal bleeding.   Painful bowel movements.   Itching or irritation around the anus.  DIAGNOSIS Your caregiver will examine the anal area. An anal fissure can usually be seen with careful inspection. A rectal exam may be performed and a short tube (anoscope) may be used to examine the anal canal. TREATMENT   You may be instructed to take fiber supplements. These supplements can soften your stool to help make bowel movements easier.   Sitz baths may be recommended to help heal the tear. Do not use soap in the sitz baths.   A medicated cream or ointment may be prescribed to lessen discomfort.  HOME CARE INSTRUCTIONS   Maintain a diet high in fruits, whole grains, and vegetables. Avoid constipating foods like bananas and dairy products.   Take sitz baths as directed by your caregiver.   Drink enough fluids to keep your urine clear or pale yellow.   Only take over-the-counter or prescription medicines for pain, discomfort, or fever as directed by your caregiver. Do not take aspirin as this may increase bleeding.   Do not use ointments  containing numbing medications (anesthetics) or hydrocortisone. They could slow healing.  SEEK MEDICAL CARE IF:   Your fissure is not completely healed within 3 days.   You have further bleeding.   You have a fever.   You have diarrhea mixed with blood.   You have pain.   Your problem is getting worse rather than better.  MAKE SURE YOU:   Understand these instructions.   Will watch your condition.   Will get help right away if you are not doing well or get worse.  Document Released: 07/21/2005 Document Revised: 04/02/2011 Document Reviewed: 01/05/2011 [facility] Patient Information 2012 [place], [place].

## 2011-07-17 ENCOUNTER — Encounter (HOSPITAL_COMMUNITY): Payer: Self-pay | Admitting: Pharmacy Technician

## 2011-07-27 NOTE — H&P (Signed)
[name]     MRN: [id number]   Description: 50 year old male  Provider: Ernestene Mention, MD  Department: [facility]             Reason for Visit     Routine Post Op    recent lap. ventral hernia    Anal Fissure    Chronic-wants repair        Vitals - Last Recorded       BP Pulse Temp Resp Ht Wt    132/84  80  98 F (36.7 C)  18  6\' 3"  (1.905 m)  281 lb (127.461 kg)          BMI    35.12 kg/m2                H & P     Ernestene Mention, MD        Patient ID: [name], male   DOB: [birth year], 50 y.o.   MRN: [id number]   HPI This patient underwent laparoscopic lysis of adhesions and laparoscopic repair of a ventral incisional hernia with mesh on December 4. We used a 20 x 25 cm piece of mesh and the surgery was uneventful. He was discharged home the next day and has done well since discharge. His appetite is normal. His bowel function is normal. He is here for staple removal.   He also requests that we scheduled his anal fissure surgery. He doesn't really have much rectal pain but he intermittently has bleeding. We know that he has an anal canal mass.   Review of Systems  Constitutional: Negative for fever, chills and unexpected weight change.  HENT: Negative for hearing loss, congestion, sore throat, trouble swallowing and voice change.   Eyes: Negative for visual disturbance.  Respiratory: Negative for cough and wheezing.   Cardiovascular: Negative for chest pain, palpitations and leg swelling.  Gastrointestinal: Positive for anal bleeding. Negative for nausea, vomiting, abdominal pain, diarrhea, constipation, blood in stool, abdominal distention and rectal pain.  Genitourinary: Negative for hematuria and difficulty urinating.  Musculoskeletal: Negative for arthralgias.  Skin: Negative for rash and wound.  Neurological: Negative for seizures, syncope, weakness and headaches.  Hematological: Negative for adenopathy. Does not bruise/bleed easily.    Psychiatric/Behavioral: Negative for confusion.    Past Medical History   Diagnosis  Date   .  DIAB W/O COMP TYPE II/UNS NOT STATED UNCNTRL  03/30/2009   .  SHOULDER PAIN, LEFT  10/20/2008   .  BURSITIS, LEFT HIP  05/20/2010   .  ELEVATED BLOOD PRESSURE  03/30/2009   .  Hyperlipidemia  10/11/2010   .  Fissure, anal         occ bleeds still   .  Chronic kidney disease         stones   .  Cancer  2010       pancreatic cancer   .  Arthritis     .  Headache         "only when I was working; had headaches and migraines"   .  Claustrophobia         "take Lorazepam if I have to fly"    Current Outpatient Prescriptions   Medication  Sig  Dispense  Refill   .  aspirin 325 MG tablet  Take 325 mg by mouth daily.          Marland Kitchen  atorvastatin (LIPITOR) 20 MG tablet  20 mg  daily.          .  celecoxib (CELEBREX) 200 MG capsule  Take 1 capsule (200 mg total) by mouth 2 (two) times daily.   180 capsule   3   .  cetirizine (ZYRTEC) 10 MG tablet  Take 10 mg by mouth daily.           Marland Kitchen  FIBER PO  Take 1 tablet by mouth daily.          Marland Kitchen  glucose blood (ONE TOUCH TEST STRIPS) test strip  Use as instructed   100 each   12   .  HYDROcodone-acetaminophen (NORCO) 5-325 MG per tablet  Take 1-2 tablets by mouth every 4 (four) hours as needed for pain.   30 tablet   1   .  Lancets (ONETOUCH ULTRASOFT) lancets  Use as instructed   100 each   12   .  LORazepam (ATIVAN) 1 MG tablet  Take 1 mg by mouth every 8 (eight) hours. Only takes when flying          .  metFORMIN (GLUCOPHAGE) 500 MG tablet  Take 500 mg by mouth daily with breakfast.           .  Multiple Vitamins-Minerals (CENTRUM PO)  Take by mouth daily.          Marland Kitchen  olopatadine (PATANOL) 0.1 % ophthalmic solution  Place 1 drop into both eyes 2 (two) times daily.   5 mL   11   .  potassium citrate (UROCIT-K) 10 MEQ (1080 MG) SR tablet  Take 10 mEq by mouth 2 (two) times daily.            Allergies   Allergen  Reactions   .  Merthiolate (Thimerosol)          Unknown reaction   .  Morphine         REACTION: paronia    History       Social History   .  Marital Status:  Married       Spouse Name:  N/A       Number of Children:  N/A   .  Years of Education:  N/A       Occupational History   .  Not on file.       Social History Main Topics   .  Smoking status:  Never Smoker    .  Smokeless tobacco:  Never Used   .  Alcohol Use:  Yes         07/08/11 "maybe 2-3 times a year"   .  Drug Use:  No   .  Sexually Active:  Yes         Objective:    Physical Exam  Constitutional: He appears well-developed and well-nourished. No distress.  Neck: no mass. No JVD. Cardiovascular: Normal rate, regular rhythm, normal heart sounds and intact distal pulses.    No murmur heard. Pulmonary/Chest: Breath sounds normal. No respiratory distress. He has no wheezes. He has no rales. He exhibits no tenderness.  Abdominal: Soft. Bowel sounds are normal. He exhibits no distension and no mass. There is no tenderness. There is no rebound and no guarding.       All of the trocar sites are healing nicely. There is no fluid collection. There is no distention. There is no sign of infection. Staples are removed. He is really not tender.  Skin: He is not diaphoretic. Warm and dry. Rectal:  Probably recently healed anal fissue posterior midline. Small polypoid mass at dentate line posterior midline.     Assessment:      Ventral incisional hernia, incarcerated,  Doing well in the early post op period  Anal canal mass, posterior midline. 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 He really wants to get this surgery done in December.  Well-differentiated neuroendocrine tumor of the pancreas, probably benign, no evidence of recurrence almost 3 years postop partial pancreatectomy.   Obesity   History of kidney stones   Hemangioma of right lobe of liver, stable     Plan:      Diet and  activities discussed. No sports or heavy lifting for 5 weeks from the date of surgery.  We'll schedule for examination under anesthesia, anoscopy, excision of anal canal mass, probable anal stretch and lateral internal sphincterotomy for his presumed anal fissure in the posterior midline.  I discussed the indications and details of the surgery with him. Risks and complications have been outlined, including but not limited to bleeding, infection, sphincter damage with incontinence, nerve damage with chronic pain or numbness, recurrence of fissure or hemorrhoids, cardiac pulmonary and thromboembolic problems. He seems to understand these issues well. At this time all of his questions are answered. He agrees with the plan.         Ernestene Mention 07/27/2011 3:47 PM

## 2011-07-28 ENCOUNTER — Encounter (HOSPITAL_COMMUNITY)
Admission: RE | Disposition: A | Discharge status: Home / Self Care | Payer: Self-pay | Source: Ambulatory Visit | Attending: General Surgery

## 2011-07-28 ENCOUNTER — Ambulatory Visit (HOSPITAL_COMMUNITY): Payer: BC Managed Care – PPO | Admitting: Anesthesiology

## 2011-07-28 ENCOUNTER — Ambulatory Visit (HOSPITAL_COMMUNITY)
Admission: RE | Admit: 2011-07-28 | Discharge: 2011-07-28 | Disposition: A | Discharge status: Home / Self Care | Payer: BC Managed Care – PPO | Source: Ambulatory Visit | Attending: General Surgery | Admitting: General Surgery

## 2011-07-28 ENCOUNTER — Encounter (HOSPITAL_COMMUNITY): Payer: Self-pay | Admitting: Anesthesiology

## 2011-07-28 ENCOUNTER — Encounter (HOSPITAL_COMMUNITY): Payer: Self-pay | Admitting: *Deleted

## 2011-07-28 ENCOUNTER — Other Ambulatory Visit (INDEPENDENT_AMBULATORY_CARE_PROVIDER_SITE_OTHER): Payer: Self-pay | Admitting: General Surgery

## 2011-07-28 DIAGNOSIS — K620 Anal polyp: Secondary | ICD-10-CM | POA: Insufficient documentation

## 2011-07-28 DIAGNOSIS — N189 Chronic kidney disease, unspecified: Secondary | ICD-10-CM | POA: Insufficient documentation

## 2011-07-28 DIAGNOSIS — K601 Chronic anal fissure: Secondary | ICD-10-CM

## 2011-07-28 DIAGNOSIS — E119 Type 2 diabetes mellitus without complications: Secondary | ICD-10-CM | POA: Insufficient documentation

## 2011-07-28 DIAGNOSIS — Z79899 Other long term (current) drug therapy: Secondary | ICD-10-CM | POA: Insufficient documentation

## 2011-07-28 DIAGNOSIS — K602 Anal fissure, unspecified: Secondary | ICD-10-CM

## 2011-07-28 DIAGNOSIS — K621 Rectal polyp: Secondary | ICD-10-CM | POA: Insufficient documentation

## 2011-07-28 DIAGNOSIS — I129 Hypertensive chronic kidney disease with stage 1 through stage 4 chronic kidney disease, or unspecified chronic kidney disease: Secondary | ICD-10-CM | POA: Insufficient documentation

## 2011-07-28 DIAGNOSIS — E785 Hyperlipidemia, unspecified: Secondary | ICD-10-CM | POA: Insufficient documentation

## 2011-07-28 DIAGNOSIS — Z7982 Long term (current) use of aspirin: Secondary | ICD-10-CM | POA: Insufficient documentation

## 2011-07-28 HISTORY — PX: ANAL FISSURE REPAIR: SHX2312

## 2011-07-28 LAB — GLUCOSE, CAPILLARY
Glucose-Capillary: 121 mg/dL — ABNORMAL HIGH (ref 70–99)
Glucose-Capillary: 137 mg/dL — ABNORMAL HIGH (ref 70–99)

## 2011-07-28 SURGERY — LAPAROSCOPIC CHOLECYSTECTOMY WITH INTRAOPERATIVE CHOLANGIOGRAM
Anesthesia: General

## 2011-07-28 SURGERY — REPAIR, FISSURE, ANAL
Anesthesia: General | Site: Anus | Wound class: Clean Contaminated

## 2011-07-28 MED ORDER — BUPIVACAINE LIPOSOME 1.3 % IJ SUSP
20.0000 mL | Freq: Once | INTRAMUSCULAR | Status: AC
  Administered 2011-07-28: 20 mL
  Filled 2011-07-28: qty 20

## 2011-07-28 MED ORDER — LIDOCAINE HCL (CARDIAC) 20 MG/ML IV SOLN
INTRAVENOUS | Status: DC | PRN
  Administered 2011-07-28: 100 mg via INTRAVENOUS

## 2011-07-28 MED ORDER — MIDAZOLAM HCL 5 MG/5ML IJ SOLN
INTRAMUSCULAR | Status: DC | PRN
  Administered 2011-07-28: 2 mg via INTRAVENOUS

## 2011-07-28 MED ORDER — ONDANSETRON HCL 4 MG/2ML IJ SOLN
INTRAMUSCULAR | Status: DC | PRN
  Administered 2011-07-28: 4 mg via INTRAVENOUS

## 2011-07-28 MED ORDER — CEFOXITIN SODIUM-DEXTROSE 1-4 GM-% IV SOLR (PREMIX)
INTRAVENOUS | Status: AC
  Filled 2011-07-28: qty 100

## 2011-07-28 MED ORDER — DEXTROSE 5 % IV SOLN
2.0000 g | INTRAVENOUS | Status: DC
  Filled 2011-07-28: qty 2

## 2011-07-28 MED ORDER — HYDROCODONE-ACETAMINOPHEN 5-325 MG PO TABS: 1.0000 | ORAL_TABLET | ORAL | Status: AC | PRN

## 2011-07-28 MED ORDER — DEXTROSE 5 % IV SOLN
1.0000 g | INTRAVENOUS | Status: DC | PRN
  Administered 2011-07-28: 2 g via INTRAVENOUS

## 2011-07-28 MED ORDER — PROPOFOL 10 MG/ML IV BOLUS
INTRAVENOUS | Status: DC | PRN
  Administered 2011-07-28: 200 mg via INTRAVENOUS

## 2011-07-28 MED ORDER — FENTANYL CITRATE 0.05 MG/ML IJ SOLN
INTRAMUSCULAR | Status: DC | PRN
  Administered 2011-07-28 (×2): 50 ug via INTRAVENOUS

## 2011-07-28 MED ORDER — LACTATED RINGERS IV SOLN
INTRAVENOUS | Status: DC | PRN
  Administered 2011-07-28: 07:00:00 via INTRAVENOUS

## 2011-07-28 MED ORDER — HEPARIN SODIUM (PORCINE) 5000 UNIT/ML IJ SOLN
5000.0000 [IU] | Freq: Once | INTRAMUSCULAR | Status: AC
  Administered 2011-07-28: 5000 [IU] via SUBCUTANEOUS

## 2011-07-28 MED ORDER — FENTANYL CITRATE 0.05 MG/ML IJ SOLN: 25.0000 ug | INTRAMUSCULAR | Status: DC | PRN

## 2011-07-28 MED ORDER — LACTATED RINGERS IV SOLN: INTRAVENOUS | Status: DC

## 2011-07-28 MED ORDER — BUPIVACAINE-EPINEPHRINE PF 0.25-1:200000 % IJ SOLN
INTRAMUSCULAR | Status: AC
  Filled 2011-07-28: qty 30

## 2011-07-28 MED ORDER — HEPARIN SODIUM (PORCINE) 5000 UNIT/ML IJ SOLN
INTRAMUSCULAR | Status: AC
  Filled 2011-07-28: qty 1

## 2011-07-28 MED ORDER — PROMETHAZINE HCL 25 MG/ML IJ SOLN: 6.2500 mg | INTRAMUSCULAR | Status: DC | PRN

## 2011-07-28 MED ORDER — HEPARIN SODIUM (PORCINE) 5000 UNIT/ML IJ SOLN
INTRAMUSCULAR | Status: AC
  Administered 2011-07-28: 5000 [IU] via SUBCUTANEOUS
  Filled 2011-07-28: qty 1

## 2011-07-28 SURGICAL SUPPLY — 23 items
BLADE SURG 15 STRL LF DISP TIS (BLADE) ×1 IMPLANT
BLADE SURG 15 STRL SS (BLADE) ×1
BRIEF STRETCH FOR OB PAD LRG (UNDERPADS AND DIAPERS) ×2 IMPLANT
CLOTH BEACON ORANGE TIMEOUT ST (SAFETY) ×2 IMPLANT
DRAPE LG THREE QUARTER DISP (DRAPES) ×2 IMPLANT
DRAPE POUCH INSTRU U-SHP 10X18 (DRAPES) ×2 IMPLANT
ELECT REM PT RETURN 9FT ADLT (ELECTROSURGICAL) ×2
ELECTRODE REM PT RTRN 9FT ADLT (ELECTROSURGICAL) ×1 IMPLANT
GAUZE SPONGE 4X4 16PLY XRAY LF (GAUZE/BANDAGES/DRESSINGS) ×2 IMPLANT
GLOVE BIOGEL PI IND STRL 7.0 (GLOVE) ×1 IMPLANT
GLOVE BIOGEL PI INDICATOR 7.0 (GLOVE) ×1
GLOVE EUDERMIC 7 POWDERFREE (GLOVE) ×2 IMPLANT
GOWN STRL NON-REIN LRG LVL3 (GOWN DISPOSABLE) ×2 IMPLANT
GOWN STRL REIN XL XLG (GOWN DISPOSABLE) ×2 IMPLANT
KIT BASIN OR (CUSTOM PROCEDURE TRAY) ×2 IMPLANT
LUBRICANT JELLY K Y 4OZ (MISCELLANEOUS) ×2 IMPLANT
NEEDLE HYPO 21X1.5 SAFETY (NEEDLE) ×2 IMPLANT
PACK LITHOTOMY IV (CUSTOM PROCEDURE TRAY) ×2 IMPLANT
PENCIL BUTTON HOLSTER BLD 10FT (ELECTRODE) ×2 IMPLANT
SPONGE GAUZE 4X4 12PLY (GAUZE/BANDAGES/DRESSINGS) ×2 IMPLANT
SYR CONTROL 10ML LL (SYRINGE) ×2 IMPLANT
TOWEL OR 17X26 10 PK STRL BLUE (TOWEL DISPOSABLE) ×4 IMPLANT
YANKAUER SUCT BULB TIP NO VENT (SUCTIONS) ×2 IMPLANT

## 2011-07-28 NOTE — Anesthesia Postprocedure Evaluation (Signed)
  Anesthesia Post-op Note  Patient: [name]  Procedure(s) Performed:  ANAL FISSURE REPAIR - Repair Anal Fissure/sphinterotomy and excision rectal polypl  Patient Location: PACU  Anesthesia Type: General  Level of Consciousness: awake and alert   Airway and Oxygen Therapy: Patient Spontanous Breathing  Post-op Pain: mild  Post-op Assessment: Post-op Vital signs reviewed, Patient's Cardiovascular Status Stable, Respiratory Function Stable, Patent Airway and No signs of Nausea or vomiting  Post-op Vital Signs: stable  Complications: No apparent anesthesia complications

## 2011-07-28 NOTE — Progress Notes (Signed)
2440 Patient able to void post op. Up in room with assistance. VSS. Verbalizes understanding of d/c instructions. Home with wife.

## 2011-07-28 NOTE — Transfer of Care (Signed)
Immediate Anesthesia Transfer of Care Note  Patient: [name]  Procedure(s) Performed:  ANAL FISSURE REPAIR - Repair Anal Fissure/sphinterotomy and excision rectal polypl  Patient Location: PACU  Anesthesia Type: General  Level of Consciousness: sedated  Airway & Oxygen Therapy: Patient Spontanous Breathing and Patient connected to face mask oxygen  Post-op Assessment: Report given to PACU RN and Post -op Vital signs reviewed and stable  Post vital signs: Reviewed and stable  Complications: No apparent anesthesia complications

## 2011-07-28 NOTE — Op Note (Signed)
Patient Name:           [name]   Date of Surgery:        07/28/2011  Pre op Diagnosis:      Chronic anal fissure, posterior midline.     Rectal polyp, posterior midline  Post op Diagnosis:    same  Procedure:                 Examination under anesthesia, lateral internal sphincterotomy and repair anal fissure, transanal excision rectal polyp, posterior midline  Surgeon:                     Angelia Mould. Derrell Lolling, M.D., FACS  Assistant:                      none  Operative Indications:   This is a 50 year old Caucasian male who has a history of distal pancreatectomy for neuroendocrine tumor in 2010. There is no sign of recurrence. He also has had a laparoscopic ventral hernia repair about 6 weeks ago. He has a long history of anal fissure with intermittent pain and bleeding fetal exam he has a mass in the posterior midline just above the dentate line consistent either with 8 adenoma or a inflammatory polyp. He also has chronic scar tissue in the posterior midline consistent with chronic scarring from an anal fissure. He is brought to the operating room electively for sphincterotomy and repair of anal fissure and removal of the rectal polyp  Operative Findings:       There was a multilobulated 1 cm polypoid mass in the posterior midline just above the dentate line. This looks like a benign adenoma. There was chronic scarring in the anal canal anoderm consistent with his history of anal fissure.  Procedure in Detail:          Following the induction of general endotracheal anesthesia the patient was placed in the dorsal lithotomy position. Perianal area was prepped and draped in a sterile fashion. Surgical time out was held.  Examination externally revealed no gross abnormalities. Digital rectal exam revealed normal sphincter tone. I can palpate a soft mass in the posterior midline just above the dentate line. Anoscopy was performed with examination findings as described above.  I positioned the  anoscope so as to expose the right lateral wall of the anal canal. With the anoscope in place I can palpate the intersphincteric groove between the internal and external sphincter muscles. I was able to percutaneously insert a #11 blade between the internal and external sphincters and then I turned the knife the lumen and divided the internal sphincter. I removed the knife and then gently dilated the anal sphincters until I could get 4 fingers there. I held pressure to control bleeding, which  stopped. I then brought Exparel,  50% solution to the operating field and I injected about 35 cc of this circumferentially in the perianal sphincters. I then positioned the anoscope so as to expose the posterior wall of the rectum and anal canal. I grasped the rectal polyp with a Pennington clamp and excised the polyp with electrocautery. I closed the mucosa and submucosa with interrupted sutures of 2-0 Vicryl. The specimen was sent to pathology. I observed all the wounds for about 5 minutes and there was no bleeding. External bandage was placed. Patient tolerated procedure well. EBL 15 cc. Counts correct. Complications none. Patient to recovery room stable.     Angelia Mould. Derrell Lolling, M.D., FACS  General and Minimally Invasive Surgery Breast and Colorectal Surgery  07/28/2011 8:21 AM

## 2011-07-28 NOTE — Interval H&P Note (Signed)
History and Physical Interval Note:  07/28/2011 7:36 AM  [name]  has presented today for surgery, with the diagnosis of anal fissure  The goals of treatment andvarious methods of treatment have been discussed with the patient and family. After consideration of risks, benefits and other options for treatment, the patient has consented to  Procedure(s): ANAL FISSURE REPAIR as a surgical intervention .  The patients' history has been reviewed, patient examined today , there is no change in status, he is stable for surgery.  I have reviewed the patients' chart and labs.  Questions were answered to the patient's satisfaction.     Ernestene Mention 07/28/2011 7:37 AM

## 2011-07-28 NOTE — Anesthesia Preprocedure Evaluation (Addendum)
Anesthesia Evaluation  Patient identified by MRN, date of birth, ID band Patient awake    Reviewed: Allergy & Precautions, H&P , NPO status , Patient's Chart, lab work & pertinent test results  Airway Mallampati: III TM Distance: >3 FB Neck ROM: full    Dental  (+) Caps and Dental Advisory Given,    Pulmonary neg pulmonary ROS,  clear to auscultation  Pulmonary exam normal       Cardiovascular Exercise Tolerance: Good neg cardio ROS regular Normal    Neuro/Psych Anxiety Negative Neurological ROS  Negative Psych ROS   GI/Hepatic negative GI ROS, Neg liver ROS,   Endo/Other  Negative Endocrine ROSDiabetes mellitus-, Well Controlled, Type 2, Oral Hypoglycemic Agents  Renal/GU Renal disease  Genitourinary negative   Musculoskeletal   Abdominal   Peds  Hematology negative hematology ROS (+)   Anesthesia Other Findings   Reproductive/Obstetrics negative OB ROS                          Anesthesia Physical Anesthesia Plan  ASA: III  Anesthesia Plan: General   Post-op Pain Management:    Induction: Intravenous  Airway Management Planned: LMA  Additional Equipment:   Intra-op Plan:   Post-operative Plan:   Informed Consent: I have reviewed the patients History and Physical, chart, labs and discussed the procedure including the risks, benefits and alternatives for the proposed anesthesia with the patient or authorized representative who has indicated his/her understanding and acceptance.   Dental Advisory Given  Plan Discussed with: CRNA  Anesthesia Plan Comments:         Anesthesia Quick Evaluation

## 2011-07-30 ENCOUNTER — Encounter (HOSPITAL_COMMUNITY): Payer: Self-pay | Admitting: General Surgery

## 2011-07-30 NOTE — Progress Notes (Signed)
Quick Note:  Inform patient of Pathology report,. ______ 

## 2011-07-31 ENCOUNTER — Telehealth (INDEPENDENT_AMBULATORY_CARE_PROVIDER_SITE_OTHER): Payer: Self-pay

## 2011-07-31 NOTE — Telephone Encounter (Signed)
Pt notified of path result and po appt made.  

## 2011-08-15 MED FILL — Mupirocin Oint 2%: CUTANEOUS | Qty: 22 | Status: AC

## 2011-08-25 ENCOUNTER — Encounter (INDEPENDENT_AMBULATORY_CARE_PROVIDER_SITE_OTHER): Payer: Self-pay | Admitting: General Surgery

## 2011-08-25 ENCOUNTER — Ambulatory Visit (INDEPENDENT_AMBULATORY_CARE_PROVIDER_SITE_OTHER): Payer: BC Managed Care – PPO | Admitting: General Surgery

## 2011-08-25 VITALS — BP 122/80 | HR 84 | Temp 97.0°F | Resp 16 | Ht 75.0 in | Wt 279.6 lb

## 2011-08-25 DIAGNOSIS — Z9889 Other specified postprocedural states: Secondary | ICD-10-CM

## 2011-08-25 NOTE — Progress Notes (Signed)
Subjective:     Patient ID: [name], male   DOB: 1960-11-27, 51 y.o.   MRN: [id number]  HPI  This 51 year old gentleman returns for postop followup.  He underwent laparoscopic repair of a ventral incisional hernia approximately one month ago. He is doing fine from that although he still feels some tenderness on the right side of his abdomen. His appetite is normal. His bowel function is normal. He thinks he is gaining weight.  On December 24 he underwent repair of anal fissure with lateral internal sphincterotomy and excision of the hemorrhoid. The final pathology of the hemorrhoid shows a benign hemorrhoidal tissue. He has done great well postoperative surgery. He has no more bleeding and no pain he feels in his rectum is normal. Review of Systems     Objective:   Physical Exam Patient looks well. He is in no distress. Height 6 feet 3. Weight 280 pounds.  Abdomen - soft. All the trocar sites are well healed. There is no seroma. No evidence of recurrence. He is slightly tender on the right side but nothing very severe. Nothing to suggest neuroma or nerve entrapment.   No  infection.  Rectal - external exam shows that the wounds have almost completely healed no tenderness. No external mass.    Assessment:     Status post lateral internal sphincterotomy, internal hemorrhoidectomy, and repair anal fissure, recovering uneventfully and now asymptomatic.  Status post ventral incisional hernia repair. Healing uneventfully. Right-sided abdominal tenderness is probably simply incisional pain.  Remote history partial pancreatectomy for resection of the neuro endocrine tumor of the pancreas, 4.9 cm, well-differentiated, no evidence of recurrence.    Plan:     Weight reduction advised.  High fiber, low fat diet with hydration advised.  Okay to resume all normal physical activities without restriction  6 weeks following the hernia repair  Return to see me if there are any  problems.  Angelia Mould. Derrell Lolling, M.D., [facility] General and Minimally invasive Surgery Breast and Colorectal Surgery Office:   [phone] Pager:   [phone]

## 2011-08-25 NOTE — Patient Instructions (Signed)
Your hernia repair is solidly intact. The tenderness on the right side of the abdomen I think is just muscle pain. You may resume all normal physical activities without restriction 6 weeks following the date of her hernia surgery. Be sure to stretch before and after exercise.  You seem to be doing very well from your rectal surgery. I advise daily stool softeners and lots of hydration.  As we discussed, it would be in your best interest to lost weight.  Return to see me if there are any further problems.

## 2011-09-25 ENCOUNTER — Telehealth: Payer: Self-pay | Admitting: Family Medicine

## 2011-09-25 NOTE — Telephone Encounter (Signed)
Pt called and said that a form from [facility] was filled in by PCP and faxed back, but form was not completed. Pt said that either Question # 3 or #4 was left bland and needs to be filled in then faxed back to [employer]. Pls notify pt when this has been done.

## 2011-09-25 NOTE — Telephone Encounter (Signed)
[name], please inform pt this form is being scanned to his chart and so we cannot retrieve it for awhile.  Perhaps [employer] can fax back what they received for [place] to add to. Thank you

## 2011-09-25 NOTE — Telephone Encounter (Signed)
Called pt and notified him that document has gone to scan and that we could not retrieve at this time. Pt said that [employer] just needs to know that pt is not taking anything oral for diabetes. That was the only question left unanswered. Pt said that [employer] telephone # is [phone]. Pls call [facility] to see if nurse can give the answer to question.

## 2011-09-26 NOTE — Telephone Encounter (Signed)
Form completed again, faxed and confirmation received.  Tried to call pt, phone did not work?  I called [employer] and they will contact [name].

## 2011-10-12 ENCOUNTER — Other Ambulatory Visit: Payer: Self-pay | Admitting: Family Medicine

## 2011-11-10 ENCOUNTER — Other Ambulatory Visit: Payer: Self-pay | Admitting: *Deleted

## 2011-11-10 MED ORDER — METFORMIN HCL 500 MG PO TABS: 500.0000 mg | ORAL_TABLET | Freq: Two times a day (BID) | ORAL | Status: DC

## 2012-03-11 ENCOUNTER — Other Ambulatory Visit: Payer: Self-pay | Admitting: *Deleted

## 2012-03-11 MED ORDER — ATORVASTATIN CALCIUM 20 MG PO TABS: 20.0000 mg | ORAL_TABLET | ORAL | Status: DC

## 2012-04-02 ENCOUNTER — Other Ambulatory Visit: Payer: Self-pay | Admitting: *Deleted

## 2012-04-02 MED ORDER — LORAZEPAM 1 MG PO TABS: 1.0000 mg | ORAL_TABLET | Freq: Three times a day (TID) | ORAL | Status: DC

## 2012-04-02 NOTE — Telephone Encounter (Signed)
Refill okay?  

## 2012-04-02 NOTE — Telephone Encounter (Signed)
Ativan refill request for flying as pt has 2 up-coming trips. Last filled #30 with 0 refills on 03-06-11 Pt has CPX scheduled for 08/27/12

## 2012-07-29 ENCOUNTER — Telehealth (INDEPENDENT_AMBULATORY_CARE_PROVIDER_SITE_OTHER): Payer: Self-pay

## 2012-07-29 NOTE — Telephone Encounter (Signed)
Pt's wife called stating pt is due for f/u Ct from pancreatic tumor removed. Gets yearly reck. Can reach pt at [phone]. Pt aware may be next week when MD back in office before we can get order for CT.

## 2012-08-03 ENCOUNTER — Other Ambulatory Visit: Payer: Self-pay

## 2012-08-03 MED ORDER — CELECOXIB 200 MG PO CAPS: 200.0000 mg | ORAL_CAPSULE | Freq: Two times a day (BID) | ORAL | Status: DC

## 2012-08-03 MED ORDER — ATORVASTATIN CALCIUM 20 MG PO TABS: 20.0000 mg | ORAL_TABLET | ORAL | Status: DC

## 2012-08-05 ENCOUNTER — Other Ambulatory Visit (INDEPENDENT_AMBULATORY_CARE_PROVIDER_SITE_OTHER): Payer: Self-pay | Admitting: General Surgery

## 2012-08-05 ENCOUNTER — Telehealth (INDEPENDENT_AMBULATORY_CARE_PROVIDER_SITE_OTHER): Payer: Self-pay | Admitting: General Surgery

## 2012-08-05 DIAGNOSIS — Z9889 Other specified postprocedural states: Secondary | ICD-10-CM

## 2012-08-05 NOTE — Telephone Encounter (Signed)
Called to advise CT has been ordered. Gave the contact number for [facility] scheduling in order to contact the department directly to schedule the appointment based on their schedule. Advised for call to be returned if there were any other questions.

## 2012-08-06 ENCOUNTER — Encounter: Payer: Self-pay | Admitting: Family Medicine

## 2012-08-06 ENCOUNTER — Telehealth: Payer: Self-pay | Admitting: Family Medicine

## 2012-08-06 NOTE — Telephone Encounter (Signed)
error 

## 2012-08-11 ENCOUNTER — Other Ambulatory Visit: Payer: Self-pay | Admitting: Family Medicine

## 2012-08-11 MED ORDER — POTASSIUM CITRATE ER 10 MEQ (1080 MG) PO TBCR: 10.0000 meq | EXTENDED_RELEASE_TABLET | Freq: Two times a day (BID) | ORAL | Status: DC

## 2012-08-11 NOTE — Telephone Encounter (Signed)
Samples provided 

## 2012-08-11 NOTE — Telephone Encounter (Signed)
Patient called stating that he would like samples of celebrex until he can get his rx approved and will be in this area at lunch time. Please assist.

## 2012-08-17 ENCOUNTER — Telehealth (INDEPENDENT_AMBULATORY_CARE_PROVIDER_SITE_OTHER): Payer: Self-pay | Admitting: General Surgery

## 2012-08-17 NOTE — Telephone Encounter (Signed)
Called [name] back based on message left. She wanted to know if her husband should not take his metformin before he has his test. I advised her to contact the testing facility directly as they would be better able to answer what may or may not conflict with the contrast given to the patient.

## 2012-08-18 ENCOUNTER — Encounter: Payer: Self-pay | Admitting: Family Medicine

## 2012-08-20 ENCOUNTER — Encounter: Payer: Self-pay | Admitting: Family Medicine

## 2012-08-20 ENCOUNTER — Other Ambulatory Visit (INDEPENDENT_AMBULATORY_CARE_PROVIDER_SITE_OTHER): Payer: BC Managed Care – PPO

## 2012-08-20 DIAGNOSIS — Z0000 Encounter for general adult medical examination without abnormal findings: Secondary | ICD-10-CM

## 2012-08-20 LAB — LIPID PANEL
Cholesterol: 157 mg/dL (ref 0–200)
HDL: 36.1 mg/dL — ABNORMAL LOW (ref >39.00)
Triglycerides: 177 mg/dL — ABNORMAL HIGH (ref 0.0–149.0)
VLDL: 35.4 mg/dL (ref 0.0–40.0)

## 2012-08-20 LAB — BASIC METABOLIC PANEL
CO2: 27 mEq/L (ref 19–32)
Calcium: 9.1 mg/dL (ref 8.4–10.5)
Creatinine, Ser: 0.7 mg/dL (ref 0.4–1.5)

## 2012-08-20 LAB — POCT URINALYSIS DIPSTICK
Bilirubin, UA: NEGATIVE
Glucose, UA: NEGATIVE
Ketones, UA: NEGATIVE
Spec Grav, UA: 1.025

## 2012-08-20 LAB — CBC WITH DIFFERENTIAL/PLATELET
Basophils Absolute: 0 10*3/uL (ref 0.0–0.1)
Basophils Relative: 0.3 % (ref 0.0–3.0)
Eosinophils Absolute: 0.2 10*3/uL (ref 0.0–0.7)
Lymphocytes Relative: 33.7 % (ref 12.0–46.0)
MCHC: 34.1 g/dL (ref 30.0–36.0)
Neutrophils Relative %: 52.4 % (ref 43.0–77.0)
RBC: 4.9 Mil/uL (ref 4.22–5.81)

## 2012-08-20 LAB — HEPATIC FUNCTION PANEL
Alkaline Phosphatase: 52 U/L (ref 39–117)
Bilirubin, Direct: 0.1 mg/dL (ref 0.0–0.3)
Total Protein: 7.2 g/dL (ref 6.0–8.3)

## 2012-08-20 LAB — PSA: PSA: 0.45 ng/mL (ref 0.10–4.00)

## 2012-08-27 ENCOUNTER — Encounter: Payer: Self-pay | Admitting: Family Medicine

## 2012-08-27 ENCOUNTER — Ambulatory Visit (INDEPENDENT_AMBULATORY_CARE_PROVIDER_SITE_OTHER): Payer: BC Managed Care – PPO | Admitting: Family Medicine

## 2012-08-27 VITALS — BP 110/68 | HR 72 | Temp 98.3°F | Resp 12 | Ht 74.5 in | Wt 270.0 lb

## 2012-08-27 DIAGNOSIS — Z0000 Encounter for general adult medical examination without abnormal findings: Secondary | ICD-10-CM

## 2012-08-27 DIAGNOSIS — Z87442 Personal history of urinary calculi: Secondary | ICD-10-CM

## 2012-08-27 MED ORDER — CELECOXIB 200 MG PO CAPS: 200.0000 mg | ORAL_CAPSULE | Freq: Two times a day (BID) | ORAL | Status: DC

## 2012-08-27 MED ORDER — LORAZEPAM 1 MG PO TABS: 1.0000 mg | ORAL_TABLET | Freq: Three times a day (TID) | ORAL | Status: DC

## 2012-08-27 MED ORDER — ATORVASTATIN CALCIUM 20 MG PO TABS: 20.0000 mg | ORAL_TABLET | ORAL | Status: DC

## 2012-08-27 MED ORDER — POTASSIUM CITRATE ER 10 MEQ (1080 MG) PO TBCR: 10.0000 meq | EXTENDED_RELEASE_TABLET | Freq: Two times a day (BID) | ORAL | Status: DC

## 2012-08-27 NOTE — Progress Notes (Signed)
Subjective:    Patient ID: [name], male    DOB: 1961-01-25, 52 y.o.   MRN: [id number]  HPI Patient seen for complete physical. He has chronic problems including history of type 2 diabetes, hyperlipidemia, history of multiple kidney stones, and some chronic shoulder pains. Colonoscopy up to date. Immunizations are up-to-date. Patient does some walking for exercise but plans to increase this soon.  He's had multiple surgeries as outlined elsewhere. He's had previous pancreatic mass excision which turned out to be benign. He has followup with surgeon soon  Medications reviewed. He needs refills of medication including Lipitor, Celebrex, and potassium citrate. He also takes lorazepam intermittently as needed for flying.  Past Medical History  Diagnosis Date  . DIAB W/O COMP TYPE II/UNS NOT STATED UNCNTRL 03/30/2009  . SHOULDER PAIN, LEFT 10/20/2008  . BURSITIS, LEFT HIP 05/20/2010  . ELEVATED BLOOD PRESSURE 03/30/2009  . Hyperlipidemia 10/11/2010  . Fissure, anal     occ bleeds still  . Chronic kidney disease     stones  . Cancer 2010    pancreatic cancer  . Arthritis   . Headache     "only when I was working; had headaches and migraines"  . Claustrophobia     "take Lorazepam if I have to fly"   Past Surgical History  Procedure Date  . Lipoma excision     "several; off back, arms"  . Biopsy pancreas 2010    benign  . Kidney stones 2010 & 2008 & 2005  . Vasectomy 1994  . Pancreatic tumor 2010  . Right rotator cuff     2011  . Left rotator cuff 02/26/2011  . Left shoulder bone spurs 2011  . Tonsillectomy 1967  . Hernia repair 2001    umbilical  . Laparoscopic incisional / umbilical / ventral hernia repair 07/08/11    VHR  . Back surgery 2000    "had 2 cages put in"  . Ventral hernia repair 07/08/2011    Procedure: LAPAROSCOPIC VENTRAL HERNIA;  Surgeon: Ernestene Mention, MD;  Location: [facility] OR;  Service: General;  Laterality: N/A;  Laparoscopic repair ventral hernia with mesh,  Lysis of adhesions.  . Anal fissure repair 07/28/2011    Procedure: ANAL FISSURE REPAIR;  Surgeon: Ernestene Mention, MD;  Location: [facility] ORS;  Service: General;  Laterality: N/A;  Repair Anal Fissure/sphinterotomy and excision rectal polypl    reports that he has never smoked. He has never used smokeless tobacco. He reports that he drinks alcohol. He reports that he does not use illicit drugs. family history includes Cancer in his maternal grandfather; Colon cancer in his maternal grandfather; Diabetes in his brother; Heart disease in his father; and Stroke in his father. Allergies  Allergen Reactions  . Merthiolate (Thimerosol)     Unknown reaction  . Morphine     REACTION: paronia      Review of Systems  Constitutional: Negative for fever, activity change, appetite change, fatigue and unexpected weight change.  HENT: Negative for ear pain, congestion and trouble swallowing.   Eyes: Negative for pain and visual disturbance.  Respiratory: Negative for cough, shortness of breath and wheezing.   Cardiovascular: Negative for chest pain and palpitations.  Gastrointestinal: Negative for nausea, vomiting, abdominal pain, diarrhea, constipation, blood in stool, abdominal distention and rectal pain.  Genitourinary: Negative for dysuria, hematuria and testicular pain.  Musculoskeletal: Negative for joint swelling.  Skin: Negative for rash.  Neurological: Negative for dizziness, syncope and headaches.  Hematological: Negative for  adenopathy.  Psychiatric/Behavioral: Negative for confusion and dysphoric mood.       Objective:   Physical Exam  Constitutional: He is oriented to person, place, and time. He appears well-developed and well-nourished. No distress.  HENT:  Head: Normocephalic and atraumatic.  Right Ear: External ear normal.  Left Ear: External ear normal.  Mouth/Throat: Oropharynx is clear and moist.  Eyes: Conjunctivae normal and EOM are normal. Pupils are equal, round, and  reactive to light.  Neck: Normal range of motion. Neck supple. No thyromegaly present.  Cardiovascular: Normal rate, regular rhythm and normal heart sounds.   No murmur heard. Pulmonary/Chest: No respiratory distress. He has no wheezes. He has no rales.  Abdominal: Soft. Bowel sounds are normal. He exhibits no distension and no mass. There is no tenderness. There is no rebound and no guarding.  Musculoskeletal: He exhibits no edema.  Lymphadenopathy:    He has no cervical adenopathy.  Neurological: He is alert and oriented to person, place, and time. He displays normal reflexes. No cranial nerve deficit.  Skin: No rash noted.  Psychiatric: He has a normal mood and affect.          Assessment & Plan:  Complete physical. Labs reviewed with patient and all favorable with exception of low HDL. Recommend weight loss and establishing consistent exercise. Colonoscopy up to date. Immunizations up-to-date. Refilled medications above

## 2012-08-30 ENCOUNTER — Encounter (HOSPITAL_COMMUNITY): Payer: Self-pay

## 2012-08-30 ENCOUNTER — Telehealth (INDEPENDENT_AMBULATORY_CARE_PROVIDER_SITE_OTHER): Payer: Self-pay | Admitting: General Surgery

## 2012-08-30 ENCOUNTER — Ambulatory Visit (HOSPITAL_COMMUNITY)
Admission: RE | Admit: 2012-08-30 | Discharge: 2012-08-30 | Disposition: A | Discharge status: Home / Self Care | Payer: BC Managed Care – PPO | Source: Ambulatory Visit | Attending: General Surgery | Admitting: General Surgery

## 2012-08-30 DIAGNOSIS — N200 Calculus of kidney: Secondary | ICD-10-CM | POA: Insufficient documentation

## 2012-08-30 DIAGNOSIS — K8689 Other specified diseases of pancreas: Secondary | ICD-10-CM | POA: Insufficient documentation

## 2012-08-30 DIAGNOSIS — K7689 Other specified diseases of liver: Secondary | ICD-10-CM | POA: Insufficient documentation

## 2012-08-30 DIAGNOSIS — I7090 Unspecified atherosclerosis: Secondary | ICD-10-CM | POA: Insufficient documentation

## 2012-08-30 DIAGNOSIS — K439 Ventral hernia without obstruction or gangrene: Secondary | ICD-10-CM | POA: Insufficient documentation

## 2012-08-30 DIAGNOSIS — N289 Disorder of kidney and ureter, unspecified: Secondary | ICD-10-CM | POA: Insufficient documentation

## 2012-08-30 MED ORDER — IOHEXOL 300 MG/ML  SOLN: 100.0000 mL | Freq: Once | INTRAMUSCULAR | Status: DC | PRN

## 2012-08-30 MED ORDER — IOHEXOL 300 MG/ML  SOLN
125.0000 mL | Freq: Once | INTRAMUSCULAR | Status: AC | PRN
  Administered 2012-08-30: 125 mL via INTRAVENOUS

## 2012-08-30 NOTE — Telephone Encounter (Signed)
Called patient to advise of radiology report per Dr. Derrell Lolling: "Good news, looks normal".  Patient wanted to know if the results would be given to Dr. Caryl Never. I advised that he has access to our system so he would be able to see the results. Patient wanted to know if there was anything wrong with his kidneys because he was at D. Burchette's office last week and was told the labs showed a trace amount of blood in his urine. Advised patient to contact Dr. Lucie Leather office regarding lab results.  Patient wanted to know if he needed to keep his appointment with Dr. Derrell Lolling based on CT results. I advised I would check with Dr. Derrell Lolling first and if the appointment would be kept or cancelled I would let him know. Patient agreed.

## 2012-08-30 NOTE — Telephone Encounter (Signed)
Called and left a message to advise the appointment with Dr. Derrell Lolling will be cancelled per Dr. Jacinto Halim reply. Advised for the patient to call back if there are any questions.

## 2012-08-31 ENCOUNTER — Telehealth (INDEPENDENT_AMBULATORY_CARE_PROVIDER_SITE_OTHER): Payer: Self-pay | Admitting: General Surgery

## 2012-08-31 ENCOUNTER — Telehealth: Payer: Self-pay | Admitting: Family Medicine

## 2012-08-31 NOTE — Telephone Encounter (Signed)
Pt's wife called to ask about instructions following pt's CT done yesterday.  CT performed with IV and po contrast.  Post-procedure instructions were to consider renal function test before restarting Metformin.  Advised her to call Dr. Caryl Never (who manages his diabetes) for orders.  She agrees and will comply.

## 2012-08-31 NOTE — Telephone Encounter (Signed)
Patient Information:  Caller Name: Marisue Humble  Phone: [phone]  Patient: Cody, Frye  Gender: Male  DOB: 09-20-1960  Age: 52 Years  PCP: Evelena Peat (Family Practice)  Office Follow Up:  Does the office need to follow up with this patient?: Yes  Instructions For The Office: Please inform patient if he needs to have kidney function tests done before resuming his Metformin.  Thank you.   Symptoms  Reason For Call & Symptoms: CT with Contrast done 08/30/12 so had to stop Metformin for 48 hours. Instructions are to follow up with PCP regarding possible kidney function tests before resuming Metformin.  Caller states the provider who ordered the CT was Dr. Derrell Lolling, GI Surgeon and she has been referred back to this office for clarification on this matter.  Reviewed Health History In EMR: Yes  Reviewed Medications In EMR: Yes  Reviewed Allergies In EMR: Yes  Reviewed Surgeries / Procedures: Yes  Date of Onset of Symptoms: Unknown  Guideline(s) Used:  No Protocol Available - Information Only  Disposition Per Guideline:   Discuss with PCP and Callback by Nurse Today  Reason For Disposition Reached:   Nursing judgment  Advice Given:  Call Back If:  New symptoms develop  You become worse.

## 2012-09-01 ENCOUNTER — Encounter: Payer: Self-pay | Admitting: Family Medicine

## 2012-09-01 NOTE — Telephone Encounter (Signed)
Should be safe to start back after 48 hours since he has hx of normal kidney function.

## 2012-09-01 NOTE — Telephone Encounter (Signed)
Spoke to [place] told her should be safe for him to start back after 48 hours since he has hx of  Normal kidney function. Maureen verbalized understanding.

## 2012-09-10 ENCOUNTER — Telehealth (INDEPENDENT_AMBULATORY_CARE_PROVIDER_SITE_OTHER): Payer: Self-pay | Admitting: General Surgery

## 2012-09-10 NOTE — Telephone Encounter (Signed)
I called [name] to discuss his CT scan report. He was not home and I left a message with one of his family members to call me at his convenience.  Angelia Mould. Derrell Lolling, M.D., [facility] General and Minimally invasive Surgery Breast and Colorectal Surgery Office:   [phone] Pager:   [phone]

## 2012-09-10 NOTE — Telephone Encounter (Signed)
Pt's wife, identified herself as a Engineer, civil (consulting), called to express concern about the recent CT report.  They downloaded a copy of the report from MyChart.  While they are happy with the good report on the pancreas, the wife is very concerned about mention of a lesion on the liver.  She would like a call from Dr. Derrell Lolling to discuss this.  Offered and made an appt to come to the office, with the pt, to discuss with Dr. Derrell Lolling (appt is on 10/07/12) if he prefers to speak with both in person.  Wife states she is amenable to coming in or a phone call, either one.  Please advise.

## 2012-09-14 ENCOUNTER — Telehealth (INDEPENDENT_AMBULATORY_CARE_PROVIDER_SITE_OTHER): Payer: Self-pay | Admitting: General Surgery

## 2012-09-14 NOTE — Telephone Encounter (Signed)
Called and left message for patient to advise the appointment on 10/07/12 has been moved from 9:30 to 10:15 due to a surgery scheduled that morning. Message left with family member as the patient and his wife were not home at the time of the call.

## 2012-09-18 ENCOUNTER — Other Ambulatory Visit: Payer: Self-pay

## 2012-09-20 ENCOUNTER — Encounter (INDEPENDENT_AMBULATORY_CARE_PROVIDER_SITE_OTHER): Payer: BC Managed Care – PPO | Admitting: General Surgery

## 2012-09-28 ENCOUNTER — Encounter: Payer: Self-pay | Admitting: Family Medicine

## 2012-10-02 ENCOUNTER — Ambulatory Visit (INDEPENDENT_AMBULATORY_CARE_PROVIDER_SITE_OTHER): Payer: BC Managed Care – PPO | Admitting: Family Medicine

## 2012-10-02 ENCOUNTER — Encounter: Payer: Self-pay | Admitting: Family Medicine

## 2012-10-02 VITALS — BP 140/86 | HR 64 | Temp 97.3°F | Resp 18 | Wt 272.1 lb

## 2012-10-02 DIAGNOSIS — R319 Hematuria, unspecified: Secondary | ICD-10-CM

## 2012-10-02 NOTE — Progress Notes (Signed)
  Subjective:    Patient ID: [name], male    DOB: 11/19/1960, 52 y.o.   MRN: [id number]  HPI Here for 6 days of blood in the urine. He has no other symptoms at all, no pain or fever or nausea. He has had multiple kidney stones in the past. He had a CT scan in January showing a 4mm stone in the right kidney. He had seen Dr. Vonita Moss in the past. He last stone was 4 years ago.    Review of Systems  Constitutional: Negative.   Respiratory: Negative.   Cardiovascular: Negative.   Gastrointestinal: Negative.   Genitourinary: Positive for hematuria. Negative for dysuria, urgency, frequency, flank pain, difficulty urinating and testicular pain.       Objective:   Physical Exam  Constitutional: He appears well-developed and well-nourished. No distress.  Abdominal: Soft. Bowel sounds are normal. He exhibits no distension and no mass. There is no tenderness. There is no rebound and no guarding.          Assessment & Plan:  Hematuria, probably from the right renal stone. It does not sound like it has oassed into the ureter at this point. Advised him to drink plenty of water. We will get him in to see Urology early next week.

## 2012-10-04 ENCOUNTER — Telehealth: Payer: Self-pay | Admitting: Family Medicine

## 2012-10-04 NOTE — Telephone Encounter (Signed)
[facility] Triage Call Report Triage Record Num: [id number] Operator: Albertine Grates Patient Name: [name] Call Date & Time: 10/01/2012 9:54:12PM Patient Phone: [phone] PCP: Evelena Peat Patient Gender: Male PCP Fax : [phone] Patient DOB: 02-Aug-1961 [facility] Reason for Call: Caller: Maureen/Spouse; PCP: Evelena Peat (Family Practice); CB#: [phone]; Has blood in urine since 2-27. Denies painful urination. Afebrile. Per Bloody Urine protocol, appointment scheduled for 3-1 at 1000 in [place] office due to 1 or more urinary tract symptoms. Protocol(s) Used: Bloody Urine Recommended Outcome per Protocol: See Provider within 24 hours Reason for Outcome: Has one or more urinary tract symptoms AND has not been previously evaluated Care Advice: Limit carbonated, alcoholic, and caffeinated beverages such as coffee, tea and soda. Avoid nonprescription cold and allergy medications that contain caffeine. Limit intake of tomatoes, fruit juices (except for unsweetened cranberry juice), dairy products, spicy foods, sugar, and artificial sweeteners (aspartame or saccharine). Stop or decrease smoking. Reducing exposure to bladder irritants may help lessen urgency. ~ Increase intake of fluids to 8-16 eight oz. glasses (2,000 to 4,000 cc per day or 1.6 to 3.2 L) so urine is a pale yellow color, to help flush bacteria from the bladder, unless instructed to restrict fluid intake for other medical reasons. Limit fluids with sugar. ~ 02/28/

## 2012-10-07 ENCOUNTER — Ambulatory Visit (INDEPENDENT_AMBULATORY_CARE_PROVIDER_SITE_OTHER): Payer: BC Managed Care – PPO | Admitting: General Surgery

## 2012-10-07 ENCOUNTER — Encounter (INDEPENDENT_AMBULATORY_CARE_PROVIDER_SITE_OTHER): Payer: Self-pay | Admitting: General Surgery

## 2012-10-07 VITALS — BP 120/70 | HR 60 | Temp 97.8°F | Resp 12 | Ht 74.5 in | Wt 274.0 lb

## 2012-10-07 DIAGNOSIS — K7689 Other specified diseases of liver: Secondary | ICD-10-CM | POA: Insufficient documentation

## 2012-10-07 DIAGNOSIS — D3A8 Other benign neuroendocrine tumors: Secondary | ICD-10-CM | POA: Insufficient documentation

## 2012-10-07 DIAGNOSIS — C7A1 Malignant poorly differentiated neuroendocrine tumors: Secondary | ICD-10-CM

## 2012-10-07 NOTE — Patient Instructions (Signed)
Your physical exam today is normal. There are no enlarged lymph nodes, no abdominal masses, and the hernia repair is intact.  Your recent CT scan of the abdomen shows the small nodule in the right lobe of the liver to be essentially unchanged. High probability that this is a benign process such as a cavernous hemangioma. The area around her pancreatic resection looks good. No sign of recurrent tumor.  Return to see Dr. Derrell Lolling in January 2015 after you get your next CT scan of the liver.

## 2012-10-07 NOTE — Progress Notes (Signed)
Patient ID: [name], male   DOB: Mar 19, 1961, 52 y.o.   MRN: [id number]  Chief Complaint  Patient presents with  . Follow-up    discuss ct    HPI CODI [name] is a 52 y.o. male.  He returned to see me in long-term followup of his partial pancreatectomy for neuroendocrine tumor, but for hernia repair, and right liver nodule, suspect meningioma.  On 08/14/2008 he underwent partial pancreatectomy. A well-differentiated neuroendocrine tumor of the pancreas, probably benign, 4.9 cm. No lymphovascular invasion or perineural invasion noted. He has no known recurrence to date. He has been known to have a small nodule in the lateral aspect of the right lobe of the liver. This has been stable. CT scan in the pelvis some 12/03/2010 showed a 1.4 cm stable lesion right lower liver thought to be a cavernous hemangioma. Followup CT scan on 08/05/2012 shows a 1.8 x 1.5 cm.  low-attenuation lesion in the periphery of the right liver the liver, also felt to be essentially stable and benign cavernous hemangioma. Nonobstructing  kidney stone on the right kidney noted. No sign of recurrent tumor in the peripancreatic area.  Medically he feels well. He is under evaluation by Bjorn Pippin for hematuria. Cystoscopy is planned. He otherwise has no GI complaints.  HPI  Past Medical History  Diagnosis Date  . DIAB W/O COMP TYPE II/UNS NOT STATED UNCNTRL 03/30/2009  . SHOULDER PAIN, LEFT 10/20/2008  . BURSITIS, LEFT HIP 05/20/2010  . ELEVATED BLOOD PRESSURE 03/30/2009  . Hyperlipidemia 10/11/2010  . Fissure, anal     occ bleeds still  . Chronic kidney disease     stones  . Cancer 2010    pancreatic cancer  . Arthritis   . Headache     "only when I was working; had headaches and migraines"  . Claustrophobia     "take Lorazepam if I have to fly"    Past Surgical History  Procedure Laterality Date  . Lipoma excision      "several; off back, arms"  . Biopsy pancreas  2010    benign  . Kidney stones  2010 & 2008  & 2005  . Vasectomy  1994  . Pancreatic tumor  2010  . Right rotator cuff      2011  . Left rotator cuff  02/26/2011  . Left shoulder bone spurs  2011  . Tonsillectomy  1967  . Hernia repair  2001    umbilical  . Laparoscopic incisional / umbilical / ventral hernia repair  07/08/11    V[facility]  . Back surgery  2000    "had 2 cages put in"  . Ventral hernia repair  07/08/2011    Procedure: LAPAROSCOPIC VENTRAL HERNIA;  Surgeon: Ernestene Mention, MD;  Location: [facility] OR;  Service: General;  Laterality: N/A;  Laparoscopic repair ventral hernia with mesh, Lysis of adhesions.  . Anal fissure repair  07/28/2011    Procedure: ANAL FISSURE REPAIR;  Surgeon: Ernestene Mention, MD;  Location: [facility] ORS;  Service: General;  Laterality: N/A;  Repair Anal Fissure/sphinterotomy and excision rectal polypl    Family History  Problem Relation Age of Onset  . Diabetes Brother   . Stroke Father   . Heart disease Father   . Colon cancer Maternal Grandfather   . Cancer Maternal Grandfather     colon    Social History History  Substance Use Topics  . Smoking status: Never Smoker   . Smokeless tobacco: Never Used  .  Alcohol Use: Yes     Comment: 07/08/11 "maybe 2-3 times a year"    Allergies  Allergen Reactions  . Merthiolate (Thimerosol)     Unknown reaction  . Morphine     REACTION: paronia    Current Outpatient Prescriptions  Medication Sig Dispense Refill  . aspirin 81 MG tablet Take 81 mg by mouth daily.      Marland Kitchen atorvastatin (LIPITOR) 20 MG tablet Take 1 tablet (20 mg total) by mouth every morning.  30 tablet  11  . celecoxib (CELEBREX) 200 MG capsule Take 1 capsule (200 mg total) by mouth 2 (two) times daily.  60 capsule  11  . cetirizine (ZYRTEC) 10 MG tablet Take 10 mg by mouth daily.        Marland Kitchen FIBER PO Take 1 tablet by mouth daily.       Marland Kitchen LORazepam (ATIVAN) 1 MG tablet Take 1 tablet (1 mg total) by mouth every 8 (eight) hours. Only takes when flying  30 tablet  0  . metFORMIN (GLUCOPHAGE) 500  MG tablet Take 1 tablet (500 mg total) by mouth 2 (two) times daily with a meal.  60 tablet  5  . Multiple Vitamins-Minerals (CENTRUM PO) Take by mouth daily.       Marland Kitchen olopatadine (PATANOL) 0.1 % ophthalmic solution Place 1 drop into both eyes 2 (two) times daily as needed.      . potassium citrate (UROCIT-K) 10 MEQ (1080 MG) SR tablet Take 1 tablet (10 mEq total) by mouth 2 (two) times daily.  120 tablet  6   No current facility-administered medications for this visit.    Review of Systems Review of Systems  Constitutional: Negative for fever, chills and unexpected weight change.  HENT: Negative for hearing loss, congestion, sore throat, trouble swallowing and voice change.   Eyes: Negative for visual disturbance.  Respiratory: Negative for cough and wheezing.   Cardiovascular: Negative for chest pain, palpitations and leg swelling.  Gastrointestinal: Negative for nausea, vomiting, abdominal pain, diarrhea, constipation, blood in stool, abdominal distention, anal bleeding and rectal pain.  Genitourinary: Positive for hematuria. Negative for difficulty urinating.  Musculoskeletal: Negative for arthralgias.  Skin: Negative for rash and wound.  Neurological: Negative for seizures, syncope, weakness and headaches.  Hematological: Negative for adenopathy. Does not bruise/bleed easily.  Psychiatric/Behavioral: Negative for confusion.    Blood pressure 120/70, pulse 60, temperature 97.8 F (36.6 C), temperature source Oral, resp. rate 12, height 6' 2.5" (1.892 m), weight 274 lb (124.286 kg).  Physical Exam Physical Exam  Constitutional: He is oriented to person, place, and time. He appears well-developed and well-nourished. No distress.  HENT:  Head: Normocephalic.  Nose: Nose normal.  Mouth/Throat: No oropharyngeal exudate.  Eyes: Conjunctivae and EOM are normal. Pupils are equal, round, and reactive to light. Right eye exhibits no discharge. Left eye exhibits no discharge. No scleral  icterus.  Neck: Normal range of motion. Neck supple. No JVD present. No tracheal deviation present. No thyromegaly present.  Cardiovascular: Normal rate, regular rhythm, normal heart sounds and intact distal pulses.   No murmur heard. Pulmonary/Chest: Effort normal and breath sounds normal. No stridor. No respiratory distress. He has no wheezes. He has no rales. He exhibits no tenderness.  Abdominal: Soft. Bowel sounds are normal. He exhibits no distension and no mass. There is no tenderness. There is no rebound and no guarding.  Examined supine and standing. Upper midline incision and trocar sites well-healed. Hernia repair intact. No organomegaly. No mass. No  tenderness.  Genitourinary:  No inguinal adenopathy.  Musculoskeletal: Normal range of motion. He exhibits no edema and no tenderness.  Lymphadenopathy:    He has no cervical adenopathy.  Neurological: He is alert and oriented to person, place, and time. He has normal reflexes. Coordination normal.  Skin: Skin is warm and dry. No rash noted. He is not diaphoretic. No erythema. No pallor.  Psychiatric: He has a normal mood and affect. His behavior is normal. Judgment and thought content normal.    Data Reviewed My old records. Recent CT scan  Assessment    4.9 cm well-differentiated neuroendocrine tumor of pancreas. No evidence of recurrence 4 years following partial pancreatectomy  Small liver nodule, right lobe of liver, lateral, stable. Low risk finding. Followup indicated because of history of pancreatic neoplasm  History ventral incisional hernia repair with mesh. Repair intact  Hematuria and kidney stones, currently under evaluation     Plan    I told him that followup CT in one year would be advisable because of his history of pancreatic neoplasm. That would take [place] out 5 years. He wanted to wait until January of 2015. We discussed that and I agreed with that.  We'll talk about the CT scan findings. He is comfortable  with observation only. He has declined referral to a hepatologist or hepatic surgeon. He we both think that biopsy is probably too invasive given the low probability that this is a malignancy  Continue urology workup with Dr. Bjorn Pippin  I will see him in January 2015 after his CT scan. If that remains stable but he probably does not need further CT scanning.        Angelia Mould. Derrell Lolling, M.D., [facility] General and Minimally invasive Surgery Breast and Colorectal Surgery Office:   [phone] Pager:   [phone]  10/07/2012, 11:02 AM

## 2013-01-16 ENCOUNTER — Other Ambulatory Visit: Payer: Self-pay | Admitting: Family Medicine

## 2013-04-10 ENCOUNTER — Other Ambulatory Visit: Payer: Self-pay | Admitting: Family Medicine

## 2013-04-11 ENCOUNTER — Other Ambulatory Visit: Payer: Self-pay

## 2013-04-11 MED ORDER — OLOPATADINE HCL 0.1 % OP SOLN: 1.0000 [drp] | Freq: Two times a day (BID) | OPHTHALMIC | Status: DC | PRN

## 2013-04-14 ENCOUNTER — Encounter: Payer: Self-pay | Admitting: Cardiovascular Disease

## 2013-04-14 ENCOUNTER — Ambulatory Visit (HOSPITAL_COMMUNITY): Payer: BC Managed Care – PPO | Attending: Cardiology

## 2013-04-14 ENCOUNTER — Ambulatory Visit (INDEPENDENT_AMBULATORY_CARE_PROVIDER_SITE_OTHER): Payer: BC Managed Care – PPO | Admitting: Cardiovascular Disease

## 2013-04-14 VITALS — BP 137/96 | HR 66 | Ht 74.5 in | Wt 267.1 lb

## 2013-04-14 DIAGNOSIS — R5381 Other malaise: Secondary | ICD-10-CM | POA: Insufficient documentation

## 2013-04-14 DIAGNOSIS — R5383 Other fatigue: Secondary | ICD-10-CM

## 2013-04-14 DIAGNOSIS — I517 Cardiomegaly: Secondary | ICD-10-CM

## 2013-04-14 DIAGNOSIS — E119 Type 2 diabetes mellitus without complications: Secondary | ICD-10-CM | POA: Insufficient documentation

## 2013-04-14 DIAGNOSIS — I10 Essential (primary) hypertension: Secondary | ICD-10-CM | POA: Insufficient documentation

## 2013-04-14 DIAGNOSIS — Z8249 Family history of ischemic heart disease and other diseases of the circulatory system: Secondary | ICD-10-CM | POA: Insufficient documentation

## 2013-04-14 DIAGNOSIS — E785 Hyperlipidemia, unspecified: Secondary | ICD-10-CM | POA: Insufficient documentation

## 2013-04-14 NOTE — Progress Notes (Signed)
Echocardiogram performed.  

## 2013-04-14 NOTE — Progress Notes (Signed)
History of Present Illness: 52 yo male with history of HTN, HLD, DM, benign pancreatic tumor, kidney stones here today to establish cardiology care. He has no prior cardiac conditions. He did have a stress echo in 2008 at [facility] with no ischemia but he was noted to have LVH. He describes fatigue. No chest pain or SOB. 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   Primary Care Physician:Bruce Burchette  Last Lipid Profile:Lipid Panel     Component Value Date/Time   CHOL 157 08/20/2012 0816   TRIG 177.0* 08/20/2012 0816   HDL 36.10* 08/20/2012 0816   CHOLHDL 4 08/20/2012 0816   VLDL 35.4 08/20/2012 0816   LDLCALC 86 08/20/2012 0816     Past Medical History  Diagnosis Date  . DIAB W/O COMP TYPE II/UNS NOT STATED UNCNTRL 03/30/2009  . SHOULDER PAIN, LEFT 10/20/2008  . BURSITIS, LEFT HIP 05/20/2010  . ELEVATED BLOOD PRESSURE 03/30/2009  . Hyperlipidemia 10/11/2010  . Fissure, anal     occ bleeds still  . Chronic kidney disease     stones  . Cancer 2010    pancreatic cancer  . Arthritis   . Headache(784.0)     "only when I was working; had headaches and migraines"  . Claustrophobia     "take Lorazepam if I have to fly"    Past Surgical History  Procedure Laterality Date  . Lipoma excision      "several; off back, arms"  . Biopsy pancreas  2010    benign  . Kidney stones  2010 & 2008 & 2005  . Vasectomy  1994  . Pancreatic tumor  2010  . Right rotator cuff      2011  . Left rotator cuff  02/26/2011  . Left shoulder bone spurs  2011  . Tonsillectomy  1967  . Hernia repair  2001    umbilical  . Laparoscopic incisional / umbilical / ventral hernia repair  07/08/11    VHR  . Back surgery  2000    "had 2 cages put in"  . Ventral hernia repair  07/08/2011    Procedure: LAPAROSCOPIC VENTRAL HERNIA;  Surgeon: Ernestene Mention, MD;  Location: [facility] OR;  Service: General;  Laterality: N/A;  Laparoscopic repair ventral hernia with mesh, Lysis of adhesions.  . Anal fissure repair   07/28/2011    Procedure: ANAL FISSURE REPAIR;  Surgeon: Ernestene Mention, MD;  Location: [facility] ORS;  Service: General;  Laterality: N/A;  Repair Anal Fissure/sphinterotomy and excision rectal polypl    Current Outpatient Prescriptions  Medication Sig Dispense Refill  . aspirin 81 MG tablet Take 81 mg by mouth daily.      Marland Kitchen atorvastatin (LIPITOR) 20 MG tablet Take 1 tablet (20 mg total) by mouth every morning.  30 tablet  11  . celecoxib (CELEBREX) 200 MG capsule Take 1 capsule (200 mg total) by mouth 2 (two) times daily.  60 capsule  11  . cetirizine (ZYRTEC) 10 MG tablet Take 10 mg by mouth daily.        Marland Kitchen FIBER PO Take 1 tablet by mouth daily.       Marland Kitchen LORazepam (ATIVAN) 1 MG tablet Take 1 tablet (1 mg total) by mouth every 8 (eight) hours. Only takes when flying  30 tablet  0  . metFORMIN (GLUCOPHAGE) 500 MG tablet Take one tablet by mouth twice daily with food  60 tablet  5  . Multiple Vitamins-Minerals (CENTRUM PO) Take by mouth daily.       Marland Kitchen  olopatadine (PATANOL) 0.1 % ophthalmic solution Place 1 drop into both eyes 2 (two) times daily as needed.  5 mL  5  . potassium citrate (UROCIT-K) 10 MEQ (1080 MG) SR tablet take 1 tablet by mouth twice a day  120 tablet  0   No current facility-administered medications for this visit.    Allergies  Allergen Reactions  . Merthiolate [Thimerosol]     Unknown reaction  . Morphine     REACTION: paronia    History   Social History  . Marital Status: Married    Spouse Name: N/A    Number of Children: 1  . Years of Education: N/A   Occupational History  . Retired-part time car salesman    Social History Main Topics  . Smoking status: Never Smoker   . Smokeless tobacco: Never Used  . Alcohol Use: No     Comment: 07/08/11 "maybe 2-3 times a year"  . Drug Use: No  . Sexual Activity: Yes   Other Topics Concern  . Not on file   Social History Narrative  . No narrative on file    Family History  Problem Relation Age of Onset  .  Diabetes Brother   . Stroke Father   . Heart disease Father   . Colon cancer Maternal Grandfather   . Cancer Maternal Grandfather     colon  . Heart attack Father 89    Review of Systems:  As stated in the HPI and otherwise negative.   BP 137/96  Pulse 66  Ht 6' 2.5" (1.892 m)  Wt 267 lb 1.9 oz (121.165 kg)  BMI 33.85 kg/m2  Physical Examination: General: Well developed, well nourished, NAD HEENT: OP clear, mucus membranes moist SKIN: warm, dry. No rashes. Neuro: No focal deficits Musculoskeletal: Muscle strength 5/5 all ext Psychiatric: Mood and affect normal Neck: No JVD, no carotid bruits, no thyromegaly, no lymphadenopathy. Lungs:Clear bilaterally, no wheezes, rhonci, crackles Cardiovascular: Regular rate and rhythm. No murmurs, gallops or rubs. Abdomen:Soft. Bowel sounds present. Non-tender.  Extremities: No lower extremity edema. Pulses are 2 + in the bilateral DP/PT.  EKG: sinus brady, rate 52 bpm.   Assessment and Plan:   1. Fatigue: He has recent fatigue. No chest pain but he does have risk factors for CAD including FH of CAD, HTN, HLD, DM and obesity. Will arrange exercise treadmill stress test to exclude ischemia and echocardiogram to assess LV size and function, exclude valvular issues.

## 2013-04-14 NOTE — Patient Instructions (Addendum)
Your physician wants you to follow-up in:  12 months. You will receive a reminder letter in the mail two months in advance. If you don't receive a letter, please call our office to schedule the follow-up appointment.  Your physician has requested that you have an echocardiogram. Echocardiography is a painless test that uses sound waves to create images of your heart. It provides your doctor with information about the size and shape of your heart and how well your heart's chambers and valves are working. This procedure takes approximately one hour. There are no restrictions for this procedure.   Your physician has requested that you have an exercise tolerance test. Can be done with NP or PA.  For further information please visit [email or web address]. Please also follow instruction sheet, as given.   

## 2013-04-26 ENCOUNTER — Other Ambulatory Visit: Payer: Self-pay | Admitting: Family Medicine

## 2013-04-29 ENCOUNTER — Other Ambulatory Visit: Payer: Self-pay | Admitting: Family Medicine

## 2013-05-06 ENCOUNTER — Ambulatory Visit (INDEPENDENT_AMBULATORY_CARE_PROVIDER_SITE_OTHER): Payer: BC Managed Care – PPO | Admitting: Family Medicine

## 2013-05-06 ENCOUNTER — Encounter: Payer: Self-pay | Admitting: Family Medicine

## 2013-05-06 VITALS — BP 126/72 | HR 65 | Temp 98.3°F | Wt 272.0 lb

## 2013-05-06 DIAGNOSIS — E119 Type 2 diabetes mellitus without complications: Secondary | ICD-10-CM

## 2013-05-06 LAB — HEMOGLOBIN A1C: Hgb A1c MFr Bld: 5.8 % (ref 4.6–6.5)

## 2013-05-06 MED ORDER — LORAZEPAM 1 MG PO TABS: 1.0000 mg | ORAL_TABLET | Freq: Three times a day (TID) | ORAL | Status: DC

## 2013-05-06 NOTE — Progress Notes (Signed)
Subjective:    Patient ID: [name], male    DOB: [birth year], 52 y.o.   MRN: [id number]  HPI Followup type 2 diabetes Fasting blood sugars recently run 117 -127 which is a slight increase for him. On metformin and no other medications. No symptoms of hyperglycemia. Exercises several days per week. A1c has been extremely well controlled most recently 5.7%.  Other medical problems include history of hyperlipidemia, kidney stones, and history of primary pancreatic neuroendocrine tumor  He takes lorazepam as needed situationally for things like flying and requesting refill  Past Medical History  Diagnosis Date  . DIAB W/O COMP TYPE II/UNS NOT STATED UNCNTRL 03/30/2009  . SHOULDER PAIN, LEFT 10/20/2008  . BURSITIS, LEFT HIP 05/20/2010  . ELEVATED BLOOD PRESSURE 03/30/2009  . Hyperlipidemia 10/11/2010  . Fissure, anal     occ bleeds still  . Chronic kidney disease     stones  . Cancer 2010    pancreatic cancer  . Arthritis   . Headache(784.0)     "only when I was working; had headaches and migraines"  . Claustrophobia     "take Lorazepam if I have to fly"   Past Surgical History  Procedure Laterality Date  . Lipoma excision      "several; off back, arms"  . Biopsy pancreas  2010    benign  . Kidney stones  2010 & 2008 & 2005  . Vasectomy  1994  . Pancreatic tumor  2010  . Right rotator cuff      2011  . Left rotator cuff  02/26/2011  . Left shoulder bone spurs  2011  . Tonsillectomy  1967  . Hernia repair  2001    umbilical  . Laparoscopic incisional / umbilical / ventral hernia repair  07/08/11    VHR  . Back surgery  2000    "had 2 cages put in"  . Ventral hernia repair  07/08/2011    Procedure: LAPAROSCOPIC VENTRAL HERNIA;  Surgeon: Ernestene Mention, MD;  Location: [facility] OR;  Service: General;  Laterality: N/A;  Laparoscopic repair ventral hernia with mesh, Lysis of adhesions.  . Anal fissure repair  07/28/2011    Procedure: ANAL FISSURE REPAIR;  Surgeon: Ernestene Mention, MD;  Location: [facility] OR[facility];  Service: General;  Laterality: N/A;  Repair Anal Fissure/sphinterotomy and excision rectal polypl    reports that he has never smoked. He has never used smokeless tobacco. He reports that he does not drink alcohol or use illicit drugs. family history includes Cancer in his maternal grandfather; Colon cancer in his maternal grandfather; Diabetes in his brother; Heart attack (age of onset: 22) in his father; Heart disease in his father; Stroke in his father. Allergies  Allergen Reactions  . Merthiolate [Thimerosol]     Unknown reaction  . Morphine     REACTION: paronia      Review of Systems  Constitutional: Negative for fatigue.  Eyes: Negative for visual disturbance.  Respiratory: Negative for cough, chest tightness and shortness of breath.   Cardiovascular: Negative for chest pain, palpitations and leg swelling.  Neurological: Negative for dizziness, syncope, weakness, light-headedness and headaches.       Objective:   Physical Exam  Constitutional: He appears well-developed and well-nourished.  Neck: Neck supple. No thyromegaly present.  Cardiovascular: Normal rate and regular rhythm.   Pulmonary/Chest: Effort normal and breath sounds normal. No respiratory distress. He has no wheezes. He has no rales.  Assessment & Plan:  Type 2 diabetes. History of excellent control. Recheck A1c. Refill lorazepam for as needed use for situational issues like flying We discussed weight management and importance in maintaining diabetes control over time.

## 2013-05-08 DIAGNOSIS — E669 Obesity, unspecified: Secondary | ICD-10-CM | POA: Insufficient documentation

## 2013-05-10 ENCOUNTER — Ambulatory Visit (INDEPENDENT_AMBULATORY_CARE_PROVIDER_SITE_OTHER): Payer: BC Managed Care – PPO | Admitting: Physician Assistant

## 2013-05-10 ENCOUNTER — Other Ambulatory Visit: Payer: Self-pay | Admitting: *Deleted

## 2013-05-10 DIAGNOSIS — R5381 Other malaise: Secondary | ICD-10-CM

## 2013-05-10 DIAGNOSIS — Z8249 Family history of ischemic heart disease and other diseases of the circulatory system: Secondary | ICD-10-CM

## 2013-05-10 DIAGNOSIS — R5383 Other fatigue: Secondary | ICD-10-CM

## 2013-05-10 DIAGNOSIS — E119 Type 2 diabetes mellitus without complications: Secondary | ICD-10-CM

## 2013-05-10 NOTE — Progress Notes (Signed)
Exercise Treadmill Test  Pre-Exercise Testing Evaluation Rhythm: sinus bradycardia  Rate: 57                 Test  Exercise Tolerance Test Ordering MD: Melene Muller, MD  Interpreting MD: Tereso Newcomer, PA-C  Unique Test No: 1   Treadmill:  1  Indication for ETT: Fatigue  Contraindication to ETT: No   Stress Modality: exercise - treadmill  Cardiac Imaging Performed: non   Protocol: standard Bruce - maximal  Max BP:  163/70  Max MPHR (bpm):  168 85% MPR (bpm):  143  MPHR obtained (bpm):  150 % MPHR obtained:  89  Reached 85% MPHR (min:sec):  11:21 Total Exercise Time (min-sec):  12:00  Workload in METS:  13.4 Borg Scale: 17  Reason ETT Terminated:  desired heart rate attained    ST Segment Analysis At Rest: normal ST segments - no evidence of significant ST depression With Exercise: no evidence of significant ST depression  Other Information Arrhythmia:  No Angina during ETT:  absent (0) Quality of ETT:  diagnostic  ETT Interpretation:  normal - no evidence of ischemia by ST analysis  Comments: Excellent exercise capacity. No chest pain. Normal BP response to exercise. No ST-T changes to suggest ischemia.   Recommendations: F/u with Dr. Verne Carrow as planned. Signed,  Tereso Newcomer, PA-C   05/10/2013 11:11 AM

## 2013-06-29 ENCOUNTER — Other Ambulatory Visit: Payer: Self-pay | Admitting: Family Medicine

## 2013-07-17 ENCOUNTER — Other Ambulatory Visit: Payer: Self-pay | Admitting: Family Medicine

## 2013-08-22 ENCOUNTER — Encounter (INDEPENDENT_AMBULATORY_CARE_PROVIDER_SITE_OTHER): Payer: Self-pay | Admitting: General Surgery

## 2013-08-22 ENCOUNTER — Telehealth (INDEPENDENT_AMBULATORY_CARE_PROVIDER_SITE_OTHER): Payer: Self-pay | Admitting: *Deleted

## 2013-08-22 ENCOUNTER — Encounter: Payer: Self-pay | Admitting: Family Medicine

## 2013-08-22 ENCOUNTER — Other Ambulatory Visit (INDEPENDENT_AMBULATORY_CARE_PROVIDER_SITE_OTHER): Payer: Self-pay

## 2013-08-22 DIAGNOSIS — M159 Polyosteoarthritis, unspecified: Secondary | ICD-10-CM | POA: Insufficient documentation

## 2013-08-22 DIAGNOSIS — K7689 Other specified diseases of liver: Secondary | ICD-10-CM

## 2013-08-22 DIAGNOSIS — D3A8 Other benign neuroendocrine tumors: Secondary | ICD-10-CM

## 2013-08-22 NOTE — Telephone Encounter (Signed)
I called pt to inform him of his CT scan appt and he became angry stating he has asked our office not to schedule him for any appts and he does not want the CT scan done at this time.  I called GI and cancelled appt for CT.  I offered to give pt their phone number to call and schedule when he is ready but he states he has their number.

## 2013-08-23 ENCOUNTER — Encounter (INDEPENDENT_AMBULATORY_CARE_PROVIDER_SITE_OTHER): Payer: Self-pay | Admitting: General Surgery

## 2013-08-25 ENCOUNTER — Other Ambulatory Visit: Payer: BC Managed Care – PPO

## 2013-09-15 ENCOUNTER — Other Ambulatory Visit: Payer: Self-pay | Admitting: Family Medicine

## 2013-09-19 ENCOUNTER — Other Ambulatory Visit: Payer: Self-pay | Admitting: Family Medicine

## 2013-09-23 ENCOUNTER — Ambulatory Visit (INDEPENDENT_AMBULATORY_CARE_PROVIDER_SITE_OTHER): Payer: BC Managed Care – PPO | Admitting: General Surgery

## 2013-11-10 ENCOUNTER — Other Ambulatory Visit: Payer: Self-pay

## 2013-12-12 ENCOUNTER — Other Ambulatory Visit: Payer: Self-pay | Admitting: Family Medicine

## 2014-02-22 ENCOUNTER — Other Ambulatory Visit (INDEPENDENT_AMBULATORY_CARE_PROVIDER_SITE_OTHER): Payer: BC Managed Care – PPO

## 2014-02-22 DIAGNOSIS — Z0000 Encounter for general adult medical examination without abnormal findings: Secondary | ICD-10-CM

## 2014-02-22 DIAGNOSIS — E785 Hyperlipidemia, unspecified: Secondary | ICD-10-CM

## 2014-02-22 LAB — BASIC METABOLIC PANEL
BUN: 15 mg/dL (ref 6–23)
CALCIUM: 9.2 mg/dL (ref 8.4–10.5)
CO2: 27 mEq/L (ref 19–32)
Chloride: 107 mEq/L (ref 96–112)
Creatinine, Ser: 0.8 mg/dL (ref 0.4–1.5)
GFR: 101.53 mL/min (ref >60.00)
Glucose, Bld: 110 mg/dL — ABNORMAL HIGH (ref 70–99)
Potassium: 4.4 mEq/L (ref 3.5–5.1)
Sodium: 143 mEq/L (ref 135–145)

## 2014-02-22 LAB — LIPID PANEL
CHOLESTEROL: 160 mg/dL (ref 0–200)
HDL: 37.6 mg/dL — AB (ref >39.00)
NONHDL: 122.4
TRIGLYCERIDES: 244 mg/dL — AB (ref 0.0–149.0)
Total CHOL/HDL Ratio: 4
VLDL: 48.8 mg/dL — ABNORMAL HIGH (ref 0.0–40.0)

## 2014-02-22 LAB — HEPATIC FUNCTION PANEL
ALT: 44 U/L (ref 0–53)
AST: 72 U/L — ABNORMAL HIGH (ref 0–37)
Albumin: 4.3 g/dL (ref 3.5–5.2)
Alkaline Phosphatase: 52 U/L (ref 39–117)
BILIRUBIN DIRECT: 0.2 mg/dL (ref 0.0–0.3)
BILIRUBIN TOTAL: 1.4 mg/dL — AB (ref 0.2–1.2)
Total Protein: 7 g/dL (ref 6.0–8.3)

## 2014-02-22 LAB — CBC WITH DIFFERENTIAL/PLATELET
BASOS PCT: 0.4 % (ref 0.0–3.0)
Basophils Absolute: 0 10*3/uL (ref 0.0–0.1)
EOS ABS: 0.2 10*3/uL (ref 0.0–0.7)
Eosinophils Relative: 4.2 % (ref 0.0–5.0)
HCT: 43.5 % (ref 39.0–52.0)
Hemoglobin: 15 g/dL (ref 13.0–17.0)
Lymphocytes Relative: 33.7 % (ref 12.0–46.0)
Lymphs Abs: 1.7 10*3/uL (ref 0.7–4.0)
MCHC: 34.6 g/dL (ref 30.0–36.0)
MCV: 90.1 fl (ref 78.0–100.0)
Monocytes Absolute: 0.3 10*3/uL (ref 0.1–1.0)
Monocytes Relative: 6.2 % (ref 3.0–12.0)
NEUTROS PCT: 55.5 % (ref 43.0–77.0)
Neutro Abs: 2.8 10*3/uL (ref 1.4–7.7)
PLATELETS: 242 10*3/uL (ref 150.0–400.0)
RBC: 4.83 Mil/uL (ref 4.22–5.81)
RDW: 12.6 % (ref 11.5–15.5)
WBC: 5.1 10*3/uL (ref 4.0–10.5)

## 2014-02-22 LAB — HEMOGLOBIN A1C: HEMOGLOBIN A1C: 6.1 % (ref 4.6–6.5)

## 2014-02-22 LAB — POCT URINALYSIS DIPSTICK
GLUCOSE UA: NEGATIVE
Nitrite, UA: NEGATIVE
PH UA: 5.5
Spec Grav, UA: 1.02
Urobilinogen, UA: 0.2

## 2014-02-22 LAB — MICROALBUMIN / CREATININE URINE RATIO
CREATININE, U: 402.4 mg/dL
Microalb Creat Ratio: 1.5 mg/g (ref 0.0–30.0)
Microalb, Ur: 6.1 mg/dL — ABNORMAL HIGH (ref 0.0–1.9)

## 2014-02-22 LAB — PSA: PSA: 0.79 ng/mL (ref 0.10–4.00)

## 2014-02-22 LAB — LDL CHOLESTEROL, DIRECT: LDL DIRECT: 100.8 mg/dL

## 2014-02-22 LAB — TSH: TSH: 3.46 u[IU]/mL (ref 0.35–4.50)

## 2014-02-27 ENCOUNTER — Ambulatory Visit (INDEPENDENT_AMBULATORY_CARE_PROVIDER_SITE_OTHER): Payer: BC Managed Care – PPO | Admitting: Family Medicine

## 2014-02-27 ENCOUNTER — Encounter: Payer: Self-pay | Admitting: Family Medicine

## 2014-02-27 VITALS — BP 130/80 | HR 81 | Temp 98.5°F | Ht 74.5 in | Wt 288.0 lb

## 2014-02-27 DIAGNOSIS — R7402 Elevation of levels of lactic acid dehydrogenase (LDH): Secondary | ICD-10-CM

## 2014-02-27 DIAGNOSIS — R319 Hematuria, unspecified: Secondary | ICD-10-CM

## 2014-02-27 DIAGNOSIS — R6882 Decreased libido: Secondary | ICD-10-CM

## 2014-02-27 DIAGNOSIS — Z0000 Encounter for general adult medical examination without abnormal findings: Secondary | ICD-10-CM

## 2014-02-27 DIAGNOSIS — R7401 Elevation of levels of liver transaminase levels: Secondary | ICD-10-CM

## 2014-02-27 DIAGNOSIS — R740 Nonspecific elevation of levels of transaminase and lactic acid dehydrogenase [LDH]: Secondary | ICD-10-CM

## 2014-02-27 LAB — POCT URINALYSIS DIPSTICK
Glucose, UA: NEGATIVE
LEUKOCYTES UA: NEGATIVE
NITRITE UA: NEGATIVE
PH UA: 5.5
Spec Grav, UA: >=1.03
UROBILINOGEN UA: 0.2

## 2014-02-27 MED ORDER — CELEBREX 200 MG PO CAPS: 200.0000 mg | ORAL_CAPSULE | Freq: Two times a day (BID) | ORAL | Status: DC

## 2014-02-27 MED ORDER — METFORMIN HCL 500 MG PO TABS: 500.0000 mg | ORAL_TABLET | Freq: Two times a day (BID) | ORAL | Status: DC

## 2014-02-27 MED ORDER — LORAZEPAM 1 MG PO TABS: 1.0000 mg | ORAL_TABLET | Freq: Three times a day (TID) | ORAL | Status: DC

## 2014-02-27 MED ORDER — CYCLOBENZAPRINE HCL 10 MG PO TABS: 10.0000 mg | ORAL_TABLET | Freq: Three times a day (TID) | ORAL | Status: DC | PRN

## 2014-02-27 NOTE — Progress Notes (Signed)
Pre visit review using our clinic review tool, if applicable. No additional management support is needed unless otherwise documented below in the visit note. 

## 2014-02-27 NOTE — Patient Instructions (Signed)
Lose some weight and let's plan to repeat liver panel in 3 months.

## 2014-02-27 NOTE — Progress Notes (Signed)
Subjective:    Patient ID: [name], male    DOB: 12/17/1960, 53 y.o.   MRN: [id number]  HPI Patient here for complete physical. He has chronic problems including obesity, type 2 diabetes, dyslipidemia, recurrent kidney stones, osteoarthritis. He has unfortunately gained some weight since last year. Just recently started back exercise program. Colonoscopy is up-to-date. Tetanus up-to-date. He is requesting several medications. He has intermittent chronic low back pain and requesting refill Flexeril. Upcoming flight and requesting refill lorazepam. He needs refills of metformin and Celebrex. His osteoarthritis pains are stable.  Past history of multiple kidney stones. He has some chronic microscopic hematuria and has had full urologic workup for hematuria in the past recently.  Nonsmoker. Only rare alcohol use  Past Medical History  Diagnosis Date  . DIAB W/O COMP TYPE II/UNS NOT STATED UNCNTRL 03/30/2009  . SHOULDER PAIN, LEFT 10/20/2008  . BURSITIS, LEFT HIP 05/20/2010  . ELEVATED BLOOD PRESSURE 03/30/2009  . Hyperlipidemia 10/11/2010  . Fissure, anal     occ bleeds still  . Chronic kidney disease     stones  . Cancer 2010    pancreatic cancer  . Arthritis   . Headache(784.0)     "only when I was working; had headaches and migraines"  . Claustrophobia     "take Lorazepam if I have to fly"   Past Surgical History  Procedure Laterality Date  . Lipoma excision      "several; off back, arms"  . Biopsy pancreas  2010    benign  . Kidney stones  2010 & 2008 & 2005  . Vasectomy  1994  . Pancreatic tumor  2010  . Right rotator cuff      2011  . Left rotator cuff  02/26/2011  . Left shoulder bone spurs  2011  . Tonsillectomy  1967  . Hernia repair  2683    umbilical  . Laparoscopic incisional / umbilical / ventral hernia repair  07/08/11    [facility]  . Back surgery  2000    "had 2 cages put in"  . Ventral hernia repair  07/08/2011    Procedure: LAPAROSCOPIC VENTRAL HERNIA;   Surgeon: Adin Hector, MD;  Location: [place];  Service: General;  Laterality: N/A;  Laparoscopic repair ventral hernia with mesh, Lysis of adhesions.  . Anal fissure repair  07/28/2011    Procedure: ANAL FISSURE REPAIR;  Surgeon: Adin Hector, MD;  Location: [facility];  Service: General;  Laterality: N/A;  Repair Anal Fissure/sphinterotomy and excision rectal polypl    reports that he has never smoked. He has never used smokeless tobacco. He reports that he does not drink alcohol or use illicit drugs. family history includes Cancer in his maternal grandfather; Colon cancer in his maternal grandfather; Diabetes in his brother; Heart attack (age of onset: 41) in his father; Heart disease in his father; Stroke in his father. Allergies  Allergen Reactions  . Merthiolate [Thimerosol]     Unknown reaction  . Morphine     REACTION: paronia      Review of Systems  Constitutional: Negative for fever, activity change, appetite change and fatigue.  HENT: Negative for congestion, ear pain and trouble swallowing.   Eyes: Negative for pain and visual disturbance.  Respiratory: Negative for cough, shortness of breath and wheezing.   Cardiovascular: Negative for chest pain and palpitations.  Gastrointestinal: Negative for nausea, vomiting, abdominal pain, diarrhea, constipation, blood in stool, abdominal distention and rectal pain.  Endocrine: Negative for  polydipsia and polyuria.  Genitourinary: Negative for dysuria, hematuria and testicular pain.  Musculoskeletal: Positive for arthralgias. Negative for joint swelling.  Skin: Negative for rash.  Neurological: Negative for dizziness, syncope and headaches.  Hematological: Negative for adenopathy.  Psychiatric/Behavioral: Negative for confusion and dysphoric mood.       Objective:   Physical Exam  Constitutional: He is oriented to person, place, and time. He appears well-developed and well-nourished. No distress.  HENT:  Head: Normocephalic  and atraumatic.  Right Ear: External ear normal.  Left Ear: External ear normal.  Mouth/Throat: Oropharynx is clear and moist.  Eyes: Conjunctivae and EOM are normal. Pupils are equal, round, and reactive to light.  Neck: Normal range of motion. Neck supple. No thyromegaly present.  Cardiovascular: Normal rate, regular rhythm and normal heart sounds.   No murmur heard. Pulmonary/Chest: No respiratory distress. He has no wheezes. He has no rales.  Abdominal: Soft. Bowel sounds are normal. He exhibits no distension and no mass. There is no tenderness. There is no rebound and no guarding.  Musculoskeletal: He exhibits no edema.  Lymphadenopathy:    He has no cervical adenopathy.  Neurological: He is alert and oriented to person, place, and time. He displays normal reflexes. No cranial nerve deficit.  Skin: No rash noted.  Psychiatric: He has a normal mood and affect.          Assessment & Plan:  Complete physical. We discussed the importance of weight loss at some length. He recently started back regular exercise program. He had mildly elevated AST but no history of alcohol consumption. Lose weight and recheck in 3 months.  Refilled limited lorazepam to use as needed for flying situations. Plan routine followup regarding diabetes in 6 months  Low libido. We discussed possible contributed factors. When he returns for labs in 3 months we'll get early-morning testosterone level

## 2014-04-11 ENCOUNTER — Other Ambulatory Visit: Payer: Self-pay

## 2014-04-11 MED ORDER — METFORMIN HCL 500 MG PO TABS: 500.0000 mg | ORAL_TABLET | Freq: Two times a day (BID) | ORAL | Status: DC

## 2014-04-27 LAB — HM DIABETES EYE EXAM

## 2014-05-02 ENCOUNTER — Encounter: Payer: Self-pay | Admitting: Family Medicine

## 2014-05-10 ENCOUNTER — Other Ambulatory Visit: Payer: Self-pay | Admitting: Family Medicine

## 2014-05-12 ENCOUNTER — Telehealth: Payer: Self-pay | Admitting: Family Medicine

## 2014-05-12 MED ORDER — POTASSIUM CITRATE ER 10 MEQ (1080 MG) PO TBCR: EXTENDED_RELEASE_TABLET | ORAL | Status: DC

## 2014-05-12 NOTE — Telephone Encounter (Signed)
Pt rx that was Called in was potassium chloride (K-DUR) 10 MEQ tablet But that is not what pt takes .  This is not even in the right med category for pt according to the pharm Pt request is supposed to be potassium citrate (UROCIT-K) 10 MEQ (1080 MG) SR tablet,  #120 [facility]/ [address]

## 2014-05-12 NOTE — Telephone Encounter (Signed)
Rx sent to pharmacy   

## 2014-07-12 ENCOUNTER — Other Ambulatory Visit: Payer: Self-pay | Admitting: Family Medicine

## 2014-08-11 ENCOUNTER — Other Ambulatory Visit: Payer: Self-pay | Admitting: Family Medicine

## 2014-09-06 ENCOUNTER — Other Ambulatory Visit: Payer: Self-pay | Admitting: Family Medicine

## 2014-12-10 ENCOUNTER — Other Ambulatory Visit: Payer: Self-pay | Admitting: Family Medicine

## 2015-01-31 ENCOUNTER — Telehealth: Payer: Self-pay | Admitting: Family Medicine

## 2015-01-31 NOTE — Telephone Encounter (Signed)
Patient has a lab appointment 03/06/15 before his physical appt and would like to know what blood work you are drawing, he requested A1C and testosterone cholesterol panel. Please advise

## 2015-02-06 NOTE — Telephone Encounter (Signed)
Check CBC, TSH, CMP, A1C, Lipids, testosterone level.

## 2015-02-07 ENCOUNTER — Other Ambulatory Visit: Payer: Self-pay | Admitting: Family Medicine

## 2015-02-07 DIAGNOSIS — Z0000 Encounter for general adult medical examination without abnormal findings: Secondary | ICD-10-CM

## 2015-02-07 NOTE — Telephone Encounter (Signed)
Labs are ordered 

## 2015-03-06 ENCOUNTER — Other Ambulatory Visit (INDEPENDENT_AMBULATORY_CARE_PROVIDER_SITE_OTHER): Payer: BLUE CROSS/BLUE SHIELD

## 2015-03-06 DIAGNOSIS — Z0000 Encounter for general adult medical examination without abnormal findings: Secondary | ICD-10-CM

## 2015-03-06 DIAGNOSIS — E785 Hyperlipidemia, unspecified: Secondary | ICD-10-CM

## 2015-03-06 LAB — COMPREHENSIVE METABOLIC PANEL
ALK PHOS: 54 U/L (ref 39–117)
ALT: 42 U/L (ref 0–53)
AST: 55 U/L — ABNORMAL HIGH (ref 0–37)
Albumin: 4.4 g/dL (ref 3.5–5.2)
BILIRUBIN TOTAL: 0.8 mg/dL (ref 0.2–1.2)
BUN: 15 mg/dL (ref 6–23)
CHLORIDE: 107 meq/L (ref 96–112)
CO2: 29 mEq/L (ref 19–32)
Calcium: 9.4 mg/dL (ref 8.4–10.5)
Creatinine, Ser: 0.7 mg/dL (ref 0.40–1.50)
GFR: 124.82 mL/min (ref >60.00)
GLUCOSE: 127 mg/dL — AB (ref 70–99)
POTASSIUM: 4.6 meq/L (ref 3.5–5.1)
SODIUM: 143 meq/L (ref 135–145)
Total Protein: 7.3 g/dL (ref 6.0–8.3)

## 2015-03-06 LAB — LDL CHOLESTEROL, DIRECT: LDL DIRECT: 82 mg/dL

## 2015-03-06 LAB — HEMOGLOBIN A1C: Hgb A1c MFr Bld: 6.2 % (ref 4.6–6.5)

## 2015-03-06 LAB — LIPID PANEL
Cholesterol: 150 mg/dL (ref 0–200)
HDL: 36.7 mg/dL — ABNORMAL LOW (ref >39.00)
NonHDL: 113.71
TRIGLYCERIDES: 247 mg/dL — AB (ref 0.0–149.0)
Total CHOL/HDL Ratio: 4
VLDL: 49.4 mg/dL — ABNORMAL HIGH (ref 0.0–40.0)

## 2015-03-06 LAB — CBC WITH DIFFERENTIAL/PLATELET
Basophils Absolute: 0 10*3/uL (ref 0.0–0.1)
Basophils Relative: 0.3 % (ref 0.0–3.0)
Eosinophils Absolute: 0.2 10*3/uL (ref 0.0–0.7)
Eosinophils Relative: 4.5 % (ref 0.0–5.0)
HCT: 46.3 % (ref 39.0–52.0)
Hemoglobin: 15.3 g/dL (ref 13.0–17.0)
Lymphocytes Relative: 34.2 % (ref 12.0–46.0)
Lymphs Abs: 1.7 10*3/uL (ref 0.7–4.0)
MCHC: 33 g/dL (ref 30.0–36.0)
MCV: 90.1 fl (ref 78.0–100.0)
Monocytes Absolute: 0.4 10*3/uL (ref 0.1–1.0)
Monocytes Relative: 7.6 % (ref 3.0–12.0)
Neutro Abs: 2.7 10*3/uL (ref 1.4–7.7)
Neutrophils Relative %: 53.4 % (ref 43.0–77.0)
Platelets: 242 10*3/uL (ref 150.0–400.0)
RBC: 5.14 Mil/uL (ref 4.22–5.81)
RDW: 12.4 % (ref 11.5–15.5)
WBC: 5 10*3/uL (ref 4.0–10.5)

## 2015-03-06 LAB — TESTOSTERONE: TESTOSTERONE: 167.29 ng/dL — AB (ref 300.00–890.00)

## 2015-03-06 LAB — TSH: TSH: 2.28 u[IU]/mL (ref 0.35–4.50)

## 2015-03-13 ENCOUNTER — Encounter: Payer: Self-pay | Admitting: Family Medicine

## 2015-03-13 ENCOUNTER — Ambulatory Visit (INDEPENDENT_AMBULATORY_CARE_PROVIDER_SITE_OTHER): Payer: BLUE CROSS/BLUE SHIELD | Admitting: Family Medicine

## 2015-03-13 VITALS — BP 130/80 | HR 70 | Temp 98.4°F | Ht 74.5 in | Wt 293.0 lb

## 2015-03-13 DIAGNOSIS — E291 Testicular hypofunction: Secondary | ICD-10-CM

## 2015-03-13 DIAGNOSIS — R7989 Other specified abnormal findings of blood chemistry: Secondary | ICD-10-CM | POA: Insufficient documentation

## 2015-03-13 DIAGNOSIS — Z0000 Encounter for general adult medical examination without abnormal findings: Secondary | ICD-10-CM

## 2015-03-13 LAB — HM DIABETES EYE EXAM

## 2015-03-13 MED ORDER — CELEBREX 200 MG PO CAPS: 200.0000 mg | ORAL_CAPSULE | Freq: Two times a day (BID) | ORAL | Status: DC

## 2015-03-13 MED ORDER — METFORMIN HCL 500 MG PO TABS: ORAL_TABLET | ORAL | Status: DC

## 2015-03-13 MED ORDER — CYCLOBENZAPRINE HCL 10 MG PO TABS: 10.0000 mg | ORAL_TABLET | Freq: Three times a day (TID) | ORAL | Status: DC | PRN

## 2015-03-13 MED ORDER — LORAZEPAM 1 MG PO TABS
1.0000 mg | ORAL_TABLET | Freq: Three times a day (TID) | ORAL | Status: DC
Start: 2015-03-13 — End: 2015-09-19

## 2015-03-13 MED ORDER — POTASSIUM CITRATE ER 10 MEQ (1080 MG) PO TBCR: EXTENDED_RELEASE_TABLET | ORAL | Status: DC

## 2015-03-13 MED ORDER — ATORVASTATIN CALCIUM 20 MG PO TABS: 20.0000 mg | ORAL_TABLET | Freq: Every day | ORAL | Status: DC

## 2015-03-13 NOTE — Progress Notes (Signed)
Subjective:    Patient ID: [name], male    DOB: August 04, 1961, 54 y.o.   MRN: [id number]  HPI Patient for complete physical. He has history of obesity, osteoarthritis, hyperlipidemia, type 2 diabetes. He is requesting shingles vaccine. Other immunizations are up-to-date. Colonoscopy up-to-date.  Patient nonsmoker. No regular alcohol use. Gets exercise frequently through yard work and other activities around the house. He has complained of some low libido and decreased energy recently. He requested total testosterone level screen. Never screened previously.  Past Medical History  Diagnosis Date  . DIAB W/O COMP TYPE II/UNS NOT STATED UNCNTRL 03/30/2009  . SHOULDER PAIN, LEFT 10/20/2008  . BURSITIS, LEFT HIP 05/20/2010  . ELEVATED BLOOD PRESSURE 03/30/2009  . Hyperlipidemia 10/11/2010  . Fissure, anal     occ bleeds still  . Chronic kidney disease     stones  . Cancer 2010    pancreatic cancer  . Arthritis   . Headache(784.0)     "only when I was working; had headaches and migraines"  . Claustrophobia     "take Lorazepam if I have to fly"   Past Surgical History  Procedure Laterality Date  . Lipoma excision      "several; off back, arms"  . Biopsy pancreas  2010    benign  . Kidney stones  2010 & 2008 & 2005  . Vasectomy  1994  . Pancreatic tumor  2010  . Right rotator cuff      2011  . Left rotator cuff  02/26/2011  . Left shoulder bone spurs  2011  . Tonsillectomy  1967  . Hernia repair  1017    umbilical  . Laparoscopic incisional / umbilical / ventral hernia repair  07/08/11    [facility]  . Back surgery  2000    "had 2 cages put in"  . Ventral hernia repair  07/08/2011    Procedure: LAPAROSCOPIC VENTRAL HERNIA;  Surgeon: Adin Hector, MD;  Location: [place] [facility];  Service: General;  Laterality: N/A;  Laparoscopic repair ventral hernia with mesh, Lysis of adhesions.  . Anal fissure repair  07/28/2011    Procedure: ANAL FISSURE REPAIR;  Surgeon: Adin Hector, MD;   Location: [facility];  Service: General;  Laterality: N/A;  Repair Anal Fissure/sphinterotomy and excision rectal polypl    reports that he has never smoked. He has never used smokeless tobacco. He reports that he does not drink alcohol or use illicit drugs. family history includes Cancer in his maternal grandfather; Colon cancer in his maternal grandfather; Diabetes in his brother; Heart attack (age of onset: 16) in his father; Heart disease in his father; Stroke in his father. Allergies  Allergen Reactions  . Merthiolate [Thimerosol]     Unknown reaction  . Morphine     REACTION: paronia        Review of Systems  Constitutional: Negative for fever, activity change, appetite change and fatigue.  HENT: Negative for congestion, ear pain and trouble swallowing.   Eyes: Negative for pain and visual disturbance.  Respiratory: Negative for cough, shortness of breath and wheezing.   Cardiovascular: Negative for chest pain, palpitations and leg swelling.  Gastrointestinal: Negative for nausea, vomiting, abdominal pain, diarrhea, constipation, blood in stool, abdominal distention and rectal pain.  Genitourinary: Negative for dysuria, hematuria, flank pain and testicular pain.  Musculoskeletal: Positive for back pain. Negative for joint swelling and arthralgias.  Skin: Negative for rash.  Neurological: Negative for dizziness, syncope, weakness, numbness and headaches.  Hematological:  Negative for adenopathy.  Psychiatric/Behavioral: Negative for confusion and dysphoric mood.       Objective:   Physical Exam  Constitutional: He is oriented to person, place, and time. He appears well-developed and well-nourished. No distress.  HENT:  Head: Normocephalic and atraumatic.  Right Ear: External ear normal.  Left Ear: External ear normal.  Mouth/Throat: Oropharynx is clear and moist.  Eyes: Conjunctivae and EOM are normal. Pupils are equal, round, and reactive to light.  Neck: Normal range of  motion. Neck supple. No thyromegaly present.  Cardiovascular: Normal rate, regular rhythm and normal heart sounds.   No murmur heard. Pulmonary/Chest: No respiratory distress. He has no wheezes. He has no rales.  Abdominal: Soft. Bowel sounds are normal. He exhibits no distension and no mass. There is no tenderness. There is no rebound and no guarding.  Genitourinary: Rectum normal and prostate normal.  Musculoskeletal: He exhibits no edema.  Lymphadenopathy:    He has no cervical adenopathy.  Neurological: He is alert and oriented to person, place, and time. He displays normal reflexes. No cranial nerve deficit.  Skin: No rash noted.  Psychiatric: He has a normal mood and affect.          Assessment & Plan:  Complete physical. Patient given prescription for shingles vaccine which he plans to get through local pharmacy if this is covered by insurance. Labs reviewed. He has low total testosterone and would recommend repeating this once more in one month and will also check PSA at that time. He is advised to lose some weight.

## 2015-03-13 NOTE — Progress Notes (Signed)
Pre visit review using our clinic review tool, if applicable. No additional management support is needed unless otherwise documented below in the visit note. 

## 2015-03-26 ENCOUNTER — Encounter: Payer: Self-pay | Admitting: Family Medicine

## 2015-03-29 ENCOUNTER — Encounter: Payer: Self-pay | Admitting: Family Medicine

## 2015-04-18 ENCOUNTER — Other Ambulatory Visit (INDEPENDENT_AMBULATORY_CARE_PROVIDER_SITE_OTHER): Payer: BLUE CROSS/BLUE SHIELD

## 2015-04-18 DIAGNOSIS — E291 Testicular hypofunction: Secondary | ICD-10-CM

## 2015-04-18 DIAGNOSIS — R7989 Other specified abnormal findings of blood chemistry: Secondary | ICD-10-CM

## 2015-04-18 LAB — TESTOSTERONE: Testosterone: 211.25 ng/dL — ABNORMAL LOW (ref 300.00–890.00)

## 2015-04-18 LAB — PSA: PSA: 0.61 ng/mL (ref 0.10–4.00)

## 2015-04-19 ENCOUNTER — Telehealth: Payer: Self-pay

## 2015-04-19 NOTE — Telephone Encounter (Signed)
Pt states that at the office visit it was a testosterone injection that yall talked about? Would it be okay to start with the injection or pump spray.

## 2015-04-19 NOTE — Telephone Encounter (Signed)
We could do either but if his insurance covers, I would suggest Androgel pump spray 1.62% one pump spray per arm once daily.

## 2015-04-20 NOTE — Telephone Encounter (Signed)
Pt is going to check with insurance to see if they cover the testosterone injection.

## 2015-04-24 ENCOUNTER — Encounter: Payer: Self-pay | Admitting: Family Medicine

## 2015-05-04 ENCOUNTER — Encounter: Payer: Self-pay | Admitting: Family Medicine

## 2015-05-04 NOTE — Telephone Encounter (Signed)
Please advise 

## 2015-05-07 ENCOUNTER — Encounter: Payer: Self-pay | Admitting: Family Medicine

## 2015-05-07 ENCOUNTER — Other Ambulatory Visit: Payer: Self-pay | Admitting: Family Medicine

## 2015-05-07 DIAGNOSIS — E349 Endocrine disorder, unspecified: Secondary | ICD-10-CM

## 2015-05-07 MED ORDER — TESTOSTERONE CYPIONATE 200 MG/ML IM SOLN: 200.0000 mg | INTRAMUSCULAR | Status: DC

## 2015-05-07 NOTE — Telephone Encounter (Signed)
Rx was sent to the pharmacy and i reply to the pt by mychart.

## 2015-05-10 ENCOUNTER — Encounter: Payer: Self-pay | Admitting: Family Medicine

## 2015-05-10 MED ORDER — TESTOSTERONE CYPIONATE 200 MG/ML IM SOLN: 200.0000 mg | Freq: Once | INTRAMUSCULAR | Status: DC

## 2015-05-10 MED ORDER — "SYRINGE/NEEDLE (DISP) 22G X 1-1/2"" 10 ML MISC": Status: DC

## 2015-06-22 ENCOUNTER — Encounter: Payer: Self-pay | Admitting: Family Medicine

## 2015-06-22 ENCOUNTER — Other Ambulatory Visit (INDEPENDENT_AMBULATORY_CARE_PROVIDER_SITE_OTHER): Payer: BLUE CROSS/BLUE SHIELD

## 2015-06-22 DIAGNOSIS — E349 Endocrine disorder, unspecified: Secondary | ICD-10-CM

## 2015-06-22 DIAGNOSIS — E291 Testicular hypofunction: Secondary | ICD-10-CM

## 2015-06-22 LAB — TESTOSTERONE: TESTOSTERONE: 325.24 ng/dL (ref 300.00–890.00)

## 2015-06-27 ENCOUNTER — Other Ambulatory Visit: Payer: Self-pay | Admitting: Family Medicine

## 2015-06-27 MED ORDER — TESTOSTERONE CYPIONATE 200 MG/ML IM SOLN: 200.0000 mg | Freq: Once | INTRAMUSCULAR | Status: DC

## 2015-07-02 ENCOUNTER — Other Ambulatory Visit: Payer: Self-pay | Admitting: Family Medicine

## 2015-07-02 MED ORDER — "SYRINGE/NEEDLE (DISP) 22G X 1-1/2"" 10 ML MISC": Status: DC

## 2015-08-20 ENCOUNTER — Other Ambulatory Visit: Payer: Self-pay | Admitting: Family Medicine

## 2015-09-19 ENCOUNTER — Other Ambulatory Visit: Payer: Self-pay | Admitting: Family Medicine

## 2015-11-15 ENCOUNTER — Other Ambulatory Visit: Payer: Self-pay | Admitting: Family Medicine

## 2015-12-18 ENCOUNTER — Other Ambulatory Visit: Payer: Self-pay | Admitting: *Deleted

## 2015-12-18 NOTE — Telephone Encounter (Signed)
Received fax request from [facility] for refill on testosterone cypionate (DEPOTESTOSTERONE CYPIONATE) 200 MG/ML injection

## 2015-12-19 MED ORDER — TESTOSTERONE CYPIONATE 200 MG/ML IM SOLN: 200.0000 mg | Freq: Once | INTRAMUSCULAR | Status: DC

## 2015-12-20 ENCOUNTER — Encounter: Payer: Self-pay | Admitting: Family Medicine

## 2015-12-31 ENCOUNTER — Other Ambulatory Visit: Payer: Self-pay | Admitting: Family Medicine

## 2016-01-10 ENCOUNTER — Encounter: Payer: Self-pay | Admitting: Family Medicine

## 2016-01-14 ENCOUNTER — Telehealth: Payer: Self-pay | Admitting: Gastroenterology

## 2016-01-14 NOTE — Telephone Encounter (Signed)
Pt has not been seen since 2012, he will need an appt to come in and discuss any problems or concerns with Dr Ardis Hughs or see his PCP.

## 2016-01-20 ENCOUNTER — Other Ambulatory Visit: Payer: Self-pay | Admitting: Family Medicine

## 2016-03-04 ENCOUNTER — Encounter: Payer: Self-pay | Admitting: Gastroenterology

## 2016-03-07 ENCOUNTER — Encounter: Payer: Self-pay | Admitting: Family Medicine

## 2016-03-11 ENCOUNTER — Other Ambulatory Visit (INDEPENDENT_AMBULATORY_CARE_PROVIDER_SITE_OTHER): Payer: BLUE CROSS/BLUE SHIELD

## 2016-03-11 DIAGNOSIS — Z0000 Encounter for general adult medical examination without abnormal findings: Secondary | ICD-10-CM

## 2016-03-11 DIAGNOSIS — R7989 Other specified abnormal findings of blood chemistry: Secondary | ICD-10-CM | POA: Diagnosis not present

## 2016-03-11 DIAGNOSIS — Z1159 Encounter for screening for other viral diseases: Secondary | ICD-10-CM

## 2016-03-11 LAB — LIPID PANEL
CHOL/HDL RATIO: 4
Cholesterol: 160 mg/dL (ref 0–200)
HDL: 38.4 mg/dL — AB (ref >39.00)
NONHDL: 121.74
TRIGLYCERIDES: 271 mg/dL — AB (ref 0.0–149.0)
VLDL: 54.2 mg/dL — ABNORMAL HIGH (ref 0.0–40.0)

## 2016-03-11 LAB — CBC WITH DIFFERENTIAL/PLATELET
BASOS PCT: 0.3 % (ref 0.0–3.0)
Basophils Absolute: 0 10*3/uL (ref 0.0–0.1)
EOS PCT: 3.5 % (ref 0.0–5.0)
Eosinophils Absolute: 0.2 10*3/uL (ref 0.0–0.7)
HEMATOCRIT: 50.8 % (ref 39.0–52.0)
HEMOGLOBIN: 17.4 g/dL — AB (ref 13.0–17.0)
LYMPHS PCT: 29.3 % (ref 12.0–46.0)
Lymphs Abs: 2 10*3/uL (ref 0.7–4.0)
MCHC: 34.3 g/dL (ref 30.0–36.0)
MCV: 89.3 fl (ref 78.0–100.0)
MONOS PCT: 5.7 % (ref 3.0–12.0)
Monocytes Absolute: 0.4 10*3/uL (ref 0.1–1.0)
Neutro Abs: 4.2 10*3/uL (ref 1.4–7.7)
Neutrophils Relative %: 61.2 % (ref 43.0–77.0)
Platelets: 246 10*3/uL (ref 150.0–400.0)
RBC: 5.69 Mil/uL (ref 4.22–5.81)
RDW: 13.2 % (ref 11.5–15.5)
WBC: 6.8 10*3/uL (ref 4.0–10.5)

## 2016-03-11 LAB — BASIC METABOLIC PANEL
BUN: 15 mg/dL (ref 6–23)
CHLORIDE: 104 meq/L (ref 96–112)
CO2: 27 mEq/L (ref 19–32)
Calcium: 9.1 mg/dL (ref 8.4–10.5)
Creatinine, Ser: 0.76 mg/dL (ref 0.40–1.50)
GFR: 113.09 mL/min (ref >60.00)
Glucose, Bld: 146 mg/dL — ABNORMAL HIGH (ref 70–99)
POTASSIUM: 4.6 meq/L (ref 3.5–5.1)
Sodium: 140 mEq/L (ref 135–145)

## 2016-03-11 LAB — PSA: PSA: 0.93 ng/mL (ref 0.10–4.00)

## 2016-03-11 LAB — HEPATIC FUNCTION PANEL
ALT: 25 U/L (ref 0–53)
AST: 31 U/L (ref 0–37)
Albumin: 4.4 g/dL (ref 3.5–5.2)
Alkaline Phosphatase: 53 U/L (ref 39–117)
BILIRUBIN DIRECT: 0.1 mg/dL (ref 0.0–0.3)
BILIRUBIN TOTAL: 0.8 mg/dL (ref 0.2–1.2)
Total Protein: 7.2 g/dL (ref 6.0–8.3)

## 2016-03-11 LAB — LDL CHOLESTEROL, DIRECT: Direct LDL: 96 mg/dL

## 2016-03-11 LAB — HEMOGLOBIN A1C: HEMOGLOBIN A1C: 6.6 % — AB (ref 4.6–6.5)

## 2016-03-11 LAB — TESTOSTERONE: Testosterone: 265.4 ng/dL — ABNORMAL LOW (ref 300.00–890.00)

## 2016-03-11 LAB — TSH: TSH: 2.47 u[IU]/mL (ref 0.35–4.50)

## 2016-03-12 ENCOUNTER — Encounter: Payer: Self-pay | Admitting: Family Medicine

## 2016-03-12 ENCOUNTER — Other Ambulatory Visit: Payer: Self-pay

## 2016-03-12 DIAGNOSIS — Z1159 Encounter for screening for other viral diseases: Secondary | ICD-10-CM

## 2016-03-13 DIAGNOSIS — Z1159 Encounter for screening for other viral diseases: Secondary | ICD-10-CM | POA: Diagnosis not present

## 2016-03-14 LAB — HEPATITIS C ANTIBODY: HCV AB: NEGATIVE

## 2016-03-16 ENCOUNTER — Other Ambulatory Visit: Payer: Self-pay | Admitting: Family Medicine

## 2016-03-17 ENCOUNTER — Encounter: Payer: Self-pay | Admitting: Family Medicine

## 2016-03-17 ENCOUNTER — Ambulatory Visit (INDEPENDENT_AMBULATORY_CARE_PROVIDER_SITE_OTHER): Payer: BLUE CROSS/BLUE SHIELD | Admitting: Family Medicine

## 2016-03-17 VITALS — BP 100/80 | HR 114 | Temp 98.6°F | Ht 74.5 in | Wt 297.0 lb

## 2016-03-17 DIAGNOSIS — E119 Type 2 diabetes mellitus without complications: Secondary | ICD-10-CM | POA: Diagnosis not present

## 2016-03-17 DIAGNOSIS — E669 Obesity, unspecified: Secondary | ICD-10-CM

## 2016-03-17 DIAGNOSIS — R229 Localized swelling, mass and lump, unspecified: Secondary | ICD-10-CM | POA: Diagnosis not present

## 2016-03-17 DIAGNOSIS — E785 Hyperlipidemia, unspecified: Secondary | ICD-10-CM

## 2016-03-17 DIAGNOSIS — Z0000 Encounter for general adult medical examination without abnormal findings: Secondary | ICD-10-CM | POA: Diagnosis not present

## 2016-03-17 DIAGNOSIS — E291 Testicular hypofunction: Secondary | ICD-10-CM

## 2016-03-17 DIAGNOSIS — R7989 Other specified abnormal findings of blood chemistry: Secondary | ICD-10-CM

## 2016-03-17 DIAGNOSIS — F418 Other specified anxiety disorders: Secondary | ICD-10-CM | POA: Insufficient documentation

## 2016-03-17 MED ORDER — LORAZEPAM 1 MG PO TABS: 1.0000 mg | ORAL_TABLET | Freq: Four times a day (QID) | ORAL | 0 refills | Status: DC | PRN

## 2016-03-17 NOTE — Progress Notes (Signed)
Subjective:     Patient ID: [name], male   DOB: [birth year], 55 y.o.   MRN: [id number]  HPI Patient here for complete physical. Past medical history significant for low testosterone, osteoarthritis, obesity, type 2 diabetes, dyslipidemia, history of benign pancreatic tumor, history of kidney stones, and history of tubular adenomas of the colon  He is due for repeat colonoscopy this year. Generally, has not felt well this year. He has ongoing arthritis issues which are limiting activities. He's had some mild weight gain. Not exercising. Increased fatigue.  He has been on testosterone replacement for quite some time but has had elevating hematocrit from recent labs and he does not feel this has helped his energy levels whatsoever.  Skin lesion left neck. This is a new issue. Noticed couple months ago with shaving. Sometimes bleeds. No prior history of skin cancer.  Nonpainful.  Requesting one refill of lorazepam. He and his wife have some upcoming travels and he takes this prior to travel because of anxiety. Uses infrequently  Past Medical History:  Diagnosis Date  . Arthritis   . BURSITIS, LEFT HIP 05/20/2010  . Cancer [facility]) 2010   pancreatic cancer  . Chronic kidney disease    stones  . Claustrophobia    "take Lorazepam if I have to fly"  . DIAB W/O COMP TYPE II/UNS NOT STATED UNCNTRL 03/30/2009  . ELEVATED BLOOD PRESSURE 03/30/2009  . Fissure, anal    occ bleeds still  . Headache(784.0)    "only when I was working; had headaches and migraines"  . Hyperlipidemia 10/11/2010  . SHOULDER PAIN, LEFT 10/20/2008   Past Surgical History:  Procedure Laterality Date  . ANAL FISSURE REPAIR  07/28/2011   Procedure: ANAL FISSURE REPAIR;  Surgeon: Adin Hector, MD;  Location: [facility];  Service: General;  Laterality: N/A;  Repair Anal Fissure/sphinterotomy and excision rectal polypl  . BACK SURGERY  2000   "had 2 cages put in"  . BIOPSY PANCREAS  2010   benign  . HERNIA REPAIR  [id number]   umbilical  . KIDNEY STONES  2010 & 2008 & 2005  . LAPAROSCOPIC INCISIONAL / UMBILICAL / VENTRAL HERNIA REPAIR  07/08/11   [facility]  . left rotator cuff  02/26/2011  . left shoulder bone spurs  2011  . LIPOMA EXCISION     "several; off back, arms"  . pancreatic tumor  2010  . right rotator cuff     2011  . TONSILLECTOMY  1967  . VASECTOMY  1994  . VENTRAL HERNIA REPAIR  07/08/2011   Procedure: LAPAROSCOPIC VENTRAL HERNIA;  Surgeon: Adin Hector, MD;  Location: [place];  Service: General;  Laterality: N/A;  Laparoscopic repair ventral hernia with mesh, Lysis of adhesions.    reports that he has never smoked. He has never used smokeless tobacco. He reports that he does not drink alcohol or use drugs. family history includes Cancer in his maternal grandfather; Colon cancer in his maternal grandfather; Diabetes in his brother; Heart attack (age of onset: 63) in his father; Heart disease in his father; Stroke in his father. Allergies  Allergen Reactions  . Merthiolate [Thimerosol]     Unknown reaction  . Morphine     REACTION: paronia       Review of Systems  Constitutional: Positive for fatigue. Negative for activity change, appetite change, fever and unexpected weight change.  HENT: Negative for congestion, ear pain and trouble swallowing.   Eyes: Negative for pain and visual disturbance.  Respiratory: Negative for cough, shortness of breath and wheezing.   Cardiovascular: Negative for chest pain and palpitations.  Gastrointestinal: Negative for abdominal distention, abdominal pain, blood in stool, constipation, diarrhea, nausea, rectal pain and vomiting.  Genitourinary: Negative for dysuria, hematuria and testicular pain.  Musculoskeletal: Positive for arthralgias. Negative for joint swelling.  Skin: Negative for rash.  Neurological: Negative for dizziness, syncope and headaches.  Hematological: Negative for adenopathy.  Psychiatric/Behavioral: Negative for confusion and dysphoric  mood.       Objective:   Physical Exam  Constitutional: He is oriented to person, place, and time. He appears well-developed and well-nourished.  HENT:  Head: Normocephalic.  Right Ear: External ear normal.  Left Ear: External ear normal.  Mouth/Throat: Oropharynx is clear and moist.  Neck: Neck supple. No thyromegaly present.  Cardiovascular: Normal rate and regular rhythm.  Exam reveals no gallop.   Pulmonary/Chest: Effort normal and breath sounds normal. No respiratory distress. He has no wheezes. He has no rales.  Musculoskeletal: He exhibits no edema.  Lymphadenopathy:    He has no cervical adenopathy.  Neurological: He is alert and oriented to person, place, and time. No cranial nerve deficit.  Skin:  Patient has nodular lesion left neck was somewhat umbilicated and ulcerative center. Approximately 8-10 mm diameter  Psychiatric: He has a normal mood and affect. His behavior is normal.       Assessment:     #1 complete physical. Labs reviewed with patient. He is due for repeat colonoscopy secondary to prior history of tubular adenomas. Needs to lose substantial weight  #2 history of tubular adenomas as above  #3 probable basal cell carcinoma left neck  #4 history of low testosterone not symptomatically improved on replacement  #5 increasing hematocrit related to testosterone replacement most likely  #6 situational anxiety  #7 type 2 diabetes with history of fair control    Plan:     -Set up dermatology referral regarding likely basal cell carcinoma -Strongly encouraged to lose some weight and we discussed importance of scaling back calories and establishing more consistent exercise -Follow-up with GI regarding repeat colonoscopy -Discontinue testosterone given recent increases in hematocrit and lack of symptom improvement on replacement.  - refill lorazepam -to take 1 hour prior to travel -Labs reviewed with patient  [name] MD [facility] at  [facility]

## 2016-03-17 NOTE — Progress Notes (Signed)
Pre visit review using our clinic review tool, if applicable. No additional management support is needed unless otherwise documented below in the visit note. 

## 2016-03-17 NOTE — Patient Instructions (Signed)
Go ahead and get follow up colonoscopy Stop the testosterone We will set up dermatology referral.   Continue with yearly eye exam.

## 2016-03-26 DIAGNOSIS — D1801 Hemangioma of skin and subcutaneous tissue: Secondary | ICD-10-CM | POA: Diagnosis not present

## 2016-03-26 DIAGNOSIS — D225 Melanocytic nevi of trunk: Secondary | ICD-10-CM | POA: Diagnosis not present

## 2016-03-26 DIAGNOSIS — C44319 Basal cell carcinoma of skin of other parts of face: Secondary | ICD-10-CM | POA: Diagnosis not present

## 2016-03-31 ENCOUNTER — Telehealth: Payer: Self-pay | Admitting: Family Medicine

## 2016-03-31 NOTE — Telephone Encounter (Signed)
Pt would like to change his preferred pharmacy to  [facility] [place]  Pt having issues getting refills

## 2016-03-31 NOTE — Telephone Encounter (Signed)
Pharmacy up dated for patient.

## 2016-04-02 DIAGNOSIS — H5213 Myopia, bilateral: Secondary | ICD-10-CM | POA: Diagnosis not present

## 2016-04-02 DIAGNOSIS — E119 Type 2 diabetes mellitus without complications: Secondary | ICD-10-CM | POA: Diagnosis not present

## 2016-04-18 DIAGNOSIS — Z23 Encounter for immunization: Secondary | ICD-10-CM | POA: Diagnosis not present

## 2016-05-09 ENCOUNTER — Encounter: Payer: Self-pay | Admitting: Family Medicine

## 2016-05-15 ENCOUNTER — Other Ambulatory Visit: Payer: Self-pay | Admitting: Family Medicine

## 2016-06-09 DIAGNOSIS — Z85828 Personal history of other malignant neoplasm of skin: Secondary | ICD-10-CM | POA: Diagnosis not present

## 2016-06-09 DIAGNOSIS — Z08 Encounter for follow-up examination after completed treatment for malignant neoplasm: Secondary | ICD-10-CM | POA: Diagnosis not present

## 2016-06-15 ENCOUNTER — Encounter: Payer: Self-pay | Admitting: Family Medicine

## 2016-06-17 MED ORDER — CYCLOBENZAPRINE HCL 10 MG PO TABS: 10.0000 mg | ORAL_TABLET | Freq: Three times a day (TID) | ORAL | 5 refills | Status: DC | PRN

## 2016-06-19 MED ORDER — CYCLOBENZAPRINE HCL 10 MG PO TABS: 10.0000 mg | ORAL_TABLET | Freq: Three times a day (TID) | ORAL | 5 refills | Status: DC | PRN

## 2016-06-19 NOTE — Addendum Note (Signed)
Addended by: Wynn Banker H on: 06/19/2016 09:09 AM   Modules accepted: Orders

## 2016-07-03 ENCOUNTER — Encounter: Payer: Self-pay | Admitting: Gastroenterology

## 2016-07-19 DIAGNOSIS — M238X2 Other internal derangements of left knee: Secondary | ICD-10-CM | POA: Diagnosis not present

## 2016-07-19 DIAGNOSIS — M25562 Pain in left knee: Secondary | ICD-10-CM | POA: Diagnosis not present

## 2016-07-19 DIAGNOSIS — M25561 Pain in right knee: Secondary | ICD-10-CM | POA: Diagnosis not present

## 2016-07-25 ENCOUNTER — Other Ambulatory Visit: Payer: Self-pay | Admitting: Orthopedic Surgery

## 2016-07-25 DIAGNOSIS — M25562 Pain in left knee: Secondary | ICD-10-CM

## 2016-08-03 ENCOUNTER — Ambulatory Visit
Admission: RE | Admit: 2016-08-03 | Discharge: 2016-08-03 | Disposition: A | Discharge status: Home / Self Care | Payer: BLUE CROSS/BLUE SHIELD | Source: Ambulatory Visit | Attending: Orthopedic Surgery | Admitting: Orthopedic Surgery

## 2016-08-03 DIAGNOSIS — M25562 Pain in left knee: Secondary | ICD-10-CM | POA: Diagnosis not present

## 2016-08-15 ENCOUNTER — Encounter: Payer: Self-pay | Admitting: Family Medicine

## 2016-08-18 ENCOUNTER — Other Ambulatory Visit: Payer: Self-pay

## 2016-08-18 MED ORDER — CELECOXIB 200 MG PO CAPS: ORAL_CAPSULE | ORAL | 5 refills | Status: DC

## 2016-08-18 MED ORDER — METFORMIN HCL 500 MG PO TABS: 500.0000 mg | ORAL_TABLET | Freq: Two times a day (BID) | ORAL | 5 refills | Status: DC

## 2016-09-15 ENCOUNTER — Ambulatory Visit (AMBULATORY_SURGERY_CENTER): Payer: Self-pay

## 2016-09-15 ENCOUNTER — Encounter: Payer: Self-pay | Admitting: Gastroenterology

## 2016-09-15 ENCOUNTER — Telehealth: Payer: Self-pay

## 2016-09-15 VITALS — Ht 75.0 in | Wt 297.8 lb

## 2016-09-15 DIAGNOSIS — Z860100 Personal history of colon polyps, unspecified: Secondary | ICD-10-CM

## 2016-09-15 MED ORDER — SUPREP BOWEL PREP KIT 17.5-3.13-1.6 GM/177ML PO SOLN: 1.0000 | Freq: Once | ORAL | 0 refills | Status: AC

## 2016-09-15 NOTE — Telephone Encounter (Signed)
Dr Ardis Hughs,      Knee arthroscopy scheduled for Thursday 09/18/16.  Colon with you on Monday 09/29/16.  Pt very concerned you may have him reschedule because his wife is a nurse who has to give a 3 month notice for days off and he really wants to proceed as scheduled.  OK to go ahead as planned?                                                                                                                                                    Thank you,                                                                                                                                                             Alyas Creary/PV

## 2016-09-15 NOTE — Progress Notes (Signed)
No allergies to eggs or soy No past problems with anesthesia No diet meds No home oxygen  Declined emmi 

## 2016-09-15 NOTE — Telephone Encounter (Signed)
It is OK to continue as scheduled

## 2016-09-18 DIAGNOSIS — M94262 Chondromalacia, left knee: Secondary | ICD-10-CM | POA: Diagnosis not present

## 2016-09-18 DIAGNOSIS — S83242A Other tear of medial meniscus, current injury, left knee, initial encounter: Secondary | ICD-10-CM | POA: Diagnosis not present

## 2016-09-18 DIAGNOSIS — G8918 Other acute postprocedural pain: Secondary | ICD-10-CM | POA: Diagnosis not present

## 2016-09-18 DIAGNOSIS — M23222 Derangement of posterior horn of medial meniscus due to old tear or injury, left knee: Secondary | ICD-10-CM | POA: Diagnosis not present

## 2016-09-25 DIAGNOSIS — M25562 Pain in left knee: Secondary | ICD-10-CM | POA: Diagnosis not present

## 2016-09-29 ENCOUNTER — Encounter: Payer: Self-pay | Admitting: Gastroenterology

## 2016-09-29 ENCOUNTER — Ambulatory Visit (AMBULATORY_SURGERY_CENTER): Payer: BLUE CROSS/BLUE SHIELD | Admitting: Gastroenterology

## 2016-09-29 VITALS — BP 118/79 | HR 65 | Temp 97.8°F | Resp 14 | Ht 75.0 in | Wt 297.0 lb

## 2016-09-29 DIAGNOSIS — Z86010 Personal history of colonic polyps: Secondary | ICD-10-CM

## 2016-09-29 DIAGNOSIS — K635 Polyp of colon: Secondary | ICD-10-CM | POA: Diagnosis not present

## 2016-09-29 DIAGNOSIS — D125 Benign neoplasm of sigmoid colon: Secondary | ICD-10-CM

## 2016-09-29 MED ORDER — SODIUM CHLORIDE 0.9 % IV SOLN: 500.0000 mL | INTRAVENOUS | Status: DC

## 2016-09-29 NOTE — Progress Notes (Signed)
Report given to PACU, vss 

## 2016-09-29 NOTE — Progress Notes (Signed)
Pt's states no medical or surgical changes since previsit or office visit. 

## 2016-09-29 NOTE — Op Note (Signed)
[place] Patient Name: [name] Procedure Date: 09/29/2016 8:03 AM MRN: [id number] Endoscopist: Milus Banister , MD Age: 56 Referring MD:  Date of Birth: 10/08/1960 Gender: Male Account #: [id number] Procedure:                Colonoscopy Indications:              High risk colon cancer surveillance: Personal                            history of colonic polyps: colonsocopy 2012 Dr.                            Ardis Hughs two subCM adenomas removed Medicines:                Monitored Anesthesia Care Procedure:                Pre-Anesthesia Assessment:                           - Prior to the procedure, a History and Physical                            was performed, and patient medications and                            allergies were reviewed. [name] tolerance of                            previous anesthesia was also reviewed. The risks                            and benefits of the procedure and the sedation                            options and risks were discussed with the patient.                            All questions were answered, and informed consent                            was obtained. Prior Anticoagulants: The patient has                            taken no previous anticoagulant or antiplatelet                            agents. ASA Grade Assessment: II - A patient with                            mild systemic disease. After reviewing the risks                            and benefits, the patient was deemed in  satisfactory condition to undergo the procedure.                           After obtaining informed consent, the colonoscope                            was passed under direct vision. Throughout the                            procedure, the patient's blood pressure, pulse, and                            oxygen saturations were monitored continuously. The                            Model CF-HQ190L [id number]) scope  was introduced                            through the anus and advanced to the the cecum,                            identified by appendiceal orifice and ileocecal                            valve. The colonoscopy was performed without                            difficulty. The patient tolerated the procedure                            well. The quality of the bowel preparation was                            good. The ileocecal valve, appendiceal orifice, and                            rectum were photographed. Scope In: 8:20:59 AM Scope Out: 8:34:06 AM Scope Withdrawal Time: 0 hours 8 minutes 47 seconds  Total Procedure Duration: 0 hours 13 minutes 7 seconds  Findings:                 A 2 mm polyp was found in the sigmoid colon. The                            polyp was sessile. The polyp was removed with a                            cold snare. Resection and retrieval were complete.                           The exam was otherwise without abnormality on                            direct and retroflexion views. Complications:  No immediate complications. Estimated Blood Loss:     Estimated blood loss: none. Impression:               - One 2 mm polyp in the sigmoid colon, removed with                            a cold snare. Resected and retrieved.                           - The examination was otherwise normal on direct                            and retroflexion views. Recommendation:           - Patient has a contact number available for                            emergencies. The signs and symptoms of potential                            delayed complications were discussed with the                            patient. Return to normal activities tomorrow.                            Written discharge instructions were provided to the                            patient.                           - Resume previous diet.                           - Continue present  medications.                           You will receive a letter within 2-3 weeks with the                            pathology results and my final recommendations.                           If the polyp(s) is proven to be 'pre-cancerous' on                            pathology, you will need repeat colonoscopy in 5                            years. If the polyp(s) is NOT 'precancerous' on                            pathology then you should repeat colon cancer  screening in 10 years with colonoscopy without need                            for colon cancer screening by any method prior to                            then (including stool testing). Milus Banister, MD 09/29/2016 8:36:19 AM This report has been signed electronically.

## 2016-09-29 NOTE — Patient Instructions (Signed)
YOU HAD AN ENDOSCOPIC PROCEDURE TODAY AT [facility]:   Refer to the procedure report that was given to you for any specific questions about what was found during the examination.  If the procedure report does not answer your questions, please call your gastroenterologist to clarify.  If you requested that your care partner not be given the details of your procedure findings, then the procedure report has been included in a sealed envelope for you to review at your convenience later.  YOU SHOULD EXPECT: Some feelings of bloating in the abdomen. Passage of more gas than usual.  Walking can help get rid of the air that was put into your GI tract during the procedure and reduce the bloating. If you had a lower endoscopy (such as a colonoscopy or flexible sigmoidoscopy) you may notice spotting of blood in your stool or on the toilet paper. If you underwent a bowel prep for your procedure, you may not have a normal bowel movement for a few days.  Please Note:  You might notice some irritation and congestion in your nose or some drainage.  This is from the oxygen used during your procedure.  There is no need for concern and it should clear up in a day or so.  SYMPTOMS TO REPORT IMMEDIATELY:   Following lower endoscopy (colonoscopy or flexible sigmoidoscopy):  Excessive amounts of blood in the stool  Significant tenderness or worsening of abdominal pains  Swelling of the abdomen that is new, acute  Fever of 100F or higher  For urgent or emergent issues, a gastroenterologist can be reached at any hour by calling [phone].   DIET:  We do recommend a small meal at first, but then you may proceed to your regular diet.  Drink plenty of fluids but you should avoid alcoholic beverages for 24 hours.  MEDICATIONS:  Continue present medications.  Please read the hand-outs given to you by your recovery nurse.  ACTIVITY:  You should plan to take it easy for the rest of today and you  should NOT DRIVE or use heavy machinery until tomorrow (because of the sedation medicines used during the test).    FOLLOW UP: Our staff will call the number listed on your records the next business day following your procedure to check on you and address any questions or concerns that you may have regarding the information given to you following your procedure. If we do not reach you, we will leave a message.  However, if you are feeling well and you are not experiencing any problems, there is no need to return our call.  We will assume that you have returned to your regular daily activities without incident.  If any biopsies were taken you will be contacted by phone or by letter within the next 1-3 weeks.  Please call us at [phone] if you have not heard about the biopsies in 3 weeks.   Thank you for allowing [place] to provide for your healthcare needs today.  SIGNATURES/CONFIDENTIALITY: You and/or your care partner have signed paperwork which will be entered into your electronic medical record.  These signatures attest to the fact that that the information above on your After Visit Summary has been reviewed and is understood.  Full responsibility of the confidentiality of this discharge information lies with you and/or your care-partner.

## 2016-09-29 NOTE — Progress Notes (Signed)
Called to room to assist during endoscopic procedure.  Patient ID and intended procedure confirmed with present staff. Received instructions for my participation in the procedure from the performing physician.  

## 2016-09-30 ENCOUNTER — Telehealth: Payer: Self-pay | Admitting: *Deleted

## 2016-09-30 NOTE — Telephone Encounter (Signed)
  Follow up Call-  Call back number 09/29/2016  Post procedure Call Back phone  # [phone]  Permission to leave phone message Yes  Some recent data might be hidden     Patient questions:  Do you have a fever, pain , or abdominal swelling? No. Pain Score  0 *  Have you tolerated food without any problems? Yes.    Have you been able to return to your normal activities? Yes.    Do you have any questions about your discharge instructions: Diet   No. Medications  No. Follow up visit  No.  Do you have questions or concerns about your Care? No.  Actions: * If pain score is 4 or above: No action needed, pain <4.

## 2016-10-06 ENCOUNTER — Encounter: Payer: Self-pay | Admitting: Gastroenterology

## 2016-10-16 ENCOUNTER — Encounter: Payer: Self-pay | Admitting: Family Medicine

## 2016-10-17 ENCOUNTER — Other Ambulatory Visit: Payer: Self-pay | Admitting: *Deleted

## 2016-10-17 MED ORDER — ATORVASTATIN CALCIUM 20 MG PO TABS: 20.0000 mg | ORAL_TABLET | Freq: Every day | ORAL | 3 refills | Status: DC

## 2016-12-09 ENCOUNTER — Encounter: Payer: Self-pay | Admitting: Family Medicine

## 2016-12-09 MED ORDER — CELEBREX 200 MG PO CAPS: 200.0000 mg | ORAL_CAPSULE | Freq: Two times a day (BID) | ORAL | 1 refills | Status: DC

## 2016-12-10 MED ORDER — CELEBREX 200 MG PO CAPS
200.0000 mg | ORAL_CAPSULE | Freq: Two times a day (BID) | ORAL | 1 refills | Status: DC
Start: 2016-12-10 — End: 2017-02-22

## 2017-01-15 DIAGNOSIS — H903 Sensorineural hearing loss, bilateral: Secondary | ICD-10-CM | POA: Diagnosis not present

## 2017-01-20 ENCOUNTER — Other Ambulatory Visit: Payer: Self-pay | Admitting: Family Medicine

## 2017-01-22 DIAGNOSIS — Z4789 Encounter for other orthopedic aftercare: Secondary | ICD-10-CM | POA: Diagnosis not present

## 2017-01-22 DIAGNOSIS — M25562 Pain in left knee: Secondary | ICD-10-CM | POA: Diagnosis not present

## 2017-02-16 ENCOUNTER — Other Ambulatory Visit: Payer: Self-pay | Admitting: Family Medicine

## 2017-02-16 MED ORDER — LORAZEPAM 1 MG PO TABS: 1.0000 mg | ORAL_TABLET | Freq: Four times a day (QID) | ORAL | 0 refills | Status: DC | PRN

## 2017-02-16 NOTE — Telephone Encounter (Signed)
LOV 03/17/16  Last refill Lorazepam 03/17/16, #30, 0 refills  Please advise. Thanks!

## 2017-02-16 NOTE — Telephone Encounter (Signed)
LOV 03/17/16  Last Lorazepam refill 03/17/16, #30, 0 refills  Please advise. Thanks!

## 2017-02-16 NOTE — Telephone Encounter (Signed)
Refill once 

## 2017-02-18 ENCOUNTER — Other Ambulatory Visit: Payer: Self-pay | Admitting: Family Medicine

## 2017-02-22 ENCOUNTER — Other Ambulatory Visit: Payer: Self-pay | Admitting: Family Medicine

## 2017-03-11 DIAGNOSIS — Z4789 Encounter for other orthopedic aftercare: Secondary | ICD-10-CM | POA: Diagnosis not present

## 2017-03-11 DIAGNOSIS — M25562 Pain in left knee: Secondary | ICD-10-CM | POA: Diagnosis not present

## 2017-03-19 DIAGNOSIS — M25562 Pain in left knee: Secondary | ICD-10-CM | POA: Diagnosis not present

## 2017-03-22 ENCOUNTER — Other Ambulatory Visit: Payer: Self-pay | Admitting: Family Medicine

## 2017-03-23 ENCOUNTER — Ambulatory Visit (INDEPENDENT_AMBULATORY_CARE_PROVIDER_SITE_OTHER): Payer: BLUE CROSS/BLUE SHIELD | Admitting: Family Medicine

## 2017-03-23 ENCOUNTER — Encounter: Payer: Self-pay | Admitting: Family Medicine

## 2017-03-23 VITALS — BP 102/78 | HR 96 | Temp 98.3°F | Ht 74.5 in | Wt 295.5 lb

## 2017-03-23 DIAGNOSIS — Z125 Encounter for screening for malignant neoplasm of prostate: Secondary | ICD-10-CM

## 2017-03-23 DIAGNOSIS — Z0000 Encounter for general adult medical examination without abnormal findings: Secondary | ICD-10-CM

## 2017-03-23 DIAGNOSIS — Z23 Encounter for immunization: Secondary | ICD-10-CM | POA: Diagnosis not present

## 2017-03-23 LAB — CBC WITH DIFFERENTIAL/PLATELET
BASOS PCT: 0.3 % (ref 0.0–3.0)
Basophils Absolute: 0 10*3/uL (ref 0.0–0.1)
EOS ABS: 0.2 10*3/uL (ref 0.0–0.7)
Eosinophils Relative: 3.5 % (ref 0.0–5.0)
HEMATOCRIT: 50.5 % (ref 39.0–52.0)
Hemoglobin: 17.3 g/dL — ABNORMAL HIGH (ref 13.0–17.0)
Lymphocytes Relative: 28.8 % (ref 12.0–46.0)
Lymphs Abs: 1.7 10*3/uL (ref 0.7–4.0)
MCHC: 34.3 g/dL (ref 30.0–36.0)
MCV: 91.2 fl (ref 78.0–100.0)
MONO ABS: 0.4 10*3/uL (ref 0.1–1.0)
Monocytes Relative: 6.4 % (ref 3.0–12.0)
NEUTROS ABS: 3.6 10*3/uL (ref 1.4–7.7)
Neutrophils Relative %: 61 % (ref 43.0–77.0)
PLATELETS: 247 10*3/uL (ref 150.0–400.0)
RBC: 5.53 Mil/uL (ref 4.22–5.81)
RDW: 13.1 % (ref 11.5–15.5)
WBC: 6 10*3/uL (ref 4.0–10.5)

## 2017-03-23 LAB — BASIC METABOLIC PANEL
BUN: 15 mg/dL (ref 6–23)
CHLORIDE: 103 meq/L (ref 96–112)
CO2: 27 meq/L (ref 19–32)
Calcium: 9.8 mg/dL (ref 8.4–10.5)
Creatinine, Ser: 0.68 mg/dL (ref 0.40–1.50)
GFR: 128.1 mL/min (ref >60.00)
Glucose, Bld: 243 mg/dL — ABNORMAL HIGH (ref 70–99)
Potassium: 4.5 mEq/L (ref 3.5–5.1)
SODIUM: 140 meq/L (ref 135–145)

## 2017-03-23 LAB — LIPID PANEL
CHOL/HDL RATIO: 3
CHOLESTEROL: 144 mg/dL (ref 0–200)
HDL: 42.6 mg/dL (ref >39.00)
NONHDL: 101.11
TRIGLYCERIDES: 295 mg/dL — AB (ref 0.0–149.0)
VLDL: 59 mg/dL — ABNORMAL HIGH (ref 0.0–40.0)

## 2017-03-23 LAB — TSH: TSH: 3.24 u[IU]/mL (ref 0.35–4.50)

## 2017-03-23 LAB — MICROALBUMIN / CREATININE URINE RATIO
Creatinine,U: 130.8 mg/dL
MICROALB UR: 5.5 mg/dL — AB (ref 0.0–1.9)
Microalb Creat Ratio: 4.2 mg/g (ref 0.0–30.0)

## 2017-03-23 LAB — HEPATIC FUNCTION PANEL
ALT: 44 U/L (ref 0–53)
AST: 55 U/L — ABNORMAL HIGH (ref 0–37)
Albumin: 4.4 g/dL (ref 3.5–5.2)
Alkaline Phosphatase: 68 U/L (ref 39–117)
BILIRUBIN TOTAL: 0.9 mg/dL (ref 0.2–1.2)
Bilirubin, Direct: 0.2 mg/dL (ref 0.0–0.3)
Total Protein: 8 g/dL (ref 6.0–8.3)

## 2017-03-23 LAB — PSA: PSA: 0.77 ng/mL (ref 0.10–4.00)

## 2017-03-23 LAB — HEMOGLOBIN A1C: HEMOGLOBIN A1C: 9 % — AB (ref 4.6–6.5)

## 2017-03-23 LAB — LDL CHOLESTEROL, DIRECT: Direct LDL: 83 mg/dL

## 2017-03-23 MED ORDER — LORAZEPAM 1 MG PO TABS: 1.0000 mg | ORAL_TABLET | Freq: Four times a day (QID) | ORAL | 0 refills | Status: DC | PRN

## 2017-03-23 NOTE — Progress Notes (Signed)
Subjective:     Patient ID: [name], male   DOB: 14-Jan-1961, 56 y.o.   MRN: [id number]  HPI Patient seen for physical exam. He has recently had some left knee problems and is looking at possible left total knee replacement some point this year. Chronic problems include history of type 2 diabetes, osteoarthritis, hyperlipidemia, history of kidney stones.  Colonoscopy up-to-date. No history of previous Pneumovax and he declines. He needs tetanus booster and also has not had previous shingles vaccine.  Past Medical History:  Diagnosis Date  . Arthritis   . BURSITIS, LEFT HIP 05/20/2010  . Cancer [facility]) 2010   pancreatic cancer  . Chronic kidney disease    stones  . Claustrophobia    "take Lorazepam if I have to fly"  . DIAB W/O COMP TYPE II/UNS NOT STATED UNCNTRL 03/30/2009  . ELEVATED BLOOD PRESSURE 03/30/2009  . Fissure, anal    occ bleeds still  . Headache(784.0)    "only when I was working; had headaches and migraines"  . Hyperlipidemia 10/11/2010  . SHOULDER PAIN, LEFT 10/20/2008   Past Surgical History:  Procedure Laterality Date  . ANAL FISSURE REPAIR  07/28/2011   Procedure: ANAL FISSURE REPAIR;  Surgeon: Adin Hector, MD;  Location: [facility];  Service: General;  Laterality: N/A;  Repair Anal Fissure/sphinterotomy and excision rectal polypl  . BACK SURGERY  2000   "had 2 cages put in"  . BIOPSY PANCREAS  2010   benign  . HERNIA REPAIR  7829   umbilical  . KIDNEY STONES  2010 & 2008 & 2005  . LAPAROSCOPIC INCISIONAL / UMBILICAL / VENTRAL HERNIA REPAIR  07/08/11   [facility]  . left rotator cuff  02/26/2011  . left shoulder bone spurs  2011  . LIPOMA EXCISION     "several; off back, arms"  . pancreatic tumor  2010  . right rotator cuff     2011  . TONSILLECTOMY  1967  . VASECTOMY  1994  . VENTRAL HERNIA REPAIR  07/08/2011   Procedure: LAPAROSCOPIC VENTRAL HERNIA;  Surgeon: Adin Hector, MD;  Location: [facility];  Service: General;  Laterality: N/A;  Laparoscopic repair  ventral hernia with mesh, Lysis of adhesions.    reports that he has never smoked. He has never used smokeless tobacco. He reports that he does not drink alcohol or use drugs. family history includes Cancer in his maternal grandfather; Colon cancer (age of onset: 76) in his maternal grandfather; Diabetes in his brother; Heart attack (age of onset: 59) in his father; Heart disease in his father; Stroke in his father. Allergies  Allergen Reactions  . Merthiolate [Thimerosol]     Unknown reaction  . Morphine     REACTION: paronia       Review of Systems  Constitutional: Negative for activity change, appetite change, fatigue and fever.  HENT: Negative for congestion, ear pain and trouble swallowing.   Eyes: Negative for pain and visual disturbance.  Respiratory: Negative for cough, shortness of breath and wheezing.   Cardiovascular: Negative for chest pain and palpitations.  Gastrointestinal: Negative for abdominal distention, abdominal pain, blood in stool, constipation, diarrhea, nausea, rectal pain and vomiting.  Genitourinary: Negative for dysuria, hematuria and testicular pain.  Musculoskeletal: Negative for arthralgias and joint swelling.  Skin: Negative for rash.  Neurological: Negative for dizziness, syncope and headaches.  Hematological: Negative for adenopathy.  Psychiatric/Behavioral: Negative for confusion and dysphoric mood.       Objective:   Physical Exam  Constitutional: He is oriented to person, place, and time. He appears well-developed and well-nourished. No distress.  HENT:  Head: Normocephalic and atraumatic.  Right Ear: External ear normal.  Left Ear: External ear normal.  Mouth/Throat: Oropharynx is clear and moist.  Eyes: Pupils are equal, round, and reactive to light. Conjunctivae and EOM are normal.  Neck: Normal range of motion. Neck supple. No thyromegaly present.  Cardiovascular: Normal rate, regular rhythm and normal heart sounds.   No murmur  heard. Pulmonary/Chest: No respiratory distress. He has no wheezes. He has no rales.  Abdominal: Soft. Bowel sounds are normal. He exhibits no distension and no mass. There is no tenderness. There is no rebound and no guarding.  Musculoskeletal: He exhibits no edema.  Lymphadenopathy:    He has no cervical adenopathy.  Neurological: He is alert and oriented to person, place, and time. He displays normal reflexes. No cranial nerve deficit.  Skin: No rash noted.  Psychiatric: He has a normal mood and affect.       Assessment:     Physical exam. History of type 2 diabetes which has been controlled on metformin     Plan:     -Obtain screening lab work. Include PSA after discussion of pros and cons. Include urine microalbumin screen and repeat A1c  -Home glucose monitor given  -He is encouraged to lose some weight. Hopefully can increase exercise after his knee replacement recovery   [name] MD [facility]

## 2017-03-24 ENCOUNTER — Other Ambulatory Visit: Payer: Self-pay | Admitting: Family Medicine

## 2017-03-24 MED ORDER — METFORMIN HCL 500 MG PO TABS: ORAL_TABLET | ORAL | 2 refills | Status: DC

## 2017-03-25 DIAGNOSIS — M1712 Unilateral primary osteoarthritis, left knee: Secondary | ICD-10-CM | POA: Diagnosis not present

## 2017-03-25 DIAGNOSIS — M25562 Pain in left knee: Secondary | ICD-10-CM | POA: Diagnosis not present

## 2017-03-26 ENCOUNTER — Telehealth: Payer: Self-pay | Admitting: Family Medicine

## 2017-03-26 MED ORDER — CONTOUR NEXT MONITOR W/DEVICE KIT: 1.0000 | PACK | Freq: Once | 0 refills | Status: AC

## 2017-03-26 MED ORDER — GLUCOSE BLOOD VI STRP: ORAL_STRIP | 3 refills | Status: DC

## 2017-03-26 NOTE — Telephone Encounter (Signed)
Rx done. 

## 2017-03-26 NOTE — Telephone Encounter (Signed)
Pts wife is calling stating that the Blood Sugar Meter that was given the insurance will not cover.  Pt state the insurance will cover Contour Next Meter and test strips (pt will need both pls)  Pharm:  [facility] [address] ([place])  [phone]

## 2017-03-30 DIAGNOSIS — L570 Actinic keratosis: Secondary | ICD-10-CM | POA: Diagnosis not present

## 2017-03-30 DIAGNOSIS — X32XXXD Exposure to sunlight, subsequent encounter: Secondary | ICD-10-CM | POA: Diagnosis not present

## 2017-03-30 DIAGNOSIS — D225 Melanocytic nevi of trunk: Secondary | ICD-10-CM | POA: Diagnosis not present

## 2017-03-30 DIAGNOSIS — Z85828 Personal history of other malignant neoplasm of skin: Secondary | ICD-10-CM | POA: Diagnosis not present

## 2017-03-30 DIAGNOSIS — Z1283 Encounter for screening for malignant neoplasm of skin: Secondary | ICD-10-CM | POA: Diagnosis not present

## 2017-03-30 DIAGNOSIS — Z08 Encounter for follow-up examination after completed treatment for malignant neoplasm: Secondary | ICD-10-CM | POA: Diagnosis not present

## 2017-04-01 ENCOUNTER — Encounter: Payer: Self-pay | Admitting: Family Medicine

## 2017-04-03 ENCOUNTER — Encounter: Payer: Self-pay | Admitting: Family Medicine

## 2017-04-03 DIAGNOSIS — E119 Type 2 diabetes mellitus without complications: Secondary | ICD-10-CM | POA: Diagnosis not present

## 2017-04-03 DIAGNOSIS — H524 Presbyopia: Secondary | ICD-10-CM | POA: Diagnosis not present

## 2017-04-03 LAB — HM DIABETES EYE EXAM

## 2017-04-07 ENCOUNTER — Encounter: Payer: Self-pay | Admitting: Family Medicine

## 2017-04-08 ENCOUNTER — Telehealth: Payer: Self-pay | Admitting: Family Medicine

## 2017-04-08 MED ORDER — LANCET DEVICE MISC: 3 refills | Status: AC

## 2017-04-08 MED ORDER — EMPAGLIFLOZIN 10 MG PO TABS: 10.0000 mg | ORAL_TABLET | Freq: Every day | ORAL | 1 refills | Status: DC

## 2017-04-08 MED ORDER — METFORMIN HCL ER (MOD) 1000 MG PO TB24: 1000.0000 mg | ORAL_TABLET | Freq: Every day | ORAL | 1 refills | Status: DC

## 2017-04-08 NOTE — Telephone Encounter (Signed)
Received PA request for [place]. PA submitted & is pending. Key: [id number]

## 2017-04-09 ENCOUNTER — Telehealth: Payer: Self-pay | Admitting: Family Medicine

## 2017-04-09 NOTE — Telephone Encounter (Addendum)
Pt is having upcoming surgery knee replacement on 04-28-17 and the facility is faxing a form over. Pt would like rachel to return his call concerning blood sugars.

## 2017-04-09 NOTE — Telephone Encounter (Signed)
Form received and noted that patient's glucose is averaging around 150.  Dr Alvan Dame will do surgery as long as patient's AIC is 7.5 or below.

## 2017-04-09 NOTE — Progress Notes (Signed)
Please place orders in EPIC as patient is being scheduled for a pre-op appointment! Thank you! 

## 2017-04-10 DIAGNOSIS — Z23 Encounter for immunization: Secondary | ICD-10-CM | POA: Diagnosis not present

## 2017-04-10 NOTE — Telephone Encounter (Signed)
Form placed in basket for medical records

## 2017-04-13 ENCOUNTER — Encounter (HOSPITAL_COMMUNITY)
Admission: RE | Admit: 2017-04-13 | Discharge: 2017-04-13 | Disposition: A | Discharge status: Home / Self Care | Payer: BLUE CROSS/BLUE SHIELD | Source: Ambulatory Visit | Attending: Orthopedic Surgery | Admitting: Orthopedic Surgery

## 2017-04-13 ENCOUNTER — Encounter (HOSPITAL_COMMUNITY): Payer: Self-pay

## 2017-04-13 ENCOUNTER — Other Ambulatory Visit (HOSPITAL_COMMUNITY): Payer: Self-pay | Admitting: Emergency Medicine

## 2017-04-13 DIAGNOSIS — I10 Essential (primary) hypertension: Secondary | ICD-10-CM | POA: Diagnosis not present

## 2017-04-13 DIAGNOSIS — M1712 Unilateral primary osteoarthritis, left knee: Secondary | ICD-10-CM | POA: Diagnosis not present

## 2017-04-13 DIAGNOSIS — Z01810 Encounter for preprocedural cardiovascular examination: Secondary | ICD-10-CM | POA: Insufficient documentation

## 2017-04-13 DIAGNOSIS — Z01812 Encounter for preprocedural laboratory examination: Secondary | ICD-10-CM | POA: Insufficient documentation

## 2017-04-13 LAB — BASIC METABOLIC PANEL
Anion gap: 9 (ref 5–15)
BUN: 16 mg/dL (ref 6–20)
CALCIUM: 9.8 mg/dL (ref 8.9–10.3)
CO2: 27 mmol/L (ref 22–32)
CREATININE: 0.82 mg/dL (ref 0.61–1.24)
Chloride: 104 mmol/L (ref 101–111)
GFR calc Af Amer: >60 mL/min (ref >60)
GFR calc non Af Amer: >60 mL/min (ref >60)
Glucose, Bld: 125 mg/dL — ABNORMAL HIGH (ref 65–99)
POTASSIUM: 4.6 mmol/L (ref 3.5–5.1)
SODIUM: 140 mmol/L (ref 135–145)

## 2017-04-13 LAB — HEMOGLOBIN A1C
Hgb A1c MFr Bld: 7.9 % — ABNORMAL HIGH (ref 4.8–5.6)
MEAN PLASMA GLUCOSE: 180.03 mg/dL

## 2017-04-13 LAB — CBC
HEMATOCRIT: 47.5 % (ref 39.0–52.0)
Hemoglobin: 16.8 g/dL (ref 13.0–17.0)
MCH: 31.6 pg (ref 26.0–34.0)
MCHC: 35.4 g/dL (ref 30.0–36.0)
MCV: 89.3 fL (ref 78.0–100.0)
PLATELETS: 233 10*3/uL (ref 150–400)
RBC: 5.32 MIL/uL (ref 4.22–5.81)
RDW: 12.6 % (ref 11.5–15.5)
WBC: 7.8 10*3/uL (ref 4.0–10.5)

## 2017-04-13 LAB — GLUCOSE, CAPILLARY: Glucose-Capillary: 123 mg/dL — ABNORMAL HIGH (ref 65–99)

## 2017-04-13 NOTE — Progress Notes (Signed)
03-23-17 hga1c of 9.0 epic  Clearance Dr Elease Hashimoto 03-23-17 on chart

## 2017-04-13 NOTE — Patient Instructions (Signed)
Tyresse Jayson Zwiefelhofer  04/13/2017   Your procedure is scheduled on: 04-28-17  Report to [facility] Main  Entrance  Follow signs to Short Stay on first floor at 515AM  Call this number if you have problems the morning of surgery [phone]   Remember: ONLY 1 PERSON MAY GO WITH YOU TO SHORT STAY TO GET  READY MORNING OF [place].  Do not eat food or drink liquids :After Midnight.     Take these medicines the morning of surgery with A SIP OF WATER: atorvastatin(lipitor), cetirizine(zyrtec)                                  You may not have any metal on your body including hair pins and              piercings  Do not wear jewelry, make-up, lotions, powders or perfumes, deodorant                     Men may shave face and neck.   Do not bring valuables to the hospital. [place].  Contacts, dentures or bridgework may not be worn into surgery.  Leave suitcase in the car. After surgery it may be brought to your room.                Please read over the following fact sheets you were given: _____________________________________________________________________            How to Manage Your Diabetes Before and After Surgery  Why is it important to control my blood sugar before and after surgery? . Improving blood sugar levels before and after surgery helps healing and can limit problems. . A way of improving blood sugar control is eating a healthy diet by: o  Eating less sugar and carbohydrates o  Increasing activity/exercise o  Talking with your doctor about reaching your blood sugar goals . High blood sugars (greater than 180 mg/dL) can raise your risk of infections and slow your recovery, so you will need to focus on controlling your diabetes during the weeks before surgery. . Make sure that the doctor who takes care of your diabetes knows about your planned surgery including the date and location.  How do I  manage my blood sugar before surgery? . Check your blood sugar at least 4 times a day, starting 2 days before surgery, to make sure that the level is not too high or low. o Check your blood sugar the morning of your surgery when you wake up and every 2 hours until you get to the Short Stay unit. . If your blood sugar is less than 70 mg/dL, you will need to treat for low blood sugar: o Do not take insulin. o Treat a low blood sugar (less than 70 mg/dL) with  cup of clear juice (cranberry or apple), 4 glucose tablets, OR glucose gel. o Recheck blood sugar in 15 minutes after treatment (to make sure it is greater than 70 mg/dL). If your blood sugar is not greater than 70 mg/dL on recheck, call [phone] for further instructions. . Report your blood sugar to the short stay nurse when you get to Short Stay.  . If you are  admitted to the hospital after surgery: o Your blood sugar will be checked by the staff and you will probably be given insulin after surgery (instead of oral diabetes medicines) to make sure you have good blood sugar levels. o The goal for blood sugar control after surgery is 80-180 mg/dL.   WHAT DO I DO ABOUT MY DIABETES MEDICATION?  Please ask your doctor what to do with your JARDIANCE the day before surgery and the morning of your surgery.!!!  . THE NIGHT BEFORE SURGERY, take metformin as normal     . THE MORNING OF SURGERY, DO NOT TAKE METFORMIN   Patient Signature:  Date:   Nurse Signature:  Date:   Reviewed and Endorsed by [facility] Patient Education Committee, August 2015  [facility] - Preparing for Surgery Before surgery, you can play an important role.  Because skin is not sterile, your skin needs to be as free of germs as possible.  You can reduce the number of germs on your skin by washing with CHG (chlorahexidine gluconate) soap before surgery.  CHG is an antiseptic cleaner which kills germs and bonds with the skin to continue killing germs even after  washing. Please DO NOT use if you have an allergy to CHG or antibacterial soaps.  If your skin becomes reddened/irritated stop using the CHG and inform your nurse when you arrive at Short Stay. Do not shave (including legs and underarms) for at least 48 hours prior to the first CHG shower.  You may shave your face/neck. Please follow these instructions carefully:  1.  Shower with CHG Soap the night before surgery and the  morning of Surgery.  2.  If you choose to wash your hair, wash your hair first as usual with your  normal  shampoo.  3.  After you shampoo, rinse your hair and body thoroughly to remove the  shampoo.                           4.  Use CHG as you would any other liquid soap.  You can apply chg directly  to the skin and wash                       Gently with a scrungie or clean washcloth.  5.  Apply the CHG Soap to your body ONLY FROM THE NECK DOWN.   Do not use on face/ open                           Wound or open sores. Avoid contact with eyes, ears mouth and genitals (private parts).                       Wash face,  Genitals (private parts) with your normal soap.             6.  Wash thoroughly, paying special attention to the area where your surgery  will be performed.  7.  Thoroughly rinse your body with warm water from the neck down.  8.  DO NOT shower/wash with your normal soap after using and rinsing off  the CHG Soap.                9.  Pat yourself dry with a clean towel.            10.  Wear clean pajamas.  11.  Place clean sheets on your bed the night of your first shower and do not  sleep with pets. Day of Surgery : Do not apply any lotions/deodorants the morning of surgery.  Please wear clean clothes to the hospital/surgery center.  FAILURE TO FOLLOW THESE INSTRUCTIONS MAY RESULT IN THE CANCELLATION OF YOUR SURGERY PATIENT SIGNATURE_________________________________  NURSE  SIGNATURE__________________________________  ________________________________________________________________________   Adam Phenix  An incentive spirometer is a tool that can help keep your lungs clear and active. This tool measures how well you are filling your lungs with each breath. Taking long deep breaths may help reverse or decrease the chance of developing breathing (pulmonary) problems (especially infection) following:  A long period of time when you are unable to move or be active. BEFORE THE PROCEDURE   If the spirometer includes an indicator to show your [place] effort, your nurse or respiratory therapist will set it to a desired goal.  If possible, sit up straight or lean slightly forward. Try not to slouch.  Hold the incentive spirometer in an upright position. INSTRUCTIONS FOR USE  1. Sit on the edge of your bed if possible, or sit up as far as you can in bed or on a chair. 2. Hold the incentive spirometer in an upright position. 3. Breathe out normally. 4. Place the mouthpiece in your mouth and seal your lips tightly around it. 5. Breathe in slowly and as deeply as possible, raising the piston or the ball toward the top of the column. 6. Hold your breath for 3-5 seconds or for as long as possible. Allow the piston or ball to fall to the bottom of the column. 7. Remove the mouthpiece from your mouth and breathe out normally. 8. Rest for a few seconds and repeat Steps 1 through 7 at least 10 times every 1-2 hours when you are awake. Take your time and take a few normal breaths between deep breaths. 9. The spirometer may include an indicator to show your [place] effort. Use the indicator as a goal to work toward during each repetition. 10. After each set of 10 deep breaths, practice coughing to be sure your lungs are clear. If you have an incision (the cut made at the time of surgery), support your incision when coughing by placing a pillow or rolled up towels firmly  against it. Once you are able to get out of bed, walk around indoors and cough well. You may stop using the incentive spirometer when instructed by your caregiver.  RISKS AND COMPLICATIONS  Take your time so you do not get dizzy or light-headed.  If you are in pain, you may need to take or ask for pain medication before doing incentive spirometry. It is harder to take a deep breath if you are having pain. AFTER USE  Rest and breathe slowly and easily.  It can be helpful to keep track of a log of your progress. Your caregiver can provide you with a simple table to help with this. If you are using the spirometer at home, follow these instructions: [place] IF:   You are having difficultly using the spirometer.  You have trouble using the spirometer as often as instructed.  Your pain medication is not giving enough relief while using the spirometer.  You develop fever of 100.5 F (38.1 C) or higher. SEEK IMMEDIATE MEDICAL CARE IF:   You cough up bloody sputum that had not been present before.  You develop fever of 102 F (38.9 C)  or greater.  You develop worsening pain at or near the incision site. MAKE SURE YOU:   Understand these instructions.  Will watch your condition.  Will get help right away if you are not doing well or get worse. Document Released: 12/01/2006 Document Revised: 10/13/2011 Document Reviewed: 02/01/2007 ExitCare Patient Information 2014 ExitCare, [place].   ________________________________________________________________________  WHAT IS A BLOOD TRANSFUSION? Blood Transfusion Information  A transfusion is the replacement of blood or some of its parts. Blood is made up of multiple cells which provide different functions.  Red blood cells carry oxygen and are used for blood loss replacement.  White blood cells fight against infection.  Platelets control bleeding.  Plasma helps clot blood.  Other blood products are available for  specialized needs, such as hemophilia or other clotting disorders. BEFORE THE TRANSFUSION  Who gives blood for transfusions?   Healthy volunteers who are fully evaluated to make sure their blood is safe. This is blood bank blood. Transfusion therapy is the safest it has ever been in the practice of medicine. Before blood is taken from a donor, a complete history is taken to make sure that person has no history of diseases nor engages in risky social behavior (examples are intravenous drug use or sexual activity with multiple partners). The donor's travel history is screened to minimize risk of transmitting infections, such as malaria. The donated blood is tested for signs of infectious diseases, such as HIV and hepatitis. The blood is then tested to be sure it is compatible with you in order to minimize the chance of a transfusion reaction. If you or a relative donates blood, this is often done in anticipation of surgery and is not appropriate for emergency situations. It takes many days to process the donated blood. RISKS AND COMPLICATIONS Although transfusion therapy is very safe and saves many lives, the main dangers of transfusion include:   Getting an infectious disease.  Developing a transfusion reaction. This is an allergic reaction to something in the blood you were given. Every precaution is taken to prevent this. The decision to have a blood transfusion has been considered carefully by your caregiver before blood is given. Blood is not given unless the benefits outweigh the risks. AFTER THE TRANSFUSION  Right after receiving a blood transfusion, you will usually feel much better and more energetic. This is especially true if your red blood cells have gotten low (anemic). The transfusion raises the level of the red blood cells which carry oxygen, and this usually causes an energy increase.  The nurse administering the transfusion will monitor you carefully for complications. HOME CARE  INSTRUCTIONS  No special instructions are needed after a transfusion. You may find your energy is better. Speak with your caregiver about any limitations on activity for underlying diseases you may have. SEEK MEDICAL CARE IF:   Your condition is not improving after your transfusion.  You develop redness or irritation at the intravenous (IV) site. SEEK IMMEDIATE MEDICAL CARE IF:  Any of the following symptoms occur over the next 12 hours:  Shaking chills.  You have a temperature by mouth above 102 F (38.9 C), not controlled by medicine.  Chest, back, or muscle pain.  People around you feel you are not acting correctly or are confused.  Shortness of breath or difficulty breathing.  Dizziness and fainting.  You get a rash or develop hives.  You have a decrease in urine output.  Your urine turns a dark color or changes to pink, red,  or brown. Any of the following symptoms occur over the next 10 days:  You have a temperature by mouth above 102 F (38.9 C), not controlled by medicine.  Shortness of breath.  Weakness after normal activity.  The white part of the eye turns yellow (jaundice).  You have a decrease in the amount of urine or are urinating less often.  Your urine turns a dark color or changes to pink, red, or brown. Document Released: 07/18/2000 Document Revised: 10/13/2011 Document Reviewed: 03/06/2008 [facility] Patient Information 2014 [place], [place].  _______________________________________________________________________

## 2017-04-14 NOTE — Progress Notes (Signed)
[birth year] Patient's date of birth is-  Your patient has screened as at an elevated risk for Obstructive Sleep Apnea using the STOP-BANG tool during a pre-surgical visit. A score of 4 or greater is an elevated risk.

## 2017-04-16 NOTE — Progress Notes (Signed)
RN spoke with patient over the phone to inform of surgery time change . Patient aware to arrive no later than 1030 am to check in at admitting .

## 2017-04-17 ENCOUNTER — Inpatient Hospital Stay (HOSPITAL_COMMUNITY): Admission: RE | Admit: 2017-04-17 | Payer: BLUE CROSS/BLUE SHIELD | Source: Ambulatory Visit

## 2017-04-18 ENCOUNTER — Other Ambulatory Visit: Payer: Self-pay | Admitting: Family Medicine

## 2017-04-23 ENCOUNTER — Encounter: Payer: Self-pay | Admitting: Family Medicine

## 2017-04-24 DIAGNOSIS — M1712 Unilateral primary osteoarthritis, left knee: Secondary | ICD-10-CM | POA: Diagnosis not present

## 2017-04-24 NOTE — H&P (Addendum)
TOTAL KNEE ADMISSION H&P  Patient is being admitted for left total knee arthroplasty, aesculape.  Subjective:  Chief Complaint:   Left knee primary OA / pain  HPI: [name], 56 y.o. male, has a history of pain and functional disability in the left knee due to arthritis and has failed non-surgical conservative treatments for greater than 12 weeks to include NSAID's and/or analgesics, corticosteriod injections, activity modification and joint aspirations.  Onset of symptoms was gradual, starting >10 years ago with gradually worsening course since that time. The patient noted prior procedures on the knee to include  arthroscopy on the left knee(s).  Patient currently rates pain in the left knee(s) at 9 out of 10 with activity. Patient has night pain, worsening of pain with activity and weight bearing, pain that interferes with activities of daily living, pain with passive range of motion, crepitus and joint swelling.  Patient has evidence of periarticular osteophytes and joint space narrowing by imaging studies.  There is no active infection.  Risks, benefits and expectations were discussed with the patient.  Risks including but not limited to the risk of anesthesia, blood clots, nerve damage, blood vessel damage, failure of the prosthesis, infection and up to and including death.  Patient understand the risks, benefits and expectations and wishes to proceed with surgery.   PCP: [name], MD  D/C Plans:       Home   Post-op Meds:       No Rx given   Tranexamic Acid:      To be given - IV   Decadron:      Is to be given  FYI:     ASA  Tramadol  Dilaudid IV (ok per pt)  DME:   Rx given for - RW and 3-n-1  PT:   OPPT Rx given   Patient Active Problem List   Diagnosis Date Noted  . Situational anxiety 03/17/2016  . Low testosterone 03/13/2015  . Osteoarthritis, multiple sites 08/22/2013  . Obesity (BMI 30-39.9) 05/08/2013  . 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   . Liver nodule 10/07/2012  . History of kidney stones 08/27/2012  . 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 Rotator cuff tear, left 01/23/2011  . Hyperlipidemia 10/11/2010  . BURSITIS, LEFT HIP 05/20/2010  . Controlled type 2 diabetes mellitus without complication ([place]) 18/84/1660  . CHRONIC TENSION TYPE HEADACHE 03/30/2009  . ELEVATED BLOOD PRESSURE 03/30/2009  . SHOULDER PAIN, LEFT 10/20/2008  . CONTUSION OF KNEE 10/20/2008  . ABNORMAL FINDINGS GI TRACT 06/23/2008   Past Medical History:  Diagnosis Date  . Arthritis   . BURSITIS, LEFT HIP 05/20/2010  . Cancer [facility]) 2010   pancreatic cancer  . Chronic kidney disease    stones; 13 stones, currently has 2 stones   . Claustrophobia    "take Lorazepam if I have to fly"  . DIAB W/O COMP TYPE II/UNS NOT STATED UNCNTRL 03/30/2009  . ELEVATED BLOOD PRESSURE 03/30/2009  . Fissure, anal    occ bleeds still  . Headache(784.0)    "only when I was working; had headaches and migraines"  . Hyperlipidemia 10/11/2010  . SHOULDER PAIN, LEFT 10/20/2008    Past Surgical History:  Procedure Laterality Date  . ANAL FISSURE REPAIR  07/28/2011   Procedure: ANAL FISSURE REPAIR;  Surgeon: Adin Hector, MD;  Location: [facility];  Service: General;  Laterality: N/A;  Repair Anal Fissure/sphinterotomy and excision rectal polypl  . BACK SURGERY  2000   "had 2 cages  put in"  . BIOPSY PANCREAS  2010   benign  . HERNIA REPAIR  1478   umbilical  . KIDNEY STONES  2010 & 2008 & 2005  . LAPAROSCOPIC INCISIONAL / UMBILICAL / VENTRAL HERNIA REPAIR  07/08/11   [facility]  . left rotator cuff  02/26/2011  . left shoulder bone spurs  2011  . LIPOMA EXCISION     "several; off back, arms"  . pancreatic tumor  2010  . right rotator cuff     2011  . TONSILLECTOMY  1967  . VASECTOMY  1994  . VENTRAL HERNIA REPAIR  07/08/2011   Procedure: LAPAROSCOPIC VENTRAL HERNIA;  Surgeon: Adin Hector, MD;  Location: [facility];  Service: General;  Laterality: N/A;  Laparoscopic repair  ventral hernia with mesh, Lysis of adhesions.    No prescriptions prior to admission.   Allergies  Allergen Reactions  . Merthiolate [Thimerosol]     Unknown reaction  . Morphine     REACTION: paronia    Social History  Substance Use Topics  . Smoking status: Never Smoker  . Smokeless tobacco: Never Used  . Alcohol use No     Comment: 07/08/11 "maybe 2-3 times a year"    Family History  Problem Relation Age of Onset  . Diabetes Brother   . Stroke Father   . Heart disease Father   . Heart attack Father 37  . Cancer Maternal Grandfather        colon  . Colon cancer Maternal Grandfather 70     Review of Systems  Constitutional: Negative.   HENT: Negative.   Eyes: Negative.   Respiratory: Negative.   Cardiovascular: Negative.   Gastrointestinal: Positive for heartburn.  Genitourinary: Negative.   Musculoskeletal: Positive for joint pain.  Skin: Negative.   Neurological: Negative.   Endo/Heme/Allergies: Positive for environmental allergies.  Psychiatric/Behavioral: Negative.     Objective:  Physical Exam  Constitutional: He is oriented to person, place, and time. He appears well-developed.  HENT:  Head: Normocephalic.  Eyes: Pupils are equal, round, and reactive to light.  Neck: Neck supple. No JVD present. No tracheal deviation present. No thyromegaly present.  Cardiovascular: Normal rate, regular rhythm and intact distal pulses.   Respiratory: Effort normal and breath sounds normal. No respiratory distress. He has no wheezes.  GI: Soft. There is no tenderness. There is no guarding.  Musculoskeletal:       Left knee: He exhibits decreased range of motion, swelling and bony tenderness. He exhibits no ecchymosis, no deformity, no laceration and no erythema. Tenderness found.  Lymphadenopathy:    He has no cervical adenopathy.  Neurological: He is alert and oriented to person, place, and time. A sensory deficit (occasional tingling left foot associated with knee  pain) is present.  Skin: Skin is warm and dry.  Psychiatric: He has a normal mood and affect.      Labs:  Estimated body mass index is 38.39 kg/m as calculated from the following:   Height as of 04/13/17: 6\' 1"  (1.854 m).   Weight as of 04/13/17: 132 kg (291 lb).   Imaging Review Plain radiographs demonstrate severe degenerative joint disease of the left knee(s).  The bone quality appears to be good for age and reported activity level.  Assessment/Plan:  End stage arthritis, left knee   The patient history, physical examination, clinical judgment of the provider and imaging studies are consistent with end stage degenerative joint disease of the left knee(s) and total knee arthroplasty is  deemed medically necessary. The treatment options including medical management, injection therapy arthroscopy and arthroplasty were discussed at length. The risks and benefits of total knee arthroplasty were presented and reviewed. The risks due to aseptic loosening, infection, stiffness, patella tracking problems, thromboembolic complications and other imponderables were discussed. The patient acknowledged the explanation, agreed to proceed with the plan and consent was signed. Patient is being admitted for inpatient treatment for surgery, pain control, PT, OT, prophylactic antibiotics, VTE prophylaxis, progressive ambulation and ADL's and discharge planning. The patient is planning to be discharged home.       [place] Pugh Miamor Ayler   PA-C  04/24/2017, 10:21 AM

## 2017-04-28 ENCOUNTER — Ambulatory Visit (HOSPITAL_COMMUNITY)
Admission: RE | Admit: 2017-04-28 | Payer: BLUE CROSS/BLUE SHIELD | Source: Ambulatory Visit | Admitting: Orthopedic Surgery

## 2017-04-28 ENCOUNTER — Encounter (HOSPITAL_COMMUNITY)
Admission: RE | Disposition: A | Discharge status: Home / Self Care | Payer: Self-pay | Source: Ambulatory Visit | Attending: Orthopedic Surgery

## 2017-04-28 ENCOUNTER — Encounter (HOSPITAL_COMMUNITY): Payer: Self-pay | Admitting: *Deleted

## 2017-04-28 ENCOUNTER — Ambulatory Visit (HOSPITAL_COMMUNITY): Payer: BLUE CROSS/BLUE SHIELD | Admitting: Certified Registered Nurse Anesthetist

## 2017-04-28 ENCOUNTER — Observation Stay (HOSPITAL_COMMUNITY)
Admission: RE | Admit: 2017-04-28 | Discharge: 2017-04-29 | Disposition: A | Discharge status: Home / Self Care | Payer: BLUE CROSS/BLUE SHIELD | Source: Ambulatory Visit | Attending: Orthopedic Surgery | Admitting: Orthopedic Surgery

## 2017-04-28 ENCOUNTER — Encounter (HOSPITAL_COMMUNITY): Admission: RE | Payer: Self-pay | Source: Ambulatory Visit

## 2017-04-28 DIAGNOSIS — M75102 Unspecified rotator cuff tear or rupture of left shoulder, not specified as traumatic: Secondary | ICD-10-CM | POA: Diagnosis not present

## 2017-04-28 DIAGNOSIS — F418 Other specified anxiety disorders: Secondary | ICD-10-CM | POA: Diagnosis not present

## 2017-04-28 DIAGNOSIS — E1122 Type 2 diabetes mellitus with diabetic chronic kidney disease: Secondary | ICD-10-CM | POA: Diagnosis not present

## 2017-04-28 DIAGNOSIS — E785 Hyperlipidemia, unspecified: Secondary | ICD-10-CM | POA: Diagnosis not present

## 2017-04-28 DIAGNOSIS — N189 Chronic kidney disease, unspecified: Secondary | ICD-10-CM | POA: Insufficient documentation

## 2017-04-28 DIAGNOSIS — Z791 Long term (current) use of non-steroidal anti-inflammatories (NSAID): Secondary | ICD-10-CM | POA: Insufficient documentation

## 2017-04-28 DIAGNOSIS — Z6830 Body mass index (BMI) 30.0-30.9, adult: Secondary | ICD-10-CM | POA: Insufficient documentation

## 2017-04-28 DIAGNOSIS — Z96652 Presence of left artificial knee joint: Secondary | ICD-10-CM

## 2017-04-28 DIAGNOSIS — E119 Type 2 diabetes mellitus without complications: Secondary | ICD-10-CM | POA: Diagnosis not present

## 2017-04-28 DIAGNOSIS — E669 Obesity, unspecified: Secondary | ICD-10-CM | POA: Diagnosis not present

## 2017-04-28 DIAGNOSIS — Z7982 Long term (current) use of aspirin: Secondary | ICD-10-CM | POA: Insufficient documentation

## 2017-04-28 DIAGNOSIS — Z79899 Other long term (current) drug therapy: Secondary | ICD-10-CM | POA: Insufficient documentation

## 2017-04-28 DIAGNOSIS — Z8507 Personal history of malignant neoplasm of pancreas: Secondary | ICD-10-CM | POA: Diagnosis not present

## 2017-04-28 DIAGNOSIS — M1712 Unilateral primary osteoarthritis, left knee: Principal | ICD-10-CM | POA: Insufficient documentation

## 2017-04-28 DIAGNOSIS — Z96659 Presence of unspecified artificial knee joint: Secondary | ICD-10-CM

## 2017-04-28 DIAGNOSIS — Z888 Allergy status to other drugs, medicaments and biological substances status: Secondary | ICD-10-CM | POA: Diagnosis not present

## 2017-04-28 DIAGNOSIS — G8918 Other acute postprocedural pain: Secondary | ICD-10-CM | POA: Diagnosis not present

## 2017-04-28 HISTORY — PX: TOTAL KNEE ARTHROPLASTY: SHX125

## 2017-04-28 LAB — TYPE AND SCREEN
ABO/RH(D): A POS
Antibody Screen: NEGATIVE

## 2017-04-28 LAB — GLUCOSE, CAPILLARY
GLUCOSE-CAPILLARY: 187 mg/dL — AB (ref 65–99)
Glucose-Capillary: 138 mg/dL — ABNORMAL HIGH (ref 65–99)
Glucose-Capillary: 124 mg/dL — ABNORMAL HIGH (ref 65–99)
Glucose-Capillary: 211 mg/dL — ABNORMAL HIGH (ref 65–99)

## 2017-04-28 SURGERY — ARTHROPLASTY, KNEE, TOTAL
Anesthesia: Spinal | Laterality: Left

## 2017-04-28 SURGERY — ARTHROPLASTY, KNEE, TOTAL
Anesthesia: Spinal | Site: Knee | Laterality: Left

## 2017-04-28 MED ORDER — FENTANYL CITRATE (PF) 100 MCG/2ML IJ SOLN
INTRAMUSCULAR | Status: AC
  Filled 2017-04-28: qty 2

## 2017-04-28 MED ORDER — BUPIVACAINE-EPINEPHRINE (PF) 0.25% -1:200000 IJ SOLN
INTRAMUSCULAR | Status: DC | PRN
  Administered 2017-04-28: 30 mL

## 2017-04-28 MED ORDER — DEXAMETHASONE SODIUM PHOSPHATE 10 MG/ML IJ SOLN
10.0000 mg | Freq: Once | INTRAMUSCULAR | Status: AC
  Administered 2017-04-29: 10 mg via INTRAVENOUS

## 2017-04-28 MED ORDER — TRANEXAMIC ACID 1000 MG/10ML IV SOLN
1000.0000 mg | Freq: Once | INTRAVENOUS | Status: AC
  Administered 2017-04-28: 1000 mg via INTRAVENOUS
  Filled 2017-04-28: qty 1100

## 2017-04-28 MED ORDER — ACETAMINOPHEN 500 MG PO TABS: 1000.0000 mg | ORAL_TABLET | Freq: Three times a day (TID) | ORAL | 0 refills | Status: DC

## 2017-04-28 MED ORDER — PROMETHAZINE HCL 25 MG/ML IJ SOLN: 6.2500 mg | INTRAMUSCULAR | Status: DC | PRN

## 2017-04-28 MED ORDER — MENTHOL 3 MG MT LOZG: 1.0000 | LOZENGE | OROMUCOSAL | Status: DC | PRN

## 2017-04-28 MED ORDER — METOCLOPRAMIDE HCL 5 MG/ML IJ SOLN: 5.0000 mg | Freq: Three times a day (TID) | INTRAMUSCULAR | Status: DC | PRN

## 2017-04-28 MED ORDER — MIDAZOLAM HCL 5 MG/5ML IJ SOLN
INTRAMUSCULAR | Status: DC | PRN
  Administered 2017-04-28: 1 mg via INTRAVENOUS

## 2017-04-28 MED ORDER — DOCUSATE SODIUM 100 MG PO CAPS: 100.0000 mg | ORAL_CAPSULE | Freq: Two times a day (BID) | ORAL | 0 refills | Status: DC

## 2017-04-28 MED ORDER — MIDAZOLAM HCL 5 MG/5ML IJ SOLN: INTRAMUSCULAR | Status: DC | PRN

## 2017-04-28 MED ORDER — CEFAZOLIN SODIUM-DEXTROSE 2-4 GM/100ML-% IV SOLN
INTRAVENOUS | Status: AC
  Filled 2017-04-28: qty 100

## 2017-04-28 MED ORDER — MIDAZOLAM HCL 2 MG/2ML IJ SOLN
INTRAMUSCULAR | Status: AC
  Filled 2017-04-28: qty 2

## 2017-04-28 MED ORDER — KETOROLAC TROMETHAMINE 30 MG/ML IJ SOLN
INTRAMUSCULAR | Status: DC | PRN
Start: 2017-04-28 — End: 2017-04-28
  Administered 2017-04-28: 30 mg

## 2017-04-28 MED ORDER — DIPHENHYDRAMINE HCL 25 MG PO CAPS
25.0000 mg | ORAL_CAPSULE | Freq: Four times a day (QID) | ORAL | Status: DC | PRN
  Administered 2017-04-28: 25 mg via ORAL
  Filled 2017-04-28: qty 1

## 2017-04-28 MED ORDER — KETOROLAC TROMETHAMINE 30 MG/ML IJ SOLN
INTRAMUSCULAR | Status: AC
Start: 2017-04-28 — End: 2017-04-28
  Filled 2017-04-28: qty 1

## 2017-04-28 MED ORDER — BUPIVACAINE-EPINEPHRINE (PF) 0.25% -1:200000 IJ SOLN
INTRAMUSCULAR | Status: AC
  Filled 2017-04-28: qty 30

## 2017-04-28 MED ORDER — LORATADINE 10 MG PO TABS
10.0000 mg | ORAL_TABLET | Freq: Every day | ORAL | Status: DC
  Administered 2017-04-29: 10 mg via ORAL
  Filled 2017-04-28: qty 1

## 2017-04-28 MED ORDER — SODIUM CHLORIDE 0.9 % IV SOLN
INTRAVENOUS | Status: DC
  Administered 2017-04-28 – 2017-04-29 (×2): via INTRAVENOUS

## 2017-04-28 MED ORDER — FENTANYL CITRATE (PF) 100 MCG/2ML IJ SOLN
INTRAMUSCULAR | Status: DC | PRN
  Administered 2017-04-28: 50 ug via INTRAVENOUS

## 2017-04-28 MED ORDER — ONDANSETRON HCL 4 MG/2ML IJ SOLN: 4.0000 mg | Freq: Four times a day (QID) | INTRAMUSCULAR | Status: DC | PRN

## 2017-04-28 MED ORDER — FENTANYL CITRATE (PF) 100 MCG/2ML IJ SOLN
INTRAMUSCULAR | Status: AC
  Administered 2017-04-28: 50 ug via INTRAVENOUS
  Filled 2017-04-28: qty 2

## 2017-04-28 MED ORDER — ASPIRIN 81 MG PO CHEW: 81.0000 mg | CHEWABLE_TABLET | Freq: Two times a day (BID) | ORAL | 0 refills | Status: DC

## 2017-04-28 MED ORDER — BUPIVACAINE-EPINEPHRINE (PF) 0.5% -1:200000 IJ SOLN
INTRAMUSCULAR | Status: DC | PRN
  Administered 2017-04-28: 30 mL via PERINEURAL

## 2017-04-28 MED ORDER — ASPIRIN 81 MG PO CHEW
81.0000 mg | CHEWABLE_TABLET | Freq: Two times a day (BID) | ORAL | Status: DC
  Administered 2017-04-28 – 2017-04-29 (×2): 81 mg via ORAL
  Filled 2017-04-28 (×2): qty 1

## 2017-04-28 MED ORDER — PROPOFOL 10 MG/ML IV BOLUS
INTRAVENOUS | Status: AC
  Filled 2017-04-28: qty 20

## 2017-04-28 MED ORDER — PHENOL 1.4 % MT LIQD: 1.0000 | OROMUCOSAL | Status: DC | PRN

## 2017-04-28 MED ORDER — DEXAMETHASONE SODIUM PHOSPHATE 10 MG/ML IJ SOLN
INTRAMUSCULAR | Status: AC
  Filled 2017-04-28: qty 1

## 2017-04-28 MED ORDER — ONDANSETRON HCL 4 MG/2ML IJ SOLN
INTRAMUSCULAR | Status: AC
  Filled 2017-04-28: qty 2

## 2017-04-28 MED ORDER — ACETAMINOPHEN 500 MG PO TABS
1000.0000 mg | ORAL_TABLET | Freq: Three times a day (TID) | ORAL | Status: DC
  Administered 2017-04-28 – 2017-04-29 (×3): 1000 mg via ORAL
  Filled 2017-04-28 (×3): qty 2

## 2017-04-28 MED ORDER — METHOCARBAMOL 500 MG PO TABS
500.0000 mg | ORAL_TABLET | Freq: Four times a day (QID) | ORAL | Status: DC | PRN
  Administered 2017-04-28 – 2017-04-29 (×3): 500 mg via ORAL
  Filled 2017-04-28 (×3): qty 1

## 2017-04-28 MED ORDER — CELECOXIB 200 MG PO CAPS
200.0000 mg | ORAL_CAPSULE | Freq: Two times a day (BID) | ORAL | Status: DC
  Administered 2017-04-28 – 2017-04-29 (×2): 200 mg via ORAL
  Filled 2017-04-28 (×2): qty 1

## 2017-04-28 MED ORDER — SODIUM CHLORIDE 0.9 % IJ SOLN
INTRAMUSCULAR | Status: DC | PRN
Start: 2017-04-28 — End: 2017-04-28
  Administered 2017-04-28: 50 mL via INTRAVENOUS

## 2017-04-28 MED ORDER — MAGNESIUM CITRATE PO SOLN: 1.0000 | Freq: Once | ORAL | Status: DC | PRN

## 2017-04-28 MED ORDER — INSULIN ASPART 100 UNIT/ML ~~LOC~~ SOLN
0.0000 [IU] | Freq: Three times a day (TID) | SUBCUTANEOUS | Status: DC
  Administered 2017-04-28: 3 [IU] via SUBCUTANEOUS

## 2017-04-28 MED ORDER — MIDAZOLAM HCL 2 MG/2ML IJ SOLN
2.0000 mg | Freq: Once | INTRAMUSCULAR | Status: AC
  Administered 2017-04-28: 2 mg via INTRAVENOUS

## 2017-04-28 MED ORDER — ONDANSETRON HCL 4 MG PO TABS: 4.0000 mg | ORAL_TABLET | Freq: Four times a day (QID) | ORAL | Status: DC | PRN

## 2017-04-28 MED ORDER — CANAGLIFLOZIN 100 MG PO TABS
100.0000 mg | ORAL_TABLET | Freq: Every day | ORAL | Status: DC
  Administered 2017-04-29: 100 mg via ORAL
  Filled 2017-04-28: qty 1

## 2017-04-28 MED ORDER — LORAZEPAM 1 MG PO TABS
1.0000 mg | ORAL_TABLET | Freq: Four times a day (QID) | ORAL | Status: DC | PRN
  Administered 2017-04-28: 1 mg via ORAL
  Filled 2017-04-28: qty 1

## 2017-04-28 MED ORDER — INSULIN ASPART 100 UNIT/ML ~~LOC~~ SOLN: 0.0000 [IU] | Freq: Three times a day (TID) | SUBCUTANEOUS | Status: DC

## 2017-04-28 MED ORDER — PROPOFOL 10 MG/ML IV BOLUS
INTRAVENOUS | Status: AC
  Filled 2017-04-28: qty 60

## 2017-04-28 MED ORDER — POLYETHYLENE GLYCOL 3350 17 G PO PACK: 17.0000 g | PACK | Freq: Two times a day (BID) | ORAL | Status: DC

## 2017-04-28 MED ORDER — METHOCARBAMOL 1000 MG/10ML IJ SOLN
500.0000 mg | Freq: Four times a day (QID) | INTRAVENOUS | Status: DC | PRN
  Administered 2017-04-28: 500 mg via INTRAVENOUS
  Filled 2017-04-28: qty 550

## 2017-04-28 MED ORDER — CEFAZOLIN SODIUM-DEXTROSE 2-4 GM/100ML-% IV SOLN
2.0000 g | Freq: Four times a day (QID) | INTRAVENOUS | Status: AC
  Administered 2017-04-28 – 2017-04-29 (×2): 2 g via INTRAVENOUS
  Filled 2017-04-28 (×2): qty 100

## 2017-04-28 MED ORDER — LACTATED RINGERS IV SOLN
INTRAVENOUS | Status: DC
Start: 2017-04-28 — End: 2017-04-28
  Administered 2017-04-28 (×3): via INTRAVENOUS

## 2017-04-28 MED ORDER — ALUM & MAG HYDROXIDE-SIMETH 200-200-20 MG/5ML PO SUSP: 15.0000 mL | ORAL | Status: DC | PRN

## 2017-04-28 MED ORDER — FENTANYL CITRATE (PF) 100 MCG/2ML IJ SOLN
100.0000 ug | Freq: Once | INTRAMUSCULAR | Status: AC
  Administered 2017-04-28: 50 ug via INTRAVENOUS

## 2017-04-28 MED ORDER — TRAMADOL HCL 50 MG PO TABS: 50.0000 mg | ORAL_TABLET | Freq: Four times a day (QID) | ORAL | 0 refills | Status: DC | PRN

## 2017-04-28 MED ORDER — POLYETHYLENE GLYCOL 3350 17 G PO PACK: 17.0000 g | PACK | Freq: Two times a day (BID) | ORAL | 0 refills | Status: DC

## 2017-04-28 MED ORDER — FENTANYL CITRATE (PF) 100 MCG/2ML IJ SOLN: 25.0000 ug | INTRAMUSCULAR | Status: DC | PRN

## 2017-04-28 MED ORDER — PROPOFOL 500 MG/50ML IV EMUL
INTRAVENOUS | Status: DC | PRN
  Administered 2017-04-28: 50 ug/kg/min via INTRAVENOUS

## 2017-04-28 MED ORDER — POVIDONE-IODINE 10 % EX SWAB
2.0000 "application " | Freq: Once | CUTANEOUS | Status: AC
  Administered 2017-04-28: 2 via TOPICAL

## 2017-04-28 MED ORDER — METFORMIN HCL ER 500 MG PO TB24
1000.0000 mg | ORAL_TABLET | Freq: Every day | ORAL | Status: DC
  Administered 2017-04-29: 1000 mg via ORAL
  Filled 2017-04-28: qty 2

## 2017-04-28 MED ORDER — TRANEXAMIC ACID 1000 MG/10ML IV SOLN
1000.0000 mg | INTRAVENOUS | Status: AC
  Administered 2017-04-28: 1000 mg via INTRAVENOUS
  Filled 2017-04-28: qty 1100

## 2017-04-28 MED ORDER — BISACODYL 10 MG RE SUPP: 10.0000 mg | Freq: Every day | RECTAL | Status: DC | PRN

## 2017-04-28 MED ORDER — ATORVASTATIN CALCIUM 20 MG PO TABS
20.0000 mg | ORAL_TABLET | Freq: Every day | ORAL | Status: DC
  Administered 2017-04-29: 20 mg via ORAL
  Filled 2017-04-28: qty 1

## 2017-04-28 MED ORDER — HYDROMORPHONE HCL-NACL 0.5-0.9 MG/ML-% IV SOSY
0.5000 mg | PREFILLED_SYRINGE | INTRAVENOUS | Status: DC | PRN
  Administered 2017-04-28: 0.5 mg via INTRAVENOUS
  Administered 2017-04-28: 1 mg via INTRAVENOUS
  Administered 2017-04-28: 1.5 mg via INTRAVENOUS
  Administered 2017-04-29: 1 mg via INTRAVENOUS
  Filled 2017-04-28: qty 1
  Filled 2017-04-28 (×2): qty 2
  Filled 2017-04-28: qty 3

## 2017-04-28 MED ORDER — DEXAMETHASONE SODIUM PHOSPHATE 10 MG/ML IJ SOLN
INTRAMUSCULAR | Status: DC | PRN
  Administered 2017-04-28: 10 mg via INTRAVENOUS

## 2017-04-28 MED ORDER — TRAMADOL HCL 50 MG PO TABS
50.0000 mg | ORAL_TABLET | Freq: Four times a day (QID) | ORAL | Status: DC
  Administered 2017-04-28: 100 mg via ORAL
  Administered 2017-04-28: 50 mg via ORAL
  Administered 2017-04-29: 100 mg via ORAL
  Filled 2017-04-28: qty 1
  Filled 2017-04-28 (×2): qty 2

## 2017-04-28 MED ORDER — DOCUSATE SODIUM 100 MG PO CAPS
100.0000 mg | ORAL_CAPSULE | Freq: Two times a day (BID) | ORAL | Status: DC
  Administered 2017-04-28 – 2017-04-29 (×2): 100 mg via ORAL
  Filled 2017-04-28 (×2): qty 1

## 2017-04-28 MED ORDER — PROPOFOL 10 MG/ML IV BOLUS
INTRAVENOUS | Status: DC | PRN
  Administered 2017-04-28: 50 mg via INTRAVENOUS

## 2017-04-28 MED ORDER — FERROUS SULFATE 325 (65 FE) MG PO TABS
325.0000 mg | ORAL_TABLET | Freq: Three times a day (TID) | ORAL | Status: DC
  Administered 2017-04-29: 325 mg via ORAL
  Filled 2017-04-28: qty 1

## 2017-04-28 MED ORDER — METOCLOPRAMIDE HCL 5 MG PO TABS: 5.0000 mg | ORAL_TABLET | Freq: Three times a day (TID) | ORAL | Status: DC | PRN

## 2017-04-28 MED ORDER — CEFAZOLIN SODIUM-DEXTROSE 2-4 GM/100ML-% IV SOLN
2.0000 g | INTRAVENOUS | Status: AC
  Administered 2017-04-28: 2 g via INTRAVENOUS
  Administered 2017-04-28: 1 g via INTRAVENOUS

## 2017-04-28 MED ORDER — SODIUM CHLORIDE 0.9 % IJ SOLN
INTRAMUSCULAR | Status: AC
  Filled 2017-04-28: qty 50

## 2017-04-28 MED ORDER — CEFAZOLIN SODIUM-DEXTROSE 2-3 GM-% IV SOLR: INTRAVENOUS | Status: DC | PRN

## 2017-04-28 MED ORDER — ONDANSETRON HCL 4 MG/2ML IJ SOLN
INTRAMUSCULAR | Status: DC | PRN
  Administered 2017-04-28: 4 mg via INTRAVENOUS

## 2017-04-28 MED ORDER — FERROUS SULFATE 325 (65 FE) MG PO TABS: 325.0000 mg | ORAL_TABLET | Freq: Three times a day (TID) | ORAL | 3 refills | Status: DC

## 2017-04-28 MED ORDER — BUPIVACAINE IN DEXTROSE 0.75-8.25 % IT SOLN
INTRATHECAL | Status: DC | PRN
  Administered 2017-04-28: 1.8 mL via INTRATHECAL

## 2017-04-28 SURGICAL SUPPLY — 46 items
BAG DECANTER FOR FLEXI CONT (MISCELLANEOUS) IMPLANT
BAG ZIPLOCK 12X15 (MISCELLANEOUS) IMPLANT
BANDAGE ACE 6X5 VEL STRL LF (GAUZE/BANDAGES/DRESSINGS) ×2 IMPLANT
BLADE SAW SGTL 11.0X1.19X90.0M (BLADE) IMPLANT
BLADE SAW SGTL 13.0X1.19X90.0M (BLADE) ×2 IMPLANT
BONE CEMENT GENTAMICIN (Cement) ×4 IMPLANT
BOWL SMART MIX CTS (DISPOSABLE) ×2 IMPLANT
CAP KNEE TOTAL 3 ×2 IMPLANT
CEMENT BONE GENTAMICIN 40 (Cement) ×2 IMPLANT
COVER SURGICAL LIGHT HANDLE (MISCELLANEOUS) ×2 IMPLANT
CUFF TOURN SGL QUICK 34 (TOURNIQUET CUFF) ×1
CUFF TRNQT CYL 34X4X40X1 (TOURNIQUET CUFF) ×1 IMPLANT
DECANTER SPIKE VIAL GLASS SM (MISCELLANEOUS) ×2 IMPLANT
DERMABOND ADVANCED (GAUZE/BANDAGES/DRESSINGS) ×1
DERMABOND ADVANCED .7 DNX12 (GAUZE/BANDAGES/DRESSINGS) ×1 IMPLANT
DRAPE U-SHAPE 47X51 STRL (DRAPES) ×2 IMPLANT
DRESSING AQUACEL AG SP 3.5X10 (GAUZE/BANDAGES/DRESSINGS) ×1 IMPLANT
DRSG AQUACEL AG SP 3.5X10 (GAUZE/BANDAGES/DRESSINGS) ×2
DURAPREP 26ML APPLICATOR (WOUND CARE) ×4 IMPLANT
ELECT REM PT RETURN 15FT ADLT (MISCELLANEOUS) ×2 IMPLANT
GLOVE BIOGEL PI IND STRL 7.5 (GLOVE) ×1 IMPLANT
GLOVE BIOGEL PI IND STRL 8.5 (GLOVE) ×1 IMPLANT
GLOVE BIOGEL PI INDICATOR 7.5 (GLOVE) ×1
GLOVE BIOGEL PI INDICATOR 8.5 (GLOVE) ×1
GLOVE ECLIPSE 8.0 STRL XLNG CF (GLOVE) ×2 IMPLANT
GLOVE ORTHO TXT STRL SZ7.5 (GLOVE) ×4 IMPLANT
GOWN STRL REUS W/TWL LRG LVL3 (GOWN DISPOSABLE) ×2 IMPLANT
GOWN STRL REUS W/TWL XL LVL3 (GOWN DISPOSABLE) ×6 IMPLANT
HANDPIECE INTERPULSE COAX TIP (DISPOSABLE) ×1
MANIFOLD NEPTUNE II (INSTRUMENTS) ×2 IMPLANT
PACK TOTAL KNEE CUSTOM (KITS) ×2 IMPLANT
POSITIONER SURGICAL ARM (MISCELLANEOUS) ×2 IMPLANT
SET HNDPC FAN SPRY TIP SCT (DISPOSABLE) ×1 IMPLANT
SET PAD KNEE POSITIONER (MISCELLANEOUS) ×2 IMPLANT
STEM TIBIA CEMENTED 52X14 VEGA (Stem) ×1 IMPLANT
SUT MNCRL AB 4-0 PS2 18 (SUTURE) ×2 IMPLANT
SUT STRATAFIX 0 PDS 27 VIOLET (SUTURE) ×2
SUT VIC AB 1 CT1 36 (SUTURE) ×2 IMPLANT
SUT VIC AB 2-0 CT1 27 (SUTURE) ×3
SUT VIC AB 2-0 CT1 TAPERPNT 27 (SUTURE) ×3 IMPLANT
SUTURE STRATFX 0 PDS 27 VIOLET (SUTURE) ×1 IMPLANT
SYR 50ML LL SCALE MARK (SYRINGE) ×2 IMPLANT
TIBIA STEM CEMENTED 52X14 VEGA (Stem) ×2 IMPLANT
TRAY FOLEY W/METER SILVER 16FR (SET/KITS/TRAYS/PACK) ×2 IMPLANT
WRAP KNEE MAXI GEL POST OP (GAUZE/BANDAGES/DRESSINGS) ×2 IMPLANT
YANKAUER SUCT BULB TIP 10FT TU (MISCELLANEOUS) ×2 IMPLANT

## 2017-04-28 NOTE — Interval H&P Note (Signed)
History and Physical Interval Note:  04/28/2017 11:46 AM  [name]  has presented today for surgery, with the diagnosis of LEFT KNEE OA  The various methods of treatment have been discussed with the patient and family. After consideration of risks, benefits and other options for treatment, the patient has consented to  Procedure(s): LEFT TOTAL KNEE ARTHROPLASTY (Left) as a surgical intervention .  [name] history has been reviewed, patient examined, no change in status, stable for surgery.  I have reviewed the patient's chart and labs.  Questions were answered to the patient's satisfaction.     Mauri Pole

## 2017-04-28 NOTE — Anesthesia Postprocedure Evaluation (Signed)
Anesthesia Post Note  Patient: [name]  Procedure(s) Performed: Procedure(s) (LRB): LEFT TOTAL KNEE ARTHROPLASTY (Left)     Patient location during evaluation: PACU Anesthesia Type: Spinal Level of consciousness: awake and alert Pain management: pain level controlled Vital Signs Assessment: post-procedure vital signs reviewed and stable Respiratory status: spontaneous breathing and respiratory function stable Cardiovascular status: blood pressure returned to baseline and stable Postop Assessment: spinal receding and no apparent nausea or vomiting Anesthetic complications: no    Last Vitals:  Vitals:   04/28/17 1545 04/28/17 1600  BP: 117/62 118/74  Pulse: (!) 54 67  Resp: 10 13  Temp:    SpO2: 96% 96%    Last Pain:  Vitals:   04/28/17 1105  PainSc: [name]

## 2017-04-28 NOTE — Progress Notes (Signed)
AssistedDr. Brock with left, ultrasound guided, adductor canal block. Side rails up, monitors on throughout procedure. See vital signs in flow sheet. Tolerated Procedure well.  

## 2017-04-28 NOTE — Transfer of Care (Addendum)
Immediate Anesthesia Transfer of Care Note  Patient: [name]  Procedure(s) Performed: Procedure(s): LEFT TOTAL KNEE ARTHROPLASTY (Left)  Patient Location: PACU  Anesthesia Type:Spinal  Level of Consciousness:  alert, patient cooperative and responds to stimulation  Airway & Oxygen Therapy:Patient Spontanous Breathing and Patient connected to face mask oxgen  Post-op Assessment:  Report given to PACU RN and Post -op Vital signs reviewed and stable  Post vital signs:  Reviewed and stable  Last Vitals:  Vitals:   04/28/17 1300 04/28/17 1519  BP:  113/63  Pulse: 75   Resp: 15   SpO2: 32%     Complications: No apparent anesthesia complications

## 2017-04-28 NOTE — Op Note (Signed)
NAME:  JAMARIUS SAHA                      MEDICAL RECORD NO.:  [id number]                             FACILITY:  [facility]      PHYSICIAN:  Pietro Cassis. Alvan Dame, M.D.  DATE OF BIRTH:  1961-07-02      DATE OF PROCEDURE:  04/28/2017                                     OPERATIVE REPORT         PREOPERATIVE DIAGNOSIS:  Left knee osteoarthritis.      POSTOPERATIVE DIAGNOSIS:  Left knee osteoarthritis.      FINDINGS:  The patient was noted to have complete loss of cartilage and   bone-on-bone arthritis with associated osteophytes in the medial and patellofemoral compartments of   the knee with a significant synovitis and associated effusion.      PROCEDURE:  Left total knee replacement.      COMPONENTS USED:  Aesculap fixed bearing posterior stabilized knee   system, a size 7 left femur, 4+ tibia with a 14X21mm stem, size 10 mm PS insert, and size 5 patellar   button.      SURGEON:  Pietro Cassis. Alvan Dame, M.D.      ASSISTANT:  Danae Orleans, PA-C.      ANESTHESIA:  Regional and Spinal.      SPECIMENS:  None.      COMPLICATION:  None.      DRAINS:  None.  EBL: <100c      TOURNIQUET TIME:   per anesthesia record      The patient was stable to the recovery room.      INDICATION FOR PROCEDURE:  KINGSTON SHAWGO is a 56 y.o. male patient of   mine.  The patient had been seen, evaluated, and treated conservatively in the   office with medication, activity modification, and injections.  The patient had   radiographic changes of bone-on-bone arthritis with endplate sclerosis and osteophytes noted.      The patient failed conservative measures including medication, injections, and activity modification, and at this point was ready for more definitive measures.   Based on the radiographic changes and failed conservative measures, the patient   decided to proceed with total knee replacement.  Risks of infection,   DVT, component failure, need for revision surgery, postop course, and   expectations  were all   discussed and reviewed.  Consent was obtained for benefit of pain   relief.      PROCEDURE IN DETAIL:  The patient was brought to the operative theater.   Once adequate anesthesia, preoperative antibiotics, 2 gm of Ancef, 1 gm of Tranexamic Acid, and 10 mg of Decadron administered, the patient was positioned supine with the left thigh tourniquet placed.  The  left lower extremity was prepped and draped in sterile fashion.  A time-   out was performed identifying the patient, planned procedure, and   extremity.      The left lower extremity was placed in the [facility] leg holder.  The leg was   exsanguinated, tourniquet elevated to 250 mmHg.  A midline incision was   made followed by median parapatellar arthrotomy.  Following initial  exposure, attention was first directed to the patella.  Precut   measurement was noted to be 26 mm.  I resected down to 14-15 mm and used a   size 5 patellar button to restore patellar height as well as cover the cut   surface.      The lug holes were drilled and a metal shim was placed to protect the   patella from retractors and saw blades.      At this point, attention was now directed to the femur.  The femoral   canal was opened with a drill, irrigated to try to prevent fat emboli.  An   intramedullary rod was passed at 5 degrees valgus, 10 mm of bone was   resected off the distal femur.  Following this resection, the tibia was   subluxated anteriorly.  Using the extramedullary guide, 2 mm of bone was resected off   the proximal medial tibia.  We confirmed the gap would be   stable medially and laterally with a 10 mm insert as well as confirmed   the cut was perpendicular in the coronal plane, checking with an alignment rod.      Once this was done, I sized the femur to be a size 7 in the anterior-   posterior dimension, chose a standard PS component based on medial and   lateral dimension.  At 3 degrees of external rotation the sizing block  was pinned and then the 4 in 1 cutting block positioned and pinned.  The   anterior, posterior, and  chamfer cuts were made without difficulty nor   notching making certain that I was along the anterior cortex to help   with flexion gap stability.      The final box cut was made off the lateral aspect of distal femur.      At this point, the tibia was sized to be a size 4+, the size 4+ tray was   then pinned in position through the medial third of the tubercle,   drilled for a short stemmed augment (due to his size) and keel punched.  Trial reduction was now carried with a 7 femur,  4+ tibia, a size 10 mm insert, and the size 5 patella botton.  The knee was brought to   extension, full extension with good flexion stability with the patella   tracking through the trochlea without application of pressure.  Given   all these findings, the trial components removed.  Final components were   opened, the tibial stem attached to the tray, and cement was mixed.  The knee was irrigated with normal saline   solution and pulse lavage.  The synovial lining was   then injected with 30 cc of 0.25% Marcaine with epinephrine and 1 cc of Toradol plus 30 cc of NS for a total of 61 cc.      The knee was irrigated.  Final implants were then cemented onto clean and   dried cut surfaces of bone with the knee brought to extension with a size 10   mm trial insert.      Once the cement had fully cured, the excess cement was removed   throughout the knee.  I confirmed I was satisfied with the range of   motion and stability, and the final size 10 mm PS insert was chosen.  It was   placed into the knee.      The tourniquet had been let down at 35 minutes.  No significant   hemostasis required.  The   extensor mechanism was then reapproximated using #1 Vicryl and #0 Stratafix sutures with the knee   in flexion.  The   remaining wound was closed with 2-0 Vicryl and running 4-0 Monocryl.   The knee was cleaned,  dried, dressed sterilely using Dermabond and   Aquacel dressing.  The patient was then   brought to recovery room in stable condition, tolerating the procedure   well.   Please note that Physician Assistant, Danae Orleans, PA-C, was present for the entirety of the case, and was utilized for pre-operative positioning, peri-operative retractor management, general facilitation of the procedure.  He was also utilized for primary wound closure at the end of the case.              Pietro Cassis Alvan Dame, M.D.    04/28/2017 1:08 PM

## 2017-04-28 NOTE — Anesthesia Preprocedure Evaluation (Addendum)
Anesthesia Evaluation  Patient identified by MRN, date of birth, ID band Patient awake    Reviewed: Allergy & Precautions, H&P , NPO status , Patient's Chart, lab work & pertinent test results  Airway Mallampati: III  TM Distance: >3 FB Neck ROM: full    Dental  (+) Caps, Dental Advisory Given,    Pulmonary neg pulmonary ROS,    Pulmonary exam normal breath sounds clear to auscultation       Cardiovascular Exercise Tolerance: Good (-) hypertensionnegative cardio ROS Normal cardiovascular exam Rhythm:regular Rate:Normal     Neuro/Psych  Headaches, PSYCHIATRIC DISORDERS Anxiety    GI/Hepatic negative GI ROS, Neg liver ROS,   Endo/Other  negative endocrine ROSdiabetes, Well Controlled, Type 2, Oral Hypoglycemic AgentsObesity  Renal/GU CRFRenal disease  negative genitourinary   Musculoskeletal  (+) Arthritis , Osteoarthritis,    Abdominal   Peds  Hematology negative hematology ROS (+)   Anesthesia Other Findings Hx pancreatic cancer  Reproductive/Obstetrics negative OB ROS                            Anesthesia Physical  Anesthesia Plan  ASA: II  Anesthesia Plan: Spinal   Post-op Pain Management:  Regional for Post-op pain   Induction: Intravenous  PONV Risk Score and Plan: Propofol infusion and Treatment may vary due to age or medical condition  Airway Management Planned: Nasal Cannula and Natural Airway  Additional Equipment: None  Intra-op Plan:   Post-operative Plan:   Informed Consent: I have reviewed the patients History and Physical, chart, labs and discussed the procedure including the risks, benefits and alternatives for the proposed anesthesia with the patient or authorized representative who has indicated his/her understanding and acceptance.   Dental Advisory Given  Plan Discussed with: CRNA  Anesthesia Plan Comments:        Anesthesia Quick Evaluation

## 2017-04-28 NOTE — Discharge Instructions (Signed)

## 2017-04-28 NOTE — Anesthesia Procedure Notes (Signed)
Spinal  Patient location during procedure: OR Start time: 04/28/2017 1:08 PM End time: 04/28/2017 1:13 PM Staffing Anesthesiologist: Audry Pili Resident/CRNA: Darlys Gales R Performed: resident/CRNA  Preanesthetic Checklist Completed: patient identified, site marked, surgical consent, pre-op evaluation, timeout performed, IV checked, risks and benefits discussed and monitors and equipment checked Spinal Block Patient position: sitting Prep: DuraPrep Patient monitoring: heart rate, cardiac monitor, continuous pulse ox and blood pressure Approach: midline Location: L3-4 Needle Needle type: Pencan  Needle gauge: 24 G Needle length: 10 cm Needle insertion depth: 8 cm Assessment Sensory level: T6

## 2017-04-28 NOTE — Anesthesia Procedure Notes (Signed)
Anesthesia Regional Block: Adductor canal block   Pre-Anesthetic Checklist: ,, timeout performed, Correct Patient, Correct Site, Correct Laterality, Correct Procedure, Correct Position, site marked, Risks and benefits discussed,  Surgical consent,  Pre-op evaluation,  At surgeon's request and post-op pain management  Laterality: Left  Prep: chloraprep       Needles:  Injection technique: Single-shot  Needle Type: Echogenic Needle     Needle Length: 9cm  Needle Gauge: 21     Additional Needles:   Narrative:  Start time: 04/28/2017 12:39 PM End time: 04/28/2017 12:42 PM Injection made incrementally with aspirations every 5 mL.  Performed by: Personally  Anesthesiologist: Renold Don E  Additional Notes: No pain on injection. No increased resistance to injection. Injection made in 5cc increments. Good needle visualization. Patient tolerated the procedure well.

## 2017-04-29 DIAGNOSIS — E1122 Type 2 diabetes mellitus with diabetic chronic kidney disease: Secondary | ICD-10-CM | POA: Diagnosis not present

## 2017-04-29 DIAGNOSIS — F418 Other specified anxiety disorders: Secondary | ICD-10-CM | POA: Diagnosis not present

## 2017-04-29 DIAGNOSIS — Z79899 Other long term (current) drug therapy: Secondary | ICD-10-CM | POA: Diagnosis not present

## 2017-04-29 DIAGNOSIS — N189 Chronic kidney disease, unspecified: Secondary | ICD-10-CM | POA: Diagnosis not present

## 2017-04-29 DIAGNOSIS — M1712 Unilateral primary osteoarthritis, left knee: Secondary | ICD-10-CM | POA: Diagnosis not present

## 2017-04-29 DIAGNOSIS — E785 Hyperlipidemia, unspecified: Secondary | ICD-10-CM | POA: Diagnosis not present

## 2017-04-29 DIAGNOSIS — Z791 Long term (current) use of non-steroidal anti-inflammatories (NSAID): Secondary | ICD-10-CM | POA: Diagnosis not present

## 2017-04-29 DIAGNOSIS — Z6830 Body mass index (BMI) 30.0-30.9, adult: Secondary | ICD-10-CM | POA: Diagnosis not present

## 2017-04-29 DIAGNOSIS — E669 Obesity, unspecified: Secondary | ICD-10-CM | POA: Diagnosis not present

## 2017-04-29 DIAGNOSIS — Z888 Allergy status to other drugs, medicaments and biological substances status: Secondary | ICD-10-CM | POA: Diagnosis not present

## 2017-04-29 DIAGNOSIS — Z8507 Personal history of malignant neoplasm of pancreas: Secondary | ICD-10-CM | POA: Diagnosis not present

## 2017-04-29 DIAGNOSIS — Z7982 Long term (current) use of aspirin: Secondary | ICD-10-CM | POA: Diagnosis not present

## 2017-04-29 LAB — CBC
HEMATOCRIT: 39.5 % (ref 39.0–52.0)
HEMOGLOBIN: 13.6 g/dL (ref 13.0–17.0)
MCH: 30.5 pg (ref 26.0–34.0)
MCHC: 34.4 g/dL (ref 30.0–36.0)
MCV: 88.6 fL (ref 78.0–100.0)
Platelets: 223 10*3/uL (ref 150–400)
RBC: 4.46 MIL/uL (ref 4.22–5.81)
RDW: 12.8 % (ref 11.5–15.5)
WBC: 12.2 10*3/uL — ABNORMAL HIGH (ref 4.0–10.5)

## 2017-04-29 LAB — BASIC METABOLIC PANEL
Anion gap: 9 (ref 5–15)
BUN: 16 mg/dL (ref 6–20)
CHLORIDE: 105 mmol/L (ref 101–111)
CO2: 26 mmol/L (ref 22–32)
Calcium: 8.6 mg/dL — ABNORMAL LOW (ref 8.9–10.3)
Creatinine, Ser: 0.69 mg/dL (ref 0.61–1.24)
GFR calc Af Amer: >60 mL/min (ref >60)
GFR calc non Af Amer: >60 mL/min (ref >60)
GLUCOSE: 146 mg/dL — AB (ref 65–99)
POTASSIUM: 4.5 mmol/L (ref 3.5–5.1)
Sodium: 140 mmol/L (ref 135–145)

## 2017-04-29 LAB — GLUCOSE, CAPILLARY
GLUCOSE-CAPILLARY: 131 mg/dL — AB (ref 65–99)
Glucose-Capillary: 124 mg/dL — ABNORMAL HIGH (ref 65–99)

## 2017-04-29 MED ORDER — HYDROMORPHONE HCL 2 MG PO TABS
2.0000 mg | ORAL_TABLET | ORAL | Status: DC
  Administered 2017-04-29: 2 mg via ORAL
  Administered 2017-04-29: 4 mg via ORAL
  Administered 2017-04-29: 2 mg via ORAL
  Filled 2017-04-29: qty 2
  Filled 2017-04-29 (×2): qty 1

## 2017-04-29 MED ORDER — HYDROMORPHONE HCL 2 MG PO TABS: 2.0000 mg | ORAL_TABLET | ORAL | 0 refills | Status: DC | PRN

## 2017-04-29 MED ORDER — ASPIRIN 81 MG PO CHEW: 81.0000 mg | CHEWABLE_TABLET | Freq: Two times a day (BID) | ORAL | 0 refills | Status: AC

## 2017-04-29 NOTE — Evaluation (Signed)
Occupational Therapy Evaluation Patient Details Name: [name] MRN: [id number] DOB: 1960/12/23 Today's Date: 04/29/2017    History of Present Illness Pt is a 56 year old male s/p L TKA   Clinical Impression   This 56 y/o M presents with the above. At baseline Pt was independent with ADLs, using SPC for functional mobility (longer distances only). Pt completed functional mobility at RW level with MinGuard assist this session, requires MaxA for LB ADLs secondary to LLE limitations. Pt will return home with spouse assist for ADLs PRN. Education provided and questions answered throughout with Pt verbalizing/demonstrating good understanding. Pt reports feeling comfortable completing ADLs after return home and with spouse assist PRN. No further acute OT needs identified at this time. Will sign off.     Follow Up Recommendations  DC plan and follow up therapy as arranged by surgeon;Supervision/Assistance - 24 hour    Equipment Recommendations  None recommended by OT           Precautions / Restrictions Precautions Precautions: Fall;Knee Precaution Comments: verbally reviewed  Restrictions Weight Bearing Restrictions: No Other Position/Activity Restrictions: WBAT      Mobility Bed Mobility Overal bed mobility: Needs Assistance Bed Mobility: Supine to Sit;Sit to Supine     Supine to sit: Supervision Sit to supine: Min assist   General bed mobility comments: supervision for safety; Pt's caregiver providing MinA for LLE management when returning to supine with therapist supervision   Transfers Overall transfer level: Needs assistance Equipment used: Rolling walker (2 wheeled) Transfers: Sit to/from Stand Sit to Stand: Min guard         General transfer comment: verbal cues for UE and LE positioning                                               ADL either performed or assessed with clinical judgement   ADL Overall ADL's : Needs  assistance/impaired Eating/Feeding: Set up;Sitting   Grooming: Oral care;Min guard;Standing   Upper Body Bathing: Min guard;Sitting   Lower Body Bathing: Minimal assistance;Sit to/from stand   Upper Body Dressing : Min guard;Sitting   Lower Body Dressing: Maximal assistance;Sit to/from stand   Toilet Transfer: Min guard;Ambulation;Comfort height toilet;RW Armed forces technical officer Details (indicate cue type and reason): simulated in bed to chair transfer  Dahlgren and Hygiene: Min guard;Sit to/from stand   Tub/ Shower Transfer: Walk-in shower;Min guard;Ambulation;Rolling walker Tub/Shower Transfer Details (indicate cue type and reason): Pt demonstrates good carryover of technique/sequence after education provided; educated on use of 3:1 for increased safety during task completion;  Pt with level threshold into shower, discussed/educated on options for entering shower safely with use of RW  Functional mobility during ADLs: Min guard;Rolling walker General ADL Comments: educated on compensatory techniques for completing ADLs                          Pertinent Vitals/Pain Pain Assessment: Faces Pain Score: 6  Faces Pain Scale: Hurts even more Pain Location: L knee Pain Descriptors / Indicators: Aching;Sore Pain Intervention(s): Limited activity within patient's tolerance;Monitored during session;Ice applied          Extremity/Trunk Assessment Upper Extremity Assessment Upper Extremity Assessment: Overall WFL for tasks assessed   Lower Extremity Assessment Lower Extremity Assessment: Defer to PT evaluation LLE Deficits / Details: unable to perform SLR, fair  quad contraction, L AAROM knee flexion 30*       Communication Communication Communication: No difficulties   Cognition Arousal/Alertness: Awake/alert Behavior During Therapy: WFL for tasks assessed/performed Overall Cognitive Status: Within Functional Limits for tasks assessed                                                      Home Living Family/patient expects to be discharged to:: Private residence Living Arrangements: Spouse/significant other Available Help at Discharge: Family Type of Home: House Home Access: Level entry     Home Layout: One level     Bathroom Shower/Tub: Occupational psychologist: Handicapped height     Home Equipment: Environmental consultant - 2 wheels;Bedside commode          Prior Functioning/Environment Level of Independence: Independent                 OT Problem List: Decreased strength;Decreased range of motion;Decreased knowledge of use of DME or AE;Decreased knowledge of precautions            OT Goals(Current goals can be found in the care plan section) Acute Rehab OT Goals Patient Stated Goal: return home  OT Goal Formulation: With patient                                 AM-PAC PT "6 Clicks" Daily Activity     Outcome Measure Help from another person eating meals?: None Help from another person taking care of personal grooming?: A Little Help from another person toileting, which includes using toliet, bedpan, or urinal?: A Little Help from another person bathing (including washing, rinsing, drying)?: A Little Help from another person to put on and taking off regular upper body clothing?: None Help from another person to put on and taking off regular lower body clothing?: A Lot 6 Click Score: 19   End of Session Equipment Utilized During Treatment: Gait belt;Rolling walker Nurse Communication: Mobility status  Activity Tolerance: Patient tolerated treatment well Patient left: in bed;with call bell/phone within reach;with family/visitor present  OT Visit Diagnosis: Pain;Other abnormalities of gait and mobility (R26.89) Pain - Right/Left: Left Pain - part of body: Knee                Time: 7408-1448 OT Time Calculation (min): 20 min Charges:  OT General Charges $OT Visit: 1 Visit OT  Evaluation $OT Eval Low Complexity: 1 Low G-Codes: OT G-codes **NOT FOR INPATIENT CLASS** Functional Assessment Tool Used: AM-PAC 6 Clicks Daily Activity;Clinical judgement Functional Limitation: Self care Self Care Current Status (J8563): At least 20 percent but less than 40 percent impaired, limited or restricted Self Care Goal Status (J4970): At least 20 percent but less than 40 percent impaired, limited or restricted Self Care Discharge Status [phone]): At least 20 percent but less than 40 percent impaired, limited or restricted   Lou Cal, OT Pager [phone] 04/29/2017   Raymondo Band 04/29/2017, 11:59 AM

## 2017-04-29 NOTE — Progress Notes (Signed)
Physical Therapy Treatment Patient Details Name: [name] MRN: [id number] DOB: Aug 12, 1960 Today's Date: 04/29/2017    History of Present Illness Pt is a 56 year old male s/p L TKA    PT Comments    Pt ambulated again in hallway.  Pt also performed exercises again x5 reps to review HEP.  Pt feels ready for d/c home today.  Follow Up Recommendations  Outpatient PT     Equipment Recommendations  None recommended by PT    Recommendations for Other Services       Precautions / Restrictions Precautions Precautions: Fall;Knee Precaution Comments: verbally reviewed  Restrictions Weight Bearing Restrictions: No Other Position/Activity Restrictions: WBAT    Mobility  Bed Mobility Overal bed mobility: Needs Assistance Bed Mobility: Supine to Sit;Sit to Supine     Supine to sit: Min assist Sit to supine: Min assist   General bed mobility comments: assist per caregiver for trunk, assisted LE onto bed for pain control  Transfers Overall transfer level: Needs assistance Equipment used: Rolling walker (2 wheeled) Transfers: Sit to/from Stand Sit to Stand: Supervision         General transfer comment: verbal cues for UE and LE positioning  Ambulation/Gait Ambulation/Gait assistance: Supervision Ambulation Distance (Feet): 160 Feet Assistive device: Rolling walker (2 wheeled) Gait Pattern/deviations: Step-to pattern;Decreased stance time - left;Antalgic     General Gait Details: verbal cues for sequence, RW positioning, step length   Stairs            Wheelchair Mobility    Modified Rankin (Stroke Patients Only)       Balance                                            Cognition Arousal/Alertness: Awake/alert Behavior During Therapy: WFL for tasks assessed/performed Overall Cognitive Status: Within Functional Limits for tasks assessed                                        Exercises Total Joint Exercises Ankle  Circles/Pumps: AROM;Both;5 reps Quad Sets: AROM;Both;5 reps Short Arc Quad: AROM;Left;5 reps Heel Slides: AAROM;Left;5 reps;Seated;Supine Hip ABduction/ADduction: AAROM;Left;5 reps Straight Leg Raises: AAROM;Left;5 reps Long Arc Quad: AAROM;Left;5 reps    General Comments        Pertinent Vitals/Pain Pain Assessment: 0-10 Pain Score: 7  Faces Pain Scale: Hurts even more Pain Location: L knee Pain Descriptors / Indicators: Aching;Sore Pain Intervention(s): Limited activity within patient's tolerance;Monitored during session;Ice applied;Repositioned    Home Living Family/patient expects to be discharged to:: Private residence Living Arrangements: Spouse/significant other Available Help at Discharge: Family Type of Home: House Home Access: Level entry   Home Layout: One [place]: Environmental consultant - 2 wheels;Bedside commode      Prior Function Level of Independence: Independent          PT Goals (current goals can now be found in the care plan section) Acute Rehab PT Goals Patient Stated Goal: return home  PT Goal Formulation: With patient Time For Goal Achievement: 05/02/17 Potential to Achieve Goals: Good Progress towards PT goals: Progressing toward goals    Frequency    7X/week      PT Plan Current plan remains appropriate    Co-evaluation  AM-PAC PT "6 Clicks" Daily Activity  Outcome Measure  Difficulty turning over in bed (including adjusting bedclothes, sheets and blankets)?: A Little Difficulty moving from lying on back to sitting on the side of the bed? : A Lot Difficulty sitting down on and standing up from a chair with arms (e.g., wheelchair, bedside commode, etc,.)?: A Little Help needed moving to and from a bed to chair (including a wheelchair)?: A Little Help needed walking in hospital room?: A Little Help needed climbing 3-5 steps with a railing? : A Little 6 Click Score: 17    End of Session   Activity Tolerance:  Patient tolerated treatment well Patient left: with call bell/phone within reach;with family/visitor present;in bed Nurse Communication: Mobility status PT Visit Diagnosis: Difficulty in walking, not elsewhere classified (R26.2);Pain Pain - Right/Left: Left Pain - part of body: Knee     Time: 1330-1350 PT Time Calculation (min) (ACUTE ONLY): 20 min  Charges:  $Gait Training: 8-22 mins                    G Codes:  Functional Assessment Tool Used: AM-PAC 6 Clicks Basic Mobility;Clinical judgement Functional Limitation: Mobility: Walking and moving around Mobility: Walking and Moving Around Current Status (W5809): At least 20 percent but less than 40 percent impaired, limited or restricted Mobility: Walking and Moving Around Goal Status [phone]): At least 1 percent but less than 20 percent impaired, limited or restricted    Carmelia Bake, PT, DPT 04/29/2017 Pager: [phone]   York Ram E 04/29/2017, 2:22 PM

## 2017-04-29 NOTE — Discharge Summary (Signed)
Physician Discharge Summary  Patient ID: [name] MRN: [id number] DOB/AGE: 1960-11-05 56 y.o.  Admit date: 04/28/2017 Discharge date:  04/29/2017  Procedures:  Procedure(s) (LRB): LEFT TOTAL KNEE ARTHROPLASTY (Left)  Attending Physician:  Dr. Paralee Cancel   Admission Diagnoses:   Left knee primary OA / pain  Discharge Diagnoses:  Principal Problem:   S/P left TKA Active Problems:   Obesity (BMI 30-39.9)   S/P total knee replacement  Past Medical History:  Diagnosis Date  . Arthritis   . BURSITIS, LEFT HIP 05/20/2010  . Cancer [facility]) 2010   pancreatic cancer  . Chronic kidney disease    stones; 13 stones, currently has 2 stones   . Claustrophobia    "take Lorazepam if I have to fly"  . DIAB W/O COMP TYPE II/UNS NOT STATED UNCNTRL 03/30/2009  . ELEVATED BLOOD PRESSURE 03/30/2009  . Fissure, anal    occ bleeds still  . Headache(784.0)    "only when I was working; had headaches and migraines"  . Hyperlipidemia 10/11/2010  . SHOULDER PAIN, LEFT 10/20/2008    HPI:    [name], 56 y.o. male, has a history of pain and functional disability in the left knee due to arthritis and has failed non-surgical conservative treatments for greater than 12 weeks to include NSAID's and/or analgesics, corticosteriod injections, activity modification and joint aspirations.  Onset of symptoms was gradual, starting >10 years ago with gradually worsening course since that time. The patient noted prior procedures on the knee to include  arthroscopy on the left knee(s).  Patient currently rates pain in the left knee(s) at 9 out of 10 with activity. Patient has night pain, worsening of pain with activity and weight bearing, pain that interferes with activities of daily living, pain with passive range of motion, crepitus and joint swelling.  Patient has evidence of periarticular osteophytes and joint space narrowing by imaging studies.  There is no active infection.  Risks, benefits and expectations  were discussed with the patient.  Risks including but not limited to the risk of anesthesia, blood clots, nerve damage, blood vessel damage, failure of the prosthesis, infection and up to and including death.  Patient understand the risks, benefits and expectations and wishes to proceed with surgery.  PCP: Cody Post, MD   Discharged Condition: good  Hospital Course:  Patient underwent the above stated procedure on 04/28/2017. Patient tolerated the procedure well and brought to the recovery room in good condition and subsequently to the floor.  POD #1 BP: 123/76 ; Pulse: 69 ; Temp: 98.7 F (37.1 C) ; Resp: 16 Patient reports pain as moderate/severe.  Pain not well controlled on tramadol.  Options were discussed and it was decided to increase his analgesic medication.  Patient unable to take Norco or Oxycodone due to severe side effects.  Patient ready to be discharged home. Dorsiflexion/plantar flexion intact, incision: dressing C/D/I, no cellulitis present and compartment soft.   LABS  Basename    HGB     13.6  HCT     39.5    Discharge Exam: General appearance: alert, cooperative and no distress Extremities: Homans sign is negative, no sign of DVT, no edema, redness or tenderness in the calves or thighs and no ulcers, gangrene or trophic changes  Disposition: Home with follow up in 2 weeks   Follow-up Information    Paralee Cancel, MD. Schedule an appointment as soon as possible for a visit in 2 week(s).   Specialty:  Orthopedic  Surgery Contact information: [address] [place] [address] [phone]           Discharge Instructions    Call MD / Call 911    Complete by:  As directed    If you experience chest pain or shortness of breath, CALL 911 and be transported to the hospital emergency room.  If you develope a fever above 101 F, pus (white drainage) or increased drainage or redness at the wound, or calf pain, call your surgeon's office.    Change dressing    Complete by:  As directed    Maintain surgical dressing until follow up in the clinic. If the edges start to pull up, may reinforce with tape. If the dressing is no longer working, may remove and cover with gauze and tape, but must keep the area dry and clean.  Call with any questions or concerns.   Constipation Prevention    Complete by:  As directed    Drink plenty of fluids.  Prune juice may be helpful.  You may use a stool softener, such as Colace (over the counter) 100 mg twice a day.  Use MiraLax (over the counter) for constipation as needed.   Diet - low sodium heart healthy    Complete by:  As directed    Discharge instructions    Complete by:  As directed    Maintain surgical dressing until follow up in the clinic. If the edges start to pull up, may reinforce with tape. If the dressing is no longer working, may remove and cover with gauze and tape, but must keep the area dry and clean.  Follow up in 2 weeks at [facility]. Call with any questions or concerns.   Increase activity slowly as tolerated    Complete by:  As directed    Weight bearing as tolerated with assist device (walker, cane, etc) as directed, use it as long as suggested by your surgeon or therapist, typically at least 4-6 weeks.   TED hose    Complete by:  As directed    Use stockings (TED hose) for 2 weeks on both leg(s).  You may remove them at night for sleeping.      Allergies as of 04/29/2017      Reactions   Merthiolate [thimerosol]    Unknown reaction   Morphine    REACTION: paronia   Nickel Dermatitis      Medication List    STOP taking these medications   aspirin 81 MG tablet Replaced by:  aspirin 81 MG chewable tablet     TAKE these medications   acetaminophen 500 MG tablet Commonly known as:  TYLENOL Take 2 tablets (1,000 mg total) by mouth every 8 (eight) hours.   aspirin 81 MG chewable tablet Commonly known as:  ASPIRIN CHILDRENS Chew 1 tablet (81 mg total)  by mouth 2 (two) times daily. Take for 4 weeks, then resume regular dose. Replaces:  aspirin 81 MG tablet   atorvastatin 20 MG tablet Commonly known as:  LIPITOR Take 1 tablet (20 mg total) by mouth daily.   celecoxib 200 MG capsule Commonly known as:  CELEBREX TAKE ONE CAPSULE BY MOUTH TWICE A DAY   CENTRUM PO Take 1 tablet by mouth daily.   cetirizine 10 MG tablet Commonly known as:  ZYRTEC Take 10 mg by mouth at bedtime.   cyclobenzaprine 10 MG tablet Commonly known as:  FLEXERIL Take 1 tablet (10 mg total) by mouth 3 (three)  times daily as needed for muscle spasms.   diphenhydrAMINE 25 MG tablet Commonly known as:  BENADRYL Take 50 mg by mouth at bedtime as needed.   docusate sodium 100 MG capsule Commonly known as:  COLACE Take 1 capsule (100 mg total) by mouth 2 (two) times daily.   empagliflozin 10 MG Tabs tablet Commonly known as:  JARDIANCE Take 10 mg by mouth daily.   ferrous sulfate 325 (65 FE) MG tablet Commonly known as:  FERROUSUL Take 1 tablet (325 mg total) by mouth 3 (three) times daily with meals.   glucose blood test strip Commonly known as:  CONTOUR NEXT TEST Use as instructed to check blood sugar once a day   HYDROmorphone 2 MG tablet Commonly known as:  DILAUDID Take 1-2 tablets (2-4 mg total) by mouth every 4 (four) hours as needed for severe pain.   Lancet Device Misc Contour next test lancets.  Test once daily.  Dx E11.9   LORazepam 1 MG tablet Commonly known as:  ATIVAN Take 1 tablet (1 mg total) by mouth every 6 (six) hours as needed for anxiety.   metFORMIN 1000 MG (MOD) 24 hr tablet Commonly known as:  GLUMETZA Take 1 tablet (1,000 mg total) by mouth daily with breakfast.   polyethylene glycol packet Commonly known as:  MIRALAX / GLYCOLAX Take 17 g by mouth 2 (two) times daily.   potassium citrate 10 MEQ (1080 MG) SR tablet Commonly known as:  UROCIT-K TAKE ONE TABLET BY MOUTH TWICE A DAY   NEED TO SCHEDULE AN OFFICE VISIT  FOR MORE REFILLS            Discharge Care Instructions        Start     Ordered   04/29/17 0000  aspirin (ASPIRIN CHILDRENS) 81 MG chewable tablet  2 times daily    Question:  Supervising Provider  Answer:  Paralee Cancel   04/29/17 0839   04/29/17 0000  HYDROmorphone (DILAUDID) 2 MG tablet  Every 4 hours PRN    Question:  Supervising Provider  Answer:  Paralee Cancel   04/29/17 0839   04/29/17 0000  Call MD / Call 911    Comments:  If you experience chest pain or shortness of breath, CALL 911 and be transported to the hospital emergency room.  If you develope a fever above 101 F, pus (white drainage) or increased drainage or redness at the wound, or calf pain, call your surgeon's office.   04/29/17 0839   04/29/17 0000  Discharge instructions    Comments:  Maintain surgical dressing until follow up in the clinic. If the edges start to pull up, may reinforce with tape. If the dressing is no longer working, may remove and cover with gauze and tape, but must keep the area dry and clean.  Follow up in 2 weeks at [facility]. Call with any questions or concerns.   04/29/17 0839   04/29/17 0000  Diet - low sodium heart healthy     04/29/17 0839   04/29/17 0000  Constipation Prevention    Comments:  Drink plenty of fluids.  Prune juice may be helpful.  You may use a stool softener, such as Colace (over the counter) 100 mg twice a day.  Use MiraLax (over the counter) for constipation as needed.   04/29/17 0839   04/29/17 0000  Increase activity slowly as tolerated    Comments:  Weight bearing as tolerated with assist device (walker, cane, etc) as directed, use  it as long as suggested by your surgeon or therapist, typically at least 4-6 weeks.   04/29/17 0839   04/29/17 0000  TED hose    Comments:  Use stockings (TED hose) for 2 weeks on both leg(s).  You may remove them at night for sleeping.   04/29/17 0839   04/29/17 0000  Change dressing    Comments:  Maintain surgical  dressing until follow up in the clinic. If the edges start to pull up, may reinforce with tape. If the dressing is no longer working, may remove and cover with gauze and tape, but must keep the area dry and clean.  Call with any questions or concerns.   04/29/17 0839   04/28/17 0000  docusate sodium (COLACE) 100 MG capsule  2 times daily    Question:  Supervising Provider  Answer:  Paralee Cancel   04/28/17 1257   04/28/17 0000  ferrous sulfate (FERROUSUL) 325 (65 FE) MG tablet  3 times daily with meals    Question:  Supervising Provider  Answer:  Paralee Cancel   04/28/17 1257   04/28/17 0000  polyethylene glycol (MIRALAX / GLYCOLAX) packet  2 times daily    Question:  Supervising Provider  Answer:  Paralee Cancel   04/28/17 1257   04/28/17 0000  acetaminophen (TYLENOL) 500 MG tablet  Every 8 hours    Question:  Supervising Provider  Answer:  Paralee Cancel   04/28/17 1257       Signed: West Pugh. Bettyjane Shenoy   PA-C  04/29/2017, 8:57 AM

## 2017-04-29 NOTE — Progress Notes (Signed)
     Subjective: 1 Day Post-Op Procedure(s) (LRB): LEFT TOTAL KNEE ARTHROPLASTY (Left)   Seen by Dr. Alvan Dame. Patient reports pain as moderate/severe.  Pain not well controlled on tramadol.  Options were discussed and it was decided to increase his analgesic medication.  Patient unable to take Norco or Oxycodone due to severe side effects.  Patient ready to be discharged home if he does well with PT and pain is controlled with the change of medication.   Objective:   VITALS:   Vitals:   04/29/17 0109 04/29/17 0448  BP: 131/79 123/76  Pulse: 82 69  Resp: 17 16  Temp: 98.2 F (36.8 C) 98.7 F (37.1 C)  SpO2: 96% 97%    Dorsiflexion/Plantar flexion intact Incision: dressing C/D/I No cellulitis present Compartment soft  LABS  Recent Labs  04/29/17 0543  HGB 13.6  HCT 39.5  WBC 12.2*  PLT 223     Recent Labs  04/29/17 0543  NA 140  K 4.5  BUN 16  CREATININE 0.69  GLUCOSE 146*     Assessment/Plan: 1 Day Post-Op Procedure(s) (LRB): LEFT TOTAL KNEE ARTHROPLASTY (Left) Foley cath d/c'ed Advance diet Up with therapy D/C IV fluids Discharge home Follow up in 2 weeks at [facility]. Follow up with OLIN,Omar Gayden D in 2 weeks.  Contact information:  [facility] [address], [place] [phone]    Obese (BMI 30-39.9) Estimated body mass index is 38.39 kg/m as calculated from the following:   Height as of this encounter: 6\' 1"  (1.854 m).   Weight as of this encounter: 132 kg (291 lb). Patient also counseled that weight may inhibit the healing process Patient counseled that losing weight will help with future health issues         West Pugh. Ranelle Auker   PAC  04/29/2017, 8:32 AM

## 2017-04-29 NOTE — Progress Notes (Signed)
Discharge plan confirmed with patient. Patient states he is set up for outpatient physical therapy and has a RW and shower seat at home. Marney Doctor RN,BSN,NCM [phone]

## 2017-04-29 NOTE — Evaluation (Signed)
Physical Therapy Evaluation Patient Details Name: [name] MRN: [id number] DOB: 05-23-1961 Today's Date: 04/29/2017   History of Present Illness  Pt is a 56 year old male s/p L TKA  Clinical Impression  Pt is s/p TKA resulting in the deficits listed below (see PT Problem List).  Pt will benefit from skilled PT to increase their independence and safety with mobility to allow discharge to the venue listed below.  Pt ambulated in hallway and performed LE exercises.  Pt would like to have another visit prior to d/c home later today.      Follow Up Recommendations Outpatient PT    Equipment Recommendations  None recommended by PT    Recommendations for Other Services       Precautions / Restrictions Precautions Precautions: Fall;Knee Precaution Comments: verbally reviewed  Restrictions Weight Bearing Restrictions: No Other Position/Activity Restrictions: WBAT      Mobility  Bed Mobility               General bed mobility comments: pt sitting EOB after using bathroom on arrival  Transfers Overall transfer level: Needs assistance Equipment used: Rolling walker (2 wheeled) Transfers: Sit to/from Stand Sit to Stand: Min guard         General transfer comment: verbal cues for UE and LE positioning  Ambulation/Gait Ambulation/Gait assistance: Min guard Ambulation Distance (Feet): 160 Feet Assistive device: Rolling walker (2 wheeled) Gait Pattern/deviations: Step-to pattern;Decreased stance time - left;Antalgic     General Gait Details: verbal cues for sequence, RW positioning, step length  Stairs            Wheelchair Mobility    Modified Rankin (Stroke Patients Only)       Balance                                             Pertinent Vitals/Pain Pain Assessment: Faces Pain Score: 6  Faces Pain Scale: Hurts even more Pain Location: L knee Pain Descriptors / Indicators: Aching;Sore Pain Intervention(s): Limited activity  within patient's tolerance;Monitored during session;Ice applied    Home Living Family/patient expects to be discharged to:: Private residence Living Arrangements: Spouse/significant other Available Help at Discharge: Family Type of Home: House Home Access: Level entry     Home Layout: One [place]ora: Environmental consultant - 2 wheels;Bedside commode      Prior Function Level of Independence: Independent               Hand Dominance        Extremity/Trunk Assessment   Upper Extremity Assessment Upper Extremity Assessment: Overall WFL for tasks assessed    Lower Extremity Assessment Lower Extremity Assessment: Defer to PT evaluation LLE Deficits / Details: unable to perform SLR, fair quad contraction, L AAROM knee flexion 30*       Communication   Communication: No difficulties  Cognition Arousal/Alertness: Awake/alert Behavior During Therapy: WFL for tasks assessed/performed Overall Cognitive Status: Within Functional Limits for tasks assessed                                        General Comments      Exercises Total Joint Exercises Ankle Circles/Pumps: AROM;Both;10 reps Quad Sets: AROM;Both;10 reps Short Arc QuadSinclair Ship;Left;10 reps Heel Slides: AAROM;Left;10 reps Hip ABduction/ADduction: AAROM;Left;10 reps  Straight Leg Raises: AAROM;Left;10 reps   Assessment/Plan    PT Assessment Patient needs continued PT services  PT Problem List Decreased strength;Decreased mobility;Decreased range of motion;Decreased knowledge of use of DME;Pain;Decreased knowledge of precautions       PT Treatment Interventions Functional mobility training;Stair training;Gait training;Therapeutic activities;Therapeutic exercise;DME instruction;Patient/family education    PT Goals (Current goals can be found in the Care Plan section)  Acute Rehab PT Goals PT Goal Formulation: With patient Time For Goal Achievement: 05/02/17 Potential to Achieve Goals: Good     Frequency 7X/week   Barriers to discharge        Co-evaluation               AM-PAC PT "6 Clicks" Daily Activity  Outcome Measure Difficulty turning over in bed (including adjusting bedclothes, sheets and blankets)?: None Difficulty moving from lying on back to sitting on the side of the bed? : A Little Difficulty sitting down on and standing up from a chair with arms (e.g., wheelchair, bedside commode, etc,.)?: A Little Help needed moving to and from a bed to chair (including a wheelchair)?: A Little Help needed walking in hospital room?: A Little Help needed climbing 3-5 steps with a railing? : A Little 6 Click Score: 19    End of Session   Activity Tolerance: Patient tolerated treatment well Patient left: in chair;with call bell/phone within reach;with family/visitor present;with nursing/sitter in room Nurse Communication: Mobility status PT Visit Diagnosis: Difficulty in walking, not elsewhere classified (R26.2);Pain Pain - Right/Left: Left Pain - part of body: Knee    Time: 6226-3335 PT Time Calculation (min) (ACUTE ONLY): 23 min   Charges:   PT Evaluation $PT Eval Low Complexity: 1 Low PT Treatments $Therapeutic Exercise: 8-22 mins   PT G Codes:   PT G-Codes **NOT FOR INPATIENT CLASS** Functional Assessment Tool Used: AM-PAC 6 Clicks Basic Mobility;Clinical judgement Functional Limitation: Mobility: Walking and moving around Mobility: Walking and Moving Around Current Status (K5625): At least 20 percent but less than 40 percent impaired, limited or restricted Mobility: Walking and Moving Around Goal Status [phone]): At least 1 percent but less than 20 percent impaired, limited or restricted    Carmelia Bake, PT, DPT 04/29/2017 Pager: [phone]  York Ram E 04/29/2017, 11:55 AM

## 2017-04-30 ENCOUNTER — Encounter (HOSPITAL_COMMUNITY): Payer: Self-pay | Admitting: Orthopedic Surgery

## 2017-05-01 ENCOUNTER — Encounter: Payer: Self-pay | Admitting: Physical Therapy

## 2017-05-01 ENCOUNTER — Ambulatory Visit: Payer: BLUE CROSS/BLUE SHIELD | Attending: Orthopedic Surgery | Admitting: Physical Therapy

## 2017-05-01 DIAGNOSIS — M25562 Pain in left knee: Secondary | ICD-10-CM | POA: Diagnosis not present

## 2017-05-01 DIAGNOSIS — M25662 Stiffness of left knee, not elsewhere classified: Secondary | ICD-10-CM | POA: Insufficient documentation

## 2017-05-01 DIAGNOSIS — R2242 Localized swelling, mass and lump, left lower limb: Secondary | ICD-10-CM | POA: Insufficient documentation

## 2017-05-01 DIAGNOSIS — R262 Difficulty in walking, not elsewhere classified: Secondary | ICD-10-CM | POA: Diagnosis not present

## 2017-05-01 NOTE — Therapy (Signed)
[address] [place], [place], [address] Phone: [phone]   Fax:  [phone]  Physical Therapy Evaluation  Patient Details  Name: [name] MRN: [id number] Date of Birth: [birth year] Referring Provider: Alvan Dame  Encounter Date: 05/01/2017      PT End of Session - 05/01/17 1009    Visit Number 1   Date for PT Re-Evaluation 07/01/17   PT Start Time 0925   PT Stop Time 1020   PT Time Calculation (min) 55 min   Activity Tolerance Patient tolerated treatment well   Behavior During Therapy [facility] for tasks assessed/performed      Past Medical History:  Diagnosis Date  . Arthritis   . BURSITIS, LEFT HIP 05/20/2010  . Cancer [facility]) 2010   pancreatic cancer  . Chronic kidney disease    stones; 13 stones, currently has 2 stones   . Claustrophobia    "take Lorazepam if I have to fly"  . DIAB W/O COMP TYPE II/UNS NOT STATED UNCNTRL 03/30/2009  . ELEVATED BLOOD PRESSURE 03/30/2009  . Fissure, anal    occ bleeds still  . Headache(784.0)    "only when I was working; had headaches and migraines"  . Hyperlipidemia 10/11/2010  . SHOULDER PAIN, LEFT 10/20/2008    Past Surgical History:  Procedure Laterality Date  . ANAL FISSURE REPAIR  07/28/2011   Procedure: ANAL FISSURE REPAIR;  Surgeon: Adin Hector, MD;  [facility];  Service: General;  Laterality: N/A;  Repair Anal Fissure/sphinterotomy and excision rectal polypl  . BACK SURGERY  2000   "had 2 cages put in"  . BIOPSY PANCREAS  2010   benign  . HERNIA REPAIR  0626   umbilical  . KIDNEY STONES  2010 & 2008 & 2005  . LAPAROSCOPIC INCISIONAL / UMBILICAL / VENTRAL HERNIA REPAIR  07/08/11   VHR  . left rotator cuff  02/26/2011  . left shoulder bone spurs  2011  . LIPOMA EXCISION     "several; off back, arms"  . pancreatic tumor  2010  . right rotator cuff     2011  . TONSILLECTOMY  1967  . TOTAL KNEE ARTHROPLASTY Left 04/28/2017   Procedure: LEFT  TOTAL KNEE ARTHROPLASTY;  Surgeon: Paralee Cancel, MD;  [facility];  Service: Orthopedics;  Laterality: Left;  Marland Kitchen VASECTOMY  1994  . VENTRAL HERNIA REPAIR  07/08/2011   Procedure: LAPAROSCOPIC VENTRAL HERNIA;  Surgeon: Adin Hector, MD;  Location: [place];  Service: General;  Laterality: N/A;  Laparoscopic repair ventral hernia with mesh, Lysis of adhesions.    There were no vitals filed for this visit.       Subjective Assessment - 05/01/17 0936    Subjective Patient reports left knee pain for a number of years, he reports that he had a left TKR on 04/28/17.  He had a scope back in February.  He reports that he has been in "the worst pain ever"   Limitations Lifting;Standing;Walking;House hold activities   Patient Stated Goals have less pain, walk   Currently in Pain? Yes   Pain Score 8    Pain Location Knee   Pain Orientation Left   Pain Descriptors / Indicators Aching   Pain Type Surgical pain   Pain Onset More than a month ago   Pain Frequency Constant   Aggravating Factors  bending, walking, bearing weight pain a 10/10   Pain Relieving Factors taking pain meds and mm relaxers  at bes pain a 5/10   Effect of Pain on Daily Activities limits everything            [facility] PT Assessment - 05/01/17 0001      Assessment   Medical Diagnosis left TKR   Referring Provider Alvan Dame   Onset Date/Surgical Date 04/28/17   Prior Therapy no     Precautions   Precautions None   Precaution Comments back surgery 18 years ago     Balance Screen   Has the patient fallen in the past 6 months No   Has the patient had a decrease in activity level because of a fear of falling?  No   Is the patient reluctant to leave their home because of a fear of falling?  No     Home Environment   Additional Comments no stairs, a little bit of housework     Prior Function   Level of Independence Independent   Vocation Full time employment   Vocation Requirements sitting   Leisure walk, bike      Observation/Other Assessments-Edema    Edema Circumferential     Circumferential Edema   Circumferential - Right 44 cm   Circumferential - Left  50.5 cm     ROM / Strength   AROM / PROM / Strength AROM;PROM;Strength     AROM   AROM Assessment Site Knee   Right/Left Knee Left   Left Knee Extension 55   Left Knee Flexion 70     PROM   PROM Assessment Site Knee   Right/Left Knee Left   Left Knee Extension 12   Left Knee Flexion 75     Strength   Overall Strength Comments 1/5 quads, 4/5 hams     Palpation   Palpation comment significant swelling, ballotable patella, mild warmth to touch, very tender in the thigh where the tourniquet was     Ambulation/Gait   Gait Comments walker, step to gait, minimal weight bearing            Objective measurements completed on examination: See above findings.          [place] Adult PT Treatment/Exercise - 05/01/17 0001      Exercises   Exercises Knee/Hip     Knee/Hip Exercises: Aerobic   Nustep level 4 x 5 minutes     Modalities   Modalities Vasopneumatic     Vasopneumatic   Number Minutes Vasopneumatic  15 minutes   Vasopnuematic Location  Knee   Vasopneumatic Pressure Low   Vasopneumatic Temperature  40                PT Education - 05/01/17 1008    Education provided Yes   Education Details quad activation, low load long duration stretches   Person(s) Educated Patient   Methods Explanation;Demonstration;Handout;Tactile cues   Comprehension Verbalized understanding          PT Short Term Goals - 05/01/17 1011      PT SHORT TERM GOAL #1   Title independent with initial HEP   Time 2   Period Weeks   Status New           PT Long Term Goals - 05/01/17 1011      PT LONG TERM GOAL #1   Title decrease pain 50%   Time 8   Period Weeks   Status New     PT LONG TERM GOAL #2   Title walk without assistive device   Time 8  Period Weeks   Status New     PT LONG TERM GOAL #3   Title  increase AROM of the left knee to 5-115 degrees flexion   Time 8   Period Weeks   Status New     PT LONG TERM GOAL #4   Title go up and down stairs step over step                Plan - 05/01/17 1009    Clinical Impression Statement patient with long standing left knee pain, underwent a left TKR on 04/28/17, he has a lot of swelling, very poor quads, AROM was 50-70 degrees.  Gait is with FWW, slow step to gait.   Clinical Presentation Evolving   Clinical Presentation due to: TKR 2 days ago   Clinical Decision Making Low   Rehab Potential Good   PT Frequency 3x / week   PT Duration 8 weeks   PT Treatment/Interventions ADLs/Self Care Home Management;Cryotherapy;Electrical Stimulation;Gait training;Stair training;Functional mobility training;Patient/family education;Balance training;Therapeutic exercise;Therapeutic activities;Neuromuscular re-education;Manual techniques;Vasopneumatic Device   PT Next Visit Plan slowly progress as tolerated, quad activation   Consulted and Agree with Plan of Care Patient      Patient will benefit from skilled therapeutic intervention in order to improve the following deficits and impairments:  Abnormal gait, Cardiopulmonary status limiting activity, Decreased activity tolerance, Decreased balance, Decreased mobility, Decreased strength, Increased edema, Decreased scar mobility, Pain, Decreased range of motion, Difficulty walking  Visit Diagnosis: Acute pain of left knee - Plan: PT plan of care cert/re-cert  Stiffness of left knee, not elsewhere classified - Plan: PT plan of care cert/re-cert  Difficulty in walking, not elsewhere classified - Plan: PT plan of care cert/re-cert  Localized swelling, mass and lump, left lower limb - Plan: PT plan of care cert/re-cert     Problem List Patient Active Problem List   Diagnosis Date Noted  . S/P left TKA 04/28/2017  . S/P total knee replacement 04/28/2017  . Situational anxiety 03/17/2016  . Low  testosterone 03/13/2015  . Osteoarthritis, multiple sites 08/22/2013  . Obesity (BMI 30-39.9) 05/08/2013  . 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  . Liver nodule 10/07/2012  . History of kidney stones 08/27/2012  . 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 Rotator cuff tear, left 01/23/2011  . Hyperlipidemia 10/11/2010  . BURSITIS, LEFT HIP 05/20/2010  . Controlled type 2 diabetes mellitus without complication ([place]) 33/54/5625  . CHRONIC TENSION TYPE HEADACHE 03/30/2009  . ELEVATED BLOOD PRESSURE 03/30/2009  . SHOULDER PAIN, LEFT 10/20/2008  . CONTUSION OF KNEE 10/20/2008  . ABNORMAL FINDINGS GI TRACT 06/23/2008    Sumner Boast., PT 05/01/2017, 10:14 AM  [address] [place], [place], [address] Phone: [phone]   Fax:  [phone]  Name: JERIAN MORAIS MRN: [id number] Date of Birth: [birth year]

## 2017-05-02 ENCOUNTER — Encounter: Payer: Self-pay | Admitting: Family Medicine

## 2017-05-04 ENCOUNTER — Ambulatory Visit: Payer: BLUE CROSS/BLUE SHIELD | Attending: Orthopedic Surgery | Admitting: Physical Therapy

## 2017-05-04 ENCOUNTER — Encounter: Payer: Self-pay | Admitting: Physical Therapy

## 2017-05-04 DIAGNOSIS — R262 Difficulty in walking, not elsewhere classified: Secondary | ICD-10-CM

## 2017-05-04 DIAGNOSIS — R2242 Localized swelling, mass and lump, left lower limb: Secondary | ICD-10-CM | POA: Diagnosis not present

## 2017-05-04 DIAGNOSIS — M25562 Pain in left knee: Secondary | ICD-10-CM

## 2017-05-04 DIAGNOSIS — M25662 Stiffness of left knee, not elsewhere classified: Secondary | ICD-10-CM | POA: Diagnosis not present

## 2017-05-04 MED ORDER — GLUCOSE BLOOD VI STRP: ORAL_STRIP | 3 refills | Status: DC

## 2017-05-04 NOTE — Therapy (Signed)
[address] [place], [place], [address] Phone: [phone]   Fax:  [phone]  Physical Therapy Treatment  Patient Details  Name: [name] MRN: [id number] Date of Birth: 09/07/1960 Referring Provider: Alvan Dame  Encounter Date: 05/04/2017      PT End of Session - 05/04/17 0839    Visit Number 2   Date for PT Re-Evaluation 07/01/17   PT Start Time 0800   PT Stop Time 0855   PT Time Calculation (min) 55 min   Activity Tolerance Patient tolerated treatment well   Behavior During Therapy [facility] for tasks assessed/performed      Past Medical History:  Diagnosis Date  . Arthritis   . BURSITIS, LEFT HIP 05/20/2010  . Cancer [facility]) 2010   pancreatic cancer  . Chronic kidney disease    stones; 13 stones, currently has 2 stones   . Claustrophobia    "take Lorazepam if I have to fly"  . DIAB W/O COMP TYPE II/UNS NOT STATED UNCNTRL 03/30/2009  . ELEVATED BLOOD PRESSURE 03/30/2009  . Fissure, anal    occ bleeds still  . Headache(784.0)    "only when I was working; had headaches and migraines"  . Hyperlipidemia 10/11/2010  . SHOULDER PAIN, LEFT 10/20/2008    Past Surgical History:  Procedure Laterality Date  . ANAL FISSURE REPAIR  07/28/2011   Procedure: ANAL FISSURE REPAIR;  Surgeon: Adin Hector, MD;  [facility];  Service: General;  Laterality: N/A;  Repair Anal Fissure/sphinterotomy and excision rectal polypl  . BACK SURGERY  2000   "had 2 cages put in"  . BIOPSY PANCREAS  2010   benign  . HERNIA REPAIR  6629   umbilical  . KIDNEY STONES  2010 & 2008 & 2005  . LAPAROSCOPIC INCISIONAL / UMBILICAL / VENTRAL HERNIA REPAIR  07/08/11   VHR  . left rotator cuff  02/26/2011  . left shoulder bone spurs  2011  . LIPOMA EXCISION     "several; off back, arms"  . pancreatic tumor  2010  . right rotator cuff     2011  . TONSILLECTOMY  1967  . TOTAL KNEE ARTHROPLASTY Left 04/28/2017   Procedure: LEFT  TOTAL KNEE ARTHROPLASTY;  Surgeon: Paralee Cancel, MD;  [facility];  Service: Orthopedics;  Laterality: Left;  Marland Kitchen VASECTOMY  1994  . VENTRAL HERNIA REPAIR  07/08/2011   Procedure: LAPAROSCOPIC VENTRAL HERNIA;  Surgeon: Adin Hector, MD;  Location: [place];  Service: General;  Laterality: N/A;  Laparoscopic repair ventral hernia with mesh, Lysis of adhesions.    There were no vitals filed for this visit.      Subjective Assessment - 05/04/17 0800    Subjective "I think Im getting more flexibility"   Currently in Pain? Yes   Pain Score 3    Pain Location Knee                         [facility] Adult PT Treatment/Exercise - 05/04/17 0001      Knee/Hip Exercises: Aerobic   Nustep level 4 x 6 minutes     Knee/Hip Exercises: Standing   Hip Flexion Left;2 sets;10 reps;Knee bent  with RW    Other Standing Knee Exercises TKE yellow 2x10     Knee/Hip Exercises: Seated   Long Arc Quad Left;5 reps;3 sets;4 sets   Other Seated Knee/Hip Exercises Quad sets x10   Hamstring Curl Right;3  sets;5 reps     Knee/Hip Exercises: Supine   Short Arc Quad Sets Left;1 set;10 reps  min assist     Modalities   Modalities Vasopneumatic     Vasopneumatic   Number Minutes Vasopneumatic  15 minutes   Vasopnuematic Location  Knee   Vasopneumatic Pressure Medium   Vasopneumatic Temperature  40     Manual Therapy   Manual Therapy Passive ROM   Passive ROM L knee flex and ext                  PT Short Term Goals - 05/01/17 1011      PT SHORT TERM GOAL #1   Title independent with initial HEP   Time 2   Period Weeks   Status New           PT Long Term Goals - 05/01/17 1011      PT LONG TERM GOAL #1   Title decrease pain 50%   Time 8   Period Weeks   Status New     PT LONG TERM GOAL #2   Title walk without assistive device   Time 8   Period Weeks   Status New     PT LONG TERM GOAL #3   Title increase AROM of the left knee to 5-115 degrees flexion    Time 8   Period Weeks   Status New     PT LONG TERM GOAL #4   Title go up and down stairs step over step               Plan - 05/04/17 0839    Clinical Impression Statement Pt tolerated an initial progression well, some reports on increase pain at end range of L knee flexion during MT. Little ROM achieved with LAQ due to decrease quad activation.    Rehab Potential Good   PT Frequency 3x / week   PT Duration 8 weeks   PT Treatment/Interventions ADLs/Self Care Home Management;Cryotherapy;Electrical Stimulation;Gait training;Stair training;Functional mobility training;Patient/family education;Balance training;Therapeutic exercise;Therapeutic activities;Neuromuscular re-education;Manual techniques;Vasopneumatic Device   PT Next Visit Plan slowly progress as tolerated, quad activation      Patient will benefit from skilled therapeutic intervention in order to improve the following deficits and impairments:  Abnormal gait, Cardiopulmonary status limiting activity, Decreased activity tolerance, Decreased balance, Decreased mobility, Decreased strength, Increased edema, Decreased scar mobility, Pain, Decreased range of motion, Difficulty walking  Visit Diagnosis: Stiffness of left knee, not elsewhere classified  Acute pain of left knee  Difficulty in walking, not elsewhere classified  Localized swelling, mass and lump, left lower limb     Problem List Patient Active Problem List   Diagnosis Date Noted  . S/P left TKA 04/28/2017  . S/P total knee replacement 04/28/2017  . Situational anxiety 03/17/2016  . Low testosterone 03/13/2015  . Osteoarthritis, multiple sites 08/22/2013  . Obesity (BMI 30-39.9) 05/08/2013  . 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  . Liver nodule 10/07/2012  . History of kidney stones 08/27/2012  . 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 Rotator cuff tear, left 01/23/2011  . Hyperlipidemia 10/11/2010  . BURSITIS, LEFT HIP 05/20/2010  .  Controlled type 2 diabetes mellitus without complication ([place]) 43/32/9518  . CHRONIC TENSION TYPE HEADACHE 03/30/2009  . ELEVATED BLOOD PRESSURE 03/30/2009  . SHOULDER PAIN, LEFT 10/20/2008  . CONTUSION OF KNEE 10/20/2008  . ABNORMAL FINDINGS GI TRACT 06/23/2008    Scot Jun 05/04/2017, 8:42 AM  [address] [place], [place], [address] Phone: [phone]   Fax:  [phone]  Name: [name] MRN: [id number] Date of Birth: 12-24-1960

## 2017-05-06 ENCOUNTER — Encounter: Payer: Self-pay | Admitting: Physical Therapy

## 2017-05-06 ENCOUNTER — Ambulatory Visit: Payer: BLUE CROSS/BLUE SHIELD | Admitting: Physical Therapy

## 2017-05-06 DIAGNOSIS — M25662 Stiffness of left knee, not elsewhere classified: Secondary | ICD-10-CM

## 2017-05-06 DIAGNOSIS — R262 Difficulty in walking, not elsewhere classified: Secondary | ICD-10-CM

## 2017-05-06 DIAGNOSIS — R2242 Localized swelling, mass and lump, left lower limb: Secondary | ICD-10-CM | POA: Diagnosis not present

## 2017-05-06 DIAGNOSIS — M25562 Pain in left knee: Secondary | ICD-10-CM

## 2017-05-06 NOTE — Therapy (Signed)
[address] [place], [place], [address] Phone: [phone]   Fax:  [phone]  Physical Therapy Treatment  Patient Details  Name: [name] MRN: [id number] Date of Birth: [birth year] Referring Provider: Alvan Dame  Encounter Date: 05/06/2017      PT End of Session - 05/06/17 0843    Visit Number 3   Date for PT Re-Evaluation 07/01/17   PT Start Time 0800   PT Stop Time 0857   PT Time Calculation (min) 57 min   Activity Tolerance Patient tolerated treatment well   Behavior During Therapy [facility] for tasks assessed/performed      Past Medical History:  Diagnosis Date  . Arthritis   . BURSITIS, LEFT HIP 05/20/2010  . Cancer [facility]) 2010   pancreatic cancer  . Chronic kidney disease    stones; 13 stones, currently has 2 stones   . Claustrophobia    "take Lorazepam if I have to fly"  . DIAB W/O COMP TYPE II/UNS NOT STATED UNCNTRL 03/30/2009  . ELEVATED BLOOD PRESSURE 03/30/2009  . Fissure, anal    occ bleeds still  . Headache(784.0)    "only when I was working; had headaches and migraines"  . Hyperlipidemia 10/11/2010  . SHOULDER PAIN, LEFT 10/20/2008    Past Surgical History:  Procedure Laterality Date  . ANAL FISSURE REPAIR  07/28/2011   Procedure: ANAL FISSURE REPAIR;  Surgeon: Adin Hector, MD;  [facility];  Service: General;  Laterality: N/A;  Repair Anal Fissure/sphinterotomy and excision rectal polypl  . BACK SURGERY  2000   "had 2 cages put in"  . BIOPSY PANCREAS  2010   benign  . HERNIA REPAIR  8250   umbilical  . KIDNEY STONES  2010 & 2008 & 2005  . LAPAROSCOPIC INCISIONAL / UMBILICAL / VENTRAL HERNIA REPAIR  07/08/11   VHR  . left rotator cuff  02/26/2011  . left shoulder bone spurs  2011  . LIPOMA EXCISION     "several; off back, arms"  . pancreatic tumor  2010  . right rotator cuff     2011  . TONSILLECTOMY  1967  . TOTAL KNEE ARTHROPLASTY Left 04/28/2017   Procedure: LEFT  TOTAL KNEE ARTHROPLASTY;  Surgeon: Paralee Cancel, MD;  [facility];  Service: Orthopedics;  Laterality: Left;  Marland Kitchen VASECTOMY  1994  . VENTRAL HERNIA REPAIR  07/08/2011   Procedure: LAPAROSCOPIC VENTRAL HERNIA;  Surgeon: Adin Hector, MD;  Location: [place];  Service: General;  Laterality: N/A;  Laparoscopic repair ventral hernia with mesh, Lysis of adhesions.    There were no vitals filed for this visit.      Subjective Assessment - 05/06/17 0759    Subjective "Not bad, a little sore"   Currently in Pain? Yes   Pain Score 4    Pain Location Knee   Pain Orientation Right                         [facility] Adult PT Treatment/Exercise - 05/06/17 0001      Knee/Hip Exercises: Aerobic   Nustep level 4 x 6 minutes     Knee/Hip Exercises: Machines for Strengthening   Other Machine fitter presses 2 blue 2x15     Knee/Hip Exercises: Seated   Long Arc Quad Left;2 sets;10 reps  little ROM   Other Seated Knee/Hip Exercises Quad sets 2x10   Hamstring Curl Right;3 sets;10 reps  Knee/Hip Exercises: Supine   Quad Sets 2 sets;Left;10 reps     Modalities   Modalities Vasopneumatic     Vasopneumatic   Number Minutes Vasopneumatic  15 minutes   Vasopnuematic Location  Knee   Vasopneumatic Pressure Medium   Vasopneumatic Temperature  33     Manual Therapy   Manual Therapy Passive ROM   Passive ROM L knee flex and ext                  PT Short Term Goals - 05/01/17 1011      PT SHORT TERM GOAL #1   Title independent with initial HEP   Time 2   Period Weeks   Status New           PT Long Term Goals - 05/01/17 1011      PT LONG TERM GOAL #1   Title decrease pain 50%   Time 8   Period Weeks   Status New     PT LONG TERM GOAL #2   Title walk without assistive device   Time 8   Period Weeks   Status New     PT LONG TERM GOAL #3   Title increase AROM of the left knee to 5-115 degrees flexion   Time 8   Period Weeks   Status New      PT LONG TERM GOAL #4   Title go up and down stairs step over step               Plan - 05/06/17 0844    Clinical Impression Statement abel to passively move pt L knee to 90 deg of flexion. Abel to achieve little quad shivering with quad sets. Pain reported at the end range of L knee flexion during MT. LAQ ROM ~80-60 deg.   Rehab Potential Good   PT Frequency 3x / week   PT Duration 8 weeks   PT Treatment/Interventions ADLs/Self Care Home Management;Cryotherapy;Electrical Stimulation;Gait training;Stair training;Functional mobility training;Patient/family education;Balance training;Therapeutic exercise;Therapeutic activities;Neuromuscular re-education;Manual techniques;Vasopneumatic Device   PT Next Visit Plan slowly progress as tolerated, quad activation      Patient will benefit from skilled therapeutic intervention in order to improve the following deficits and impairments:  Abnormal gait, Cardiopulmonary status limiting activity, Decreased activity tolerance, Decreased balance, Decreased mobility, Decreased strength, Increased edema, Decreased scar mobility, Pain, Decreased range of motion, Difficulty walking  Visit Diagnosis: Stiffness of left knee, not elsewhere classified  Acute pain of left knee  Difficulty in walking, not elsewhere classified  Localized swelling, mass and lump, left lower limb     Problem List Patient Active Problem List   Diagnosis Date Noted  . S/P left TKA 04/28/2017  . S/P total knee replacement 04/28/2017  . Situational anxiety 03/17/2016  . Low testosterone 03/13/2015  . Osteoarthritis, multiple sites 08/22/2013  . Obesity (BMI 30-39.9) 05/08/2013  . 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  . Liver nodule 10/07/2012  . History of kidney stones 08/27/2012  . 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 Rotator cuff tear, left 01/23/2011  . Hyperlipidemia 10/11/2010  . BURSITIS, LEFT HIP 05/20/2010  . Controlled type 2 diabetes  mellitus without complication ([place]) 70/96/2836  . CHRONIC TENSION TYPE HEADACHE 03/30/2009  . ELEVATED BLOOD PRESSURE 03/30/2009  . SHOULDER PAIN, LEFT 10/20/2008  . CONTUSION OF KNEE 10/20/2008  . ABNORMAL FINDINGS GI TRACT 06/23/2008    Scot Jun, PTA 05/06/2017, 8:48 AM  [facility] [phone] [address]  [place], [place], [address] Phone: [phone]   Fax:  [phone]  Name: DARRICK GREENLAW MRN: [id number] Date of Birth: 11/17/1960

## 2017-05-08 ENCOUNTER — Encounter: Payer: Self-pay | Admitting: Physical Therapy

## 2017-05-08 ENCOUNTER — Ambulatory Visit: Payer: BLUE CROSS/BLUE SHIELD | Admitting: Physical Therapy

## 2017-05-08 DIAGNOSIS — M25662 Stiffness of left knee, not elsewhere classified: Secondary | ICD-10-CM | POA: Diagnosis not present

## 2017-05-08 DIAGNOSIS — M25562 Pain in left knee: Secondary | ICD-10-CM | POA: Diagnosis not present

## 2017-05-08 DIAGNOSIS — R262 Difficulty in walking, not elsewhere classified: Secondary | ICD-10-CM | POA: Diagnosis not present

## 2017-05-08 DIAGNOSIS — R2242 Localized swelling, mass and lump, left lower limb: Secondary | ICD-10-CM | POA: Diagnosis not present

## 2017-05-08 NOTE — Therapy (Signed)
[address] [place], [place], [address] Phone: [phone]   Fax:  [phone]  Physical Therapy Treatment  Patient Details  Name: [name] MRN: [id number] Date of Birth: Oct 29, 1960 Referring Provider: Alvan Dame  Encounter Date: 05/08/2017      PT End of Session - 05/08/17 0848    Visit Number 4   Date for PT Re-Evaluation 07/01/17   PT Start Time 0759   PT Stop Time 0901   PT Time Calculation (min) 62 min   Activity Tolerance Patient tolerated treatment well      Past Medical History:  Diagnosis Date  . Arthritis   . BURSITIS, LEFT HIP 05/20/2010  . Cancer [facility]) 2010   pancreatic cancer  . Chronic kidney disease    stones; 13 stones, currently has 2 stones   . Claustrophobia    "take Lorazepam if I have to fly"  . DIAB W/O COMP TYPE II/UNS NOT STATED UNCNTRL 03/30/2009  . ELEVATED BLOOD PRESSURE 03/30/2009  . Fissure, anal    occ bleeds still  . Headache(784.0)    "only when I was working; had headaches and migraines"  . Hyperlipidemia 10/11/2010  . SHOULDER PAIN, LEFT 10/20/2008    Past Surgical History:  Procedure Laterality Date  . ANAL FISSURE REPAIR  07/28/2011   Procedure: ANAL FISSURE REPAIR;  Surgeon: Adin Hector, MD;  Location: [facility];  Service: General;  Laterality: N/A;  Repair Anal Fissure/sphinterotomy and excision rectal polypl  . BACK SURGERY  2000   "had 2 cages put in"  . BIOPSY PANCREAS  2010   benign  . HERNIA REPAIR  2355   umbilical  . KIDNEY STONES  2010 & 2008 & 2005  . LAPAROSCOPIC INCISIONAL / UMBILICAL / VENTRAL HERNIA REPAIR  07/08/11   VHR  . left rotator cuff  02/26/2011  . left shoulder bone spurs  2011  . LIPOMA EXCISION     "several; off back, arms"  . pancreatic tumor  2010  . right rotator cuff     2011  . TONSILLECTOMY  1967  . TOTAL KNEE ARTHROPLASTY Left 04/28/2017   Procedure: LEFT TOTAL KNEE ARTHROPLASTY;  Surgeon: Paralee Cancel, MD;   Location: [facility];  Service: Orthopedics;  Laterality: Left;  Marland Kitchen VASECTOMY  1994  . VENTRAL HERNIA REPAIR  07/08/2011   Procedure: LAPAROSCOPIC VENTRAL HERNIA;  Surgeon: Adin Hector, MD;  Location: [facility];  Service: General;  Laterality: N/A;  Laparoscopic repair ventral hernia with mesh, Lysis of adhesions.    There were no vitals filed for this visit.      Subjective Assessment - 05/08/17 0803    Subjective "My knee hurt" Pt reports that he did a lot with his knee yesterday causing some pain and swelling.   Currently in Pain? Yes   Pain Score 5    Pain Location Knee   Pain Orientation Right                         [facility] Adult PT Treatment/Exercise - 05/08/17 0001      Knee/Hip Exercises: Aerobic   Nustep level 4 x 7 minutes     Knee/Hip Exercises: Machines for Strengthening   Cybex Leg Press 20lb 2x10; TKE LLE 20ld 3x5   Other Machine fitter presses 2 blue 2x10     Knee/Hip Exercises: Standing   Other Standing Knee Exercises TKE red 2x15  Knee/Hip Exercises: Seated   Hamstring Curl Right;10 reps;2 sets     Knee/Hip Exercises: Supine   Quad Sets 2 sets;Left;10 reps     Modalities   Modalities Vasopneumatic     Vasopneumatic   Number Minutes Vasopneumatic  15 minutes   Vasopnuematic Location  Knee   Vasopneumatic Pressure Medium   Vasopneumatic Temperature  33     Manual Therapy   Manual Therapy Passive ROM   Passive ROM L knee flex and ext                  PT Short Term Goals - 05/01/17 1011      PT SHORT TERM GOAL #1   Title independent with initial HEP   Time 2   Period Weeks   Status New           PT Long Term Goals - 05/01/17 1011      PT LONG TERM GOAL #1   Title decrease pain 50%   Time 8   Period Weeks   Status New     PT LONG TERM GOAL #2   Title walk without assistive device   Time 8   Period Weeks   Status New     PT LONG TERM GOAL #3   Title increase AROM of the left knee to 5-115 degrees flexion    Time 8   Period Weeks   Status New     PT LONG TERM GOAL #4   Title go up and down stairs step over step               Plan - 05/08/17 0849    Clinical Impression Statement pt enters clinic reporting increase L knee soreness, Increase PROM noted today with MT. Pt has some R knee swelling indicated by ballotable patella. Again able to achieve some L quad quivering with all TKE exercises.   PT Frequency 3x / week   PT Duration 8 weeks   PT Treatment/Interventions ADLs/Self Care Home Management;Cryotherapy;Electrical Stimulation;Gait training;Stair training;Functional mobility training;Patient/family education;Balance training;Therapeutic exercise;Therapeutic activities;Neuromuscular re-education;Manual techniques;Vasopneumatic Device      Patient will benefit from skilled therapeutic intervention in order to improve the following deficits and impairments:  Abnormal gait, Cardiopulmonary status limiting activity, Decreased activity tolerance, Decreased balance, Decreased mobility, Decreased strength, Increased edema, Decreased scar mobility, Pain, Decreased range of motion, Difficulty walking  Visit Diagnosis: Acute pain of left knee  Stiffness of left knee, not elsewhere classified  Difficulty in walking, not elsewhere classified  Localized swelling, mass and lump, left lower limb     Problem List Patient Active Problem List   Diagnosis Date Noted  . S/P left TKA 04/28/2017  . S/P total knee replacement 04/28/2017  . Situational anxiety 03/17/2016  . Low testosterone 03/13/2015  . Osteoarthritis, multiple sites 08/22/2013  . Obesity (BMI 30-39.9) 05/08/2013  . 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  . Liver nodule 10/07/2012  . History of kidney stones 08/27/2012  . 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 Rotator cuff tear, left 01/23/2011  . Hyperlipidemia 10/11/2010  . BURSITIS, LEFT HIP 05/20/2010  . Controlled type 2 diabetes mellitus without  complication ([place]) 34/19/3790  . CHRONIC TENSION TYPE HEADACHE 03/30/2009  . ELEVATED BLOOD PRESSURE 03/30/2009  . SHOULDER PAIN, LEFT 10/20/2008  . CONTUSION OF KNEE 10/20/2008  . ABNORMAL FINDINGS GI TRACT 06/23/2008    Scot Jun 05/08/2017, 8:52 AM  [place] [place],  [place], [address] Phone: [phone]   Fax:  [phone]  Name: [name] MRN: [id number] Date of Birth: 1960/10/11

## 2017-05-11 ENCOUNTER — Other Ambulatory Visit: Payer: Self-pay | Admitting: *Deleted

## 2017-05-11 ENCOUNTER — Ambulatory Visit: Payer: BLUE CROSS/BLUE SHIELD | Admitting: Physical Therapy

## 2017-05-11 ENCOUNTER — Encounter: Payer: Self-pay | Admitting: Family Medicine

## 2017-05-11 ENCOUNTER — Encounter: Payer: Self-pay | Admitting: Physical Therapy

## 2017-05-11 DIAGNOSIS — R2242 Localized swelling, mass and lump, left lower limb: Secondary | ICD-10-CM | POA: Diagnosis not present

## 2017-05-11 DIAGNOSIS — R262 Difficulty in walking, not elsewhere classified: Secondary | ICD-10-CM

## 2017-05-11 DIAGNOSIS — M25662 Stiffness of left knee, not elsewhere classified: Secondary | ICD-10-CM | POA: Diagnosis not present

## 2017-05-11 DIAGNOSIS — M25562 Pain in left knee: Secondary | ICD-10-CM

## 2017-05-11 MED ORDER — LORAZEPAM 1 MG PO TABS: 1.0000 mg | ORAL_TABLET | Freq: Four times a day (QID) | ORAL | 0 refills | Status: DC | PRN

## 2017-05-11 NOTE — Therapy (Signed)
[address] [place], [place], [address] Phone: [phone]   Fax:  [phone]  Physical Therapy Treatment  Patient Details  Name: [name] MRN: [id number] Date of Birth: 07-27-1961 Referring Provider: Alvan Dame  Encounter Date: 05/11/2017      PT End of Session - 05/11/17 1428    Visit Number 5   Date for PT Re-Evaluation 07/01/17   PT Start Time 2536   PT Stop Time 1442   PT Time Calculation (min) 67 min   Activity Tolerance Patient tolerated treatment well   Behavior During Therapy [facility] for tasks assessed/performed      Past Medical History:  Diagnosis Date  . Arthritis   . BURSITIS, LEFT HIP 05/20/2010  . Cancer [facility]) 2010   pancreatic cancer  . Chronic kidney disease    stones; 13 stones, currently has 2 stones   . Claustrophobia    "take Lorazepam if I have to fly"  . DIAB W/O COMP TYPE II/UNS NOT STATED UNCNTRL 03/30/2009  . ELEVATED BLOOD PRESSURE 03/30/2009  . Fissure, anal    occ bleeds still  . Headache(784.0)    "only when I was working; had headaches and migraines"  . Hyperlipidemia 10/11/2010  . SHOULDER PAIN, LEFT 10/20/2008    Past Surgical History:  Procedure Laterality Date  . ANAL FISSURE REPAIR  07/28/2011   Procedure: ANAL FISSURE REPAIR;  Surgeon: Adin Hector, MD;  [facility];  Service: General;  Laterality: N/A;  Repair Anal Fissure/sphinterotomy and excision rectal polypl  . BACK SURGERY  2000   "had 2 cages put in"  . BIOPSY PANCREAS  2010   benign  . HERNIA REPAIR  6440   umbilical  . KIDNEY STONES  2010 & 2008 & 2005  . LAPAROSCOPIC INCISIONAL / UMBILICAL / VENTRAL HERNIA REPAIR  07/08/11   VHR  . left rotator cuff  02/26/2011  . left shoulder bone spurs  2011  . LIPOMA EXCISION     "several; off back, arms"  . pancreatic tumor  2010  . right rotator cuff     2011  . TONSILLECTOMY  1967  . TOTAL KNEE ARTHROPLASTY Left 04/28/2017   Procedure: LEFT  TOTAL KNEE ARTHROPLASTY;  Surgeon: Paralee Cancel, MD;  [facility];  Service: Orthopedics;  Laterality: Left;  Marland Kitchen VASECTOMY  1994  . VENTRAL HERNIA REPAIR  07/08/2011   Procedure: LAPAROSCOPIC VENTRAL HERNIA;  Surgeon: Adin Hector, MD;  Location: [facility];  Service: General;  Laterality: N/A;  Laparoscopic repair ventral hernia with mesh, Lysis of adhesions.    There were no vitals filed for this visit.      Subjective Assessment - 05/11/17 1343    Subjective Pt just reports a little soreness   Currently in Pain? Yes   Pain Score 3             [facility] PT Assessment - 05/11/17 0001      AROM   Left Knee Flexion 110     PROM   Left Knee Flexion 120                     [facility] Adult PT Treatment/Exercise - 05/11/17 0001      Knee/Hip Exercises: Aerobic   Recumbent Bike Seat 11 x6   Nustep level 4 x 12 minutes     Knee/Hip Exercises: Machines for Strengthening   Cybex Leg Press 20lb 2x10, TKE RLE 20lb x5  green tband TKE resistance LLE     Knee/Hip Exercises: Standing   Other Standing Knee Exercises TKE red/green 2x15     Modalities   Modalities Vasopneumatic     Vasopneumatic   Number Minutes Vasopneumatic  15 minutes   Vasopnuematic Location  Knee   Vasopneumatic Pressure Medium   Vasopneumatic Temperature  33     Manual Therapy   Manual Therapy Passive ROM   Passive ROM L knee flex and ext                  PT Short Term Goals - 05/01/17 1011      PT SHORT TERM GOAL #1   Title independent with initial HEP   Time 2   Period Weeks   Status New           PT Long Term Goals - 05/01/17 1011      PT LONG TERM GOAL #1   Title decrease pain 50%   Time 8   Period Weeks   Status New     PT LONG TERM GOAL #2   Title walk without assistive device   Time 8   Period Weeks   Status New     PT LONG TERM GOAL #3   Title increase AROM of the left knee to 5-115 degrees flexion   Time 8   Period Weeks   Status New     PT  LONG TERM GOAL #4   Title go up and down stairs step over step               Plan - 05/11/17 1429    Clinical Impression Statement Best quad contraction with standing TKE. Pt has improved his L knee active flexion. Ballotable patella remains. cues provided to use LLE on leg press.   Rehab Potential Good   PT Frequency 3x / week   PT Duration 8 weeks   PT Treatment/Interventions ADLs/Self Care Home Management;Cryotherapy;Electrical Stimulation;Gait training;Stair training;Functional mobility training;Patient/family education;Balance training;Therapeutic exercise;Therapeutic activities;Neuromuscular re-education;Manual techniques;Vasopneumatic Device   PT Next Visit Plan slowly progress as tolerated, quad activation      Patient will benefit from skilled therapeutic intervention in order to improve the following deficits and impairments:  Abnormal gait, Cardiopulmonary status limiting activity, Decreased activity tolerance, Decreased balance, Decreased mobility, Decreased strength, Increased edema, Decreased scar mobility, Pain, Decreased range of motion, Difficulty walking  Visit Diagnosis: Stiffness of left knee, not elsewhere classified  Acute pain of left knee  Localized swelling, mass and lump, left lower limb  Difficulty in walking, not elsewhere classified     Problem List Patient Active Problem List   Diagnosis Date Noted  . S/P left TKA 04/28/2017  . S/P total knee replacement 04/28/2017  . Situational anxiety 03/17/2016  . Low testosterone 03/13/2015  . Osteoarthritis, multiple sites 08/22/2013  . Obesity (BMI 30-39.9) 05/08/2013  . 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  . Liver nodule 10/07/2012  . History of kidney stones 08/27/2012  . 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 Rotator cuff tear, left 01/23/2011  . Hyperlipidemia 10/11/2010  . BURSITIS, LEFT HIP 05/20/2010  . Controlled type 2 diabetes mellitus without complication ([place]) 01/60/1093   . CHRONIC TENSION TYPE HEADACHE 03/30/2009  . ELEVATED BLOOD PRESSURE 03/30/2009  . SHOULDER PAIN, LEFT 10/20/2008  . CONTUSION OF KNEE 10/20/2008  . ABNORMAL FINDINGS GI TRACT 06/23/2008    Scot Jun, PTA 05/11/2017, 2:34 PM  [place]  [place], [place], [address] Phone: [phone]   Fax:  [phone]  Name: KALI AMBLER MRN: [id number] Date of Birth: [birth year]

## 2017-05-11 NOTE — Telephone Encounter (Signed)
Rx done-see Mychart note.

## 2017-05-13 ENCOUNTER — Ambulatory Visit: Payer: BLUE CROSS/BLUE SHIELD | Admitting: Physical Therapy

## 2017-05-13 ENCOUNTER — Encounter: Payer: Self-pay | Admitting: Physical Therapy

## 2017-05-13 DIAGNOSIS — M25662 Stiffness of left knee, not elsewhere classified: Secondary | ICD-10-CM

## 2017-05-13 DIAGNOSIS — R262 Difficulty in walking, not elsewhere classified: Secondary | ICD-10-CM

## 2017-05-13 DIAGNOSIS — M25562 Pain in left knee: Secondary | ICD-10-CM | POA: Diagnosis not present

## 2017-05-13 DIAGNOSIS — R2242 Localized swelling, mass and lump, left lower limb: Secondary | ICD-10-CM

## 2017-05-13 NOTE — Therapy (Signed)
[address] [place], [place], [address] Phone: [phone]   Fax:  [phone]  Physical Therapy Treatment  Patient Details  Name: [name] MRN: [id number] Date of Birth: 05-08-1961 Referring Provider: Alvan Dame  Encounter Date: 05/13/2017      PT End of Session - 05/13/17 0841    Visit Number 6   Date for PT Re-Evaluation 07/01/17   PT Start Time 0758   PT Stop Time 0854   PT Time Calculation (min) 56 min   Activity Tolerance Patient tolerated treatment well   Behavior During Therapy [facility] for tasks assessed/performed      Past Medical History:  Diagnosis Date  . Arthritis   . BURSITIS, LEFT HIP 05/20/2010  . Cancer [facility]) 2010   pancreatic cancer  . Chronic kidney disease    stones; 13 stones, currently has 2 stones   . Claustrophobia    "take Lorazepam if I have to fly"  . DIAB W/O COMP TYPE II/UNS NOT STATED UNCNTRL 03/30/2009  . ELEVATED BLOOD PRESSURE 03/30/2009  . Fissure, anal    occ bleeds still  . Headache(784.0)    "only when I was working; had headaches and migraines"  . Hyperlipidemia 10/11/2010  . SHOULDER PAIN, LEFT 10/20/2008    Past Surgical History:  Procedure Laterality Date  . ANAL FISSURE REPAIR  07/28/2011   Procedure: ANAL FISSURE REPAIR;  Surgeon: Adin Hector, MD;  [facility];  Service: General;  Laterality: N/A;  Repair Anal Fissure/sphinterotomy and excision rectal polypl  . BACK SURGERY  2000   "had 2 cages put in"  . BIOPSY PANCREAS  2010   benign  . HERNIA REPAIR  3536   umbilical  . KIDNEY STONES  2010 & 2008 & 2005  . LAPAROSCOPIC INCISIONAL / UMBILICAL / VENTRAL HERNIA REPAIR  07/08/11   VHR  . left rotator cuff  02/26/2011  . left shoulder bone spurs  2011  . LIPOMA EXCISION     "several; off back, arms"  . pancreatic tumor  2010  . right rotator cuff     2011  . TONSILLECTOMY  1967  . TOTAL KNEE ARTHROPLASTY Left 04/28/2017   Procedure: LEFT  TOTAL KNEE ARTHROPLASTY;  Surgeon: Paralee Cancel, MD;  [facility];  Service: Orthopedics;  Laterality: Left;  Marland Kitchen VASECTOMY  1994  . VENTRAL HERNIA REPAIR  07/08/2011   Procedure: LAPAROSCOPIC VENTRAL HERNIA;  Surgeon: Adin Hector, MD;  Location: [facility];  Service: General;  Laterality: N/A;  Laparoscopic repair ventral hernia with mesh, Lysis of adhesions.    There were no vitals filed for this visit.      Subjective Assessment - 05/13/17 0805    Subjective "My knee is killing me, I cant get the swelling down today" Pt reports that he was up on his feel a lot yesterday   Currently in Pain? Yes   Pain Score 5    Pain Location Knee   Pain Orientation Right                         [facility] Adult PT Treatment/Exercise - 05/13/17 0001      Knee/Hip Exercises: Aerobic   Nustep x6 min L2 LE only      Knee/Hip Exercises: Seated   Long Arc Quad Left;2 sets;10 reps     Knee/Hip Exercises: Supine   Quad Sets 2 sets;Left;10 reps  some over pressure from  therapist    Short Arc Quad Sets Left;10 reps;3 sets   Straight Leg Raises 2 sets;Left;10 reps;Strengthening     Modalities   Modalities Vasopneumatic     Vasopneumatic   Number Minutes Vasopneumatic  15 minutes   Vasopnuematic Location  Knee   Vasopneumatic Pressure Medium   Vasopneumatic Temperature  33                  PT Short Term Goals - 05/13/17 1610      PT SHORT TERM GOAL #1   Title independent with initial HEP   Status Achieved           PT Long Term Goals - 05/01/17 1011      PT LONG TERM GOAL #1   Title decrease pain 50%   Time 8   Period Weeks   Status New     PT LONG TERM GOAL #2   Title walk without assistive device   Time 8   Period Weeks   Status New     PT LONG TERM GOAL #3   Title increase AROM of the left knee to 5-115 degrees flexion   Time 8   Period Weeks   Status New     PT LONG TERM GOAL #4   Title go up and down stairs step over step                Plan - 05/13/17 9604    Clinical Impression Statement Pt able to achieve a better quad contraction during today's interventions. PROM remains well. PT able to performed LAQ with greater motion.   Rehab Potential Good   PT Frequency 3x / week   PT Duration 8 weeks   PT Treatment/Interventions ADLs/Self Care Home Management;Cryotherapy;Electrical Stimulation;Gait training;Stair training;Functional mobility training;Patient/family education;Balance training;Therapeutic exercise;Therapeutic activities;Neuromuscular re-education;Manual techniques;Vasopneumatic Device   PT Next Visit Plan slowly progress as tolerated, quad activation      Patient will benefit from skilled therapeutic intervention in order to improve the following deficits and impairments:  Abnormal gait, Cardiopulmonary status limiting activity, Decreased activity tolerance, Decreased balance, Decreased mobility, Decreased strength, Increased edema, Decreased scar mobility, Pain, Decreased range of motion, Difficulty walking  Visit Diagnosis: Stiffness of left knee, not elsewhere classified  Acute pain of left knee  Localized swelling, mass and lump, left lower limb  Difficulty in walking, not elsewhere classified     Problem List Patient Active Problem List   Diagnosis Date Noted  . S/P left TKA 04/28/2017  . S/P total knee replacement 04/28/2017  . Situational anxiety 03/17/2016  . Low testosterone 03/13/2015  . Osteoarthritis, multiple sites 08/22/2013  . Obesity (BMI 30-39.9) 05/08/2013  . 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  . Liver nodule 10/07/2012  . History of kidney stones 08/27/2012  . 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 Rotator cuff tear, left 01/23/2011  . Hyperlipidemia 10/11/2010  . BURSITIS, LEFT HIP 05/20/2010  . Controlled type 2 diabetes mellitus without complication ([place]) 54/04/8118  . CHRONIC TENSION TYPE HEADACHE 03/30/2009  . ELEVATED BLOOD PRESSURE  03/30/2009  . SHOULDER PAIN, LEFT 10/20/2008  . CONTUSION OF KNEE 10/20/2008  . ABNORMAL FINDINGS GI TRACT 06/23/2008    Scot Jun, PTA 05/13/2017, 8:44 AM  [place] [address] [place], [place], [address] Phone: [phone]   Fax:  [phone]  Name: [name] MRN: [id number] Date of Birth: [birth year]

## 2017-05-15 ENCOUNTER — Ambulatory Visit: Payer: BLUE CROSS/BLUE SHIELD | Admitting: Physical Therapy

## 2017-05-15 ENCOUNTER — Encounter: Payer: Self-pay | Admitting: Physical Therapy

## 2017-05-15 DIAGNOSIS — M25562 Pain in left knee: Secondary | ICD-10-CM

## 2017-05-15 DIAGNOSIS — M25662 Stiffness of left knee, not elsewhere classified: Secondary | ICD-10-CM | POA: Diagnosis not present

## 2017-05-15 DIAGNOSIS — R2242 Localized swelling, mass and lump, left lower limb: Secondary | ICD-10-CM

## 2017-05-15 DIAGNOSIS — R262 Difficulty in walking, not elsewhere classified: Secondary | ICD-10-CM

## 2017-05-15 NOTE — Therapy (Signed)
[address] [place], [place], [address] Phone: [phone]   Fax:  [phone]  Physical Therapy Treatment  Patient Details  Name: [name] MRN: [id number] Date of Birth: 1961-05-29 Referring Provider: Alvan Dame  Encounter Date: 05/15/2017      PT End of Session - 05/15/17 0842    Visit Number 7   Date for PT Re-Evaluation 07/01/17   PT Start Time 0800   PT Stop Time 0851   PT Time Calculation (min) 51 min   Activity Tolerance Patient tolerated treatment well   Behavior During Therapy [facility] for tasks assessed/performed      Past Medical History:  Diagnosis Date  . Arthritis   . BURSITIS, LEFT HIP 05/20/2010  . Cancer [facility]) 2010   pancreatic cancer  . Chronic kidney disease    stones; 13 stones, currently has 2 stones   . Claustrophobia    "take Lorazepam if I have to fly"  . DIAB W/O COMP TYPE II/UNS NOT STATED UNCNTRL 03/30/2009  . ELEVATED BLOOD PRESSURE 03/30/2009  . Fissure, anal    occ bleeds still  . Headache(784.0)    "only when I was working; had headaches and migraines"  . Hyperlipidemia 10/11/2010  . SHOULDER PAIN, LEFT 10/20/2008    Past Surgical History:  Procedure Laterality Date  . ANAL FISSURE REPAIR  07/28/2011   Procedure: ANAL FISSURE REPAIR;  Surgeon: Adin Hector, MD;  Location: [facility];  Service: General;  Laterality: N/A;  Repair Anal Fissure/sphinterotomy and excision rectal polypl  . BACK SURGERY  2000   "had 2 cages put in"  . BIOPSY PANCREAS  2010   benign  . HERNIA REPAIR  [id number]   umbilical  . KIDNEY STONES  2010 & 2008 & 2005  . LAPAROSCOPIC INCISIONAL / UMBILICAL / VENTRAL HERNIA REPAIR  07/08/11   VHR  . left rotator cuff  02/26/2011  . left shoulder bone spurs  2011  . LIPOMA EXCISION     "several; off back, arms"  . pancreatic tumor  2010  . right rotator cuff     2011  . TONSILLECTOMY  1967  . TOTAL KNEE ARTHROPLASTY Left 04/28/2017   Procedure: LEFT  TOTAL KNEE ARTHROPLASTY;  Surgeon: Paralee Cancel, MD;  [facility];  Service: Orthopedics;  Laterality: Left;  Marland Kitchen VASECTOMY  1994  . VENTRAL HERNIA REPAIR  07/08/2011   Procedure: LAPAROSCOPIC VENTRAL HERNIA;  Surgeon: Adin Hector, MD;  Location: [employer];  Service: General;  Laterality: N/A;  Laparoscopic repair ventral hernia with mesh, Lysis of adhesions.    There were no vitals filed for this visit.      Subjective Assessment - 05/15/17 0801    Subjective "It is very still, I moved around a lot yesterday"   Currently in Pain? Yes   Pain Score 2    Pain Location Knee   Pain Orientation Right                         [facility] Adult PT Treatment/Exercise - 05/15/17 0001      Knee/Hip Exercises: Aerobic   Recumbent Bike Seat 11 x6   Nustep x6 min L2 LE only      Knee/Hip Exercises: Machines for Strengthening   Cybex Leg Press 20lb 2x15, LLE only 20lb TKE 3x5      Knee/Hip Exercises: Standing   Forward Step Up Left;1 set;5 reps;Hand Hold: 1;Step Height: 4"  Knee/Hip Exercises: Seated   Long Arc Quad Left;10 reps;1 set   Hamstring Curl 1 set;10 reps;Left   Hamstring Limitations green tband      Knee/Hip Exercises: Supine   Quad Sets 2 sets;Left;10 reps     Modalities   Modalities Cryotherapy     Cryotherapy   Number Minutes Cryotherapy 10 Minutes   Cryotherapy Location Knee   Type of Cryotherapy Ice pack                  PT Short Term Goals - 05/13/17 5009      PT SHORT TERM GOAL #1   Title independent with initial HEP   Status Achieved           PT Long Term Goals - 05/15/17 0843      PT LONG TERM GOAL #1   Title decrease pain 50%   Status Partially Met     PT LONG TERM GOAL #2   Title walk without assistive device   Status On-going     PT LONG TERM GOAL #3   Title increase AROM of the left knee to 5-115 degrees flexion   Status Partially Met     PT LONG TERM GOAL #4   Title go up and down stairs step over step    Status On-going               Plan - 05/15/17 0844    Clinical Impression Statement Pt with good L knee passive ROM, Pt  demos better quad contraction with SL on leg press. Unable to produce a good quad contraction on second set of quad sets due to fatigue.   Rehab Potential Good   PT Frequency 3x / week   PT Duration 8 weeks   PT Treatment/Interventions ADLs/Self Care Home Management;Cryotherapy;Electrical Stimulation;Gait training;Stair training;Functional mobility training;Patient/family education;Balance training;Therapeutic exercise;Therapeutic activities;Neuromuscular re-education;Manual techniques;Vasopneumatic Device   PT Next Visit Plan progress as tolerated, quad activation      Patient will benefit from skilled therapeutic intervention in order to improve the following deficits and impairments:  Abnormal gait, Cardiopulmonary status limiting activity, Decreased activity tolerance, Decreased balance, Decreased mobility, Decreased strength, Increased edema, Decreased scar mobility, Pain, Decreased range of motion, Difficulty walking  Visit Diagnosis: Stiffness of left knee, not elsewhere classified  Acute pain of left knee  Localized swelling, mass and lump, left lower limb  Difficulty in walking, not elsewhere classified     Problem List Patient Active Problem List   Diagnosis Date Noted  . S/P left TKA 04/28/2017  . S/P total knee replacement 04/28/2017  . Situational anxiety 03/17/2016  . Low testosterone 03/13/2015  . Osteoarthritis, multiple sites 08/22/2013  . Obesity (BMI 30-39.9) 05/08/2013  . 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  . Liver nodule 10/07/2012  . History of kidney stones 08/27/2012  . 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 Rotator cuff tear, left 01/23/2011  . Hyperlipidemia 10/11/2010  . BURSITIS, LEFT HIP 05/20/2010  . Controlled type 2 diabetes mellitus without complication ([place]) 38/18/2993  . CHRONIC TENSION TYPE HEADACHE  03/30/2009  . ELEVATED BLOOD PRESSURE 03/30/2009  . SHOULDER PAIN, LEFT 10/20/2008  . CONTUSION OF KNEE 10/20/2008  . ABNORMAL FINDINGS GI TRACT 06/23/2008    Scot Jun 05/15/2017, 8:52 AM  [address] [place], [place], [address] Phone: [phone]   Fax:  [phone]  Name: [name] MRN: [id number] Date of Birth: 11-05-1960

## 2017-05-18 ENCOUNTER — Ambulatory Visit: Payer: BLUE CROSS/BLUE SHIELD | Admitting: Physical Therapy

## 2017-05-18 ENCOUNTER — Encounter: Payer: Self-pay | Admitting: Physical Therapy

## 2017-05-18 ENCOUNTER — Other Ambulatory Visit: Payer: Self-pay | Admitting: Family Medicine

## 2017-05-18 DIAGNOSIS — M25662 Stiffness of left knee, not elsewhere classified: Secondary | ICD-10-CM | POA: Diagnosis not present

## 2017-05-18 DIAGNOSIS — Z96652 Presence of left artificial knee joint: Secondary | ICD-10-CM | POA: Diagnosis not present

## 2017-05-18 DIAGNOSIS — M25562 Pain in left knee: Secondary | ICD-10-CM

## 2017-05-18 DIAGNOSIS — Z471 Aftercare following joint replacement surgery: Secondary | ICD-10-CM | POA: Diagnosis not present

## 2017-05-18 DIAGNOSIS — R2242 Localized swelling, mass and lump, left lower limb: Secondary | ICD-10-CM

## 2017-05-18 DIAGNOSIS — R262 Difficulty in walking, not elsewhere classified: Secondary | ICD-10-CM

## 2017-05-18 NOTE — Therapy (Signed)
[address] [place], [place], [address] Phone: [phone]   Fax:  [phone]  Physical Therapy Treatment  Patient Details  Name: [name] MRN: [id number] Date of Birth: 17-May-1961 Referring Provider: Alvan Dame  Encounter Date: 05/18/2017      PT End of Session - 05/18/17 0820    Visit Number 8   Date for PT Re-Evaluation 07/01/17   PT Start Time 0177   PT Stop Time 0837   PT Time Calculation (min) 40 min   Activity Tolerance Patient limited by pain   Behavior During Therapy [facility] for tasks assessed/performed      Past Medical History:  Diagnosis Date  . Arthritis   . BURSITIS, LEFT HIP 05/20/2010  . Cancer [facility]) 2010   pancreatic cancer  . Chronic kidney disease    stones; 13 stones, currently has 2 stones   . Claustrophobia    "take Lorazepam if I have to fly"  . DIAB W/O COMP TYPE II/UNS NOT STATED UNCNTRL 03/30/2009  . ELEVATED BLOOD PRESSURE 03/30/2009  . Fissure, anal    occ bleeds still  . Headache(784.0)    "only when I was working; had headaches and migraines"  . Hyperlipidemia 10/11/2010  . SHOULDER PAIN, LEFT 10/20/2008    Past Surgical History:  Procedure Laterality Date  . ANAL FISSURE REPAIR  07/28/2011   Procedure: ANAL FISSURE REPAIR;  Surgeon: Adin Hector, MD;  [facility];  Service: General;  Laterality: N/A;  Repair Anal Fissure/sphinterotomy and excision rectal polypl  . BACK SURGERY  2000   "had 2 cages put in"  . BIOPSY PANCREAS  2010   benign  . HERNIA REPAIR  9390   umbilical  . KIDNEY STONES  2010 & 2008 & 2005  . LAPAROSCOPIC INCISIONAL / UMBILICAL / VENTRAL HERNIA REPAIR  07/08/11   VHR  . left rotator cuff  02/26/2011  . left shoulder bone spurs  2011  . LIPOMA EXCISION     "several; off back, arms"  . pancreatic tumor  2010  . right rotator cuff     2011  . TONSILLECTOMY  1967  . TOTAL KNEE ARTHROPLASTY Left 04/28/2017   Procedure: LEFT TOTAL KNEE  ARTHROPLASTY;  Surgeon: Paralee Cancel, MD;  [facility];  Service: Orthopedics;  Laterality: Left;  Marland Kitchen VASECTOMY  1994  . VENTRAL HERNIA REPAIR  07/08/2011   Procedure: LAPAROSCOPIC VENTRAL HERNIA;  Surgeon: Adin Hector, MD;  Location: [place];  Service: General;  Laterality: N/A;  Laparoscopic repair ventral hernia with mesh, Lysis of adhesions.    There were no vitals filed for this visit.      Subjective Assessment - 05/18/17 0804    Subjective Patient reports that on Friday he started having increased left lateral knee pain.  He is unsure of the cause reports that he thinks it was trying to do the steps.  Reports that he has pain a 4/10 with every step and pain with any side to side motions.   Currently in Pain? Yes   Pain Score 4    Pain Orientation Lateral;Right   Pain Descriptors / Indicators Aching;Sharp;Jabbing   Aggravating Factors  any weight bearing, and lateral motions will increase the pain, reports any pressure ont he lateral side also hurts            [facility] PT Assessment - 05/18/17 0001      AROM   Left Knee Extension 30  Left Knee Flexion 110     PROM   Left Knee Extension 10   Left Knee Flexion 117     Strength   Overall Strength Comments Still very weak quads, has quad lag, today is worse than previous due to the increased pain     Palpation   Palpation comment the knee is not red, or more swollen than previously.  There is no tenderness int he calf, negative Homan's                     [facility] Adult PT Treatment/Exercise - 05/18/17 0001      Ambulation/Gait   Gait Comments gait is with SPC, very antalgic with any weight bearing ont he left     Knee/Hip Exercises: Aerobic   Recumbent Bike Seat 11 x6   Nustep x6 min L2 LE only      Modalities   Modalities Electrical Stimulation     Electrical Stimulation   Electrical Stimulation Location left lateral knee   Electrical Stimulation Action IFC   Electrical Stimulation Parameters  supine   Electrical Stimulation Goals Pain                  PT Short Term Goals - 05/13/17 0842      PT SHORT TERM GOAL #1   Title independent with initial HEP   Status Achieved           PT Long Term Goals - 05/18/17 2956      PT LONG TERM GOAL #1   Title decrease pain 50%   Status Partially Met     PT LONG TERM GOAL #2   Title walk without assistive device   Status On-going     PT LONG TERM GOAL #3   Title increase AROM of the left knee to 5-115 degrees flexion   Status Partially Met     PT LONG TERM GOAL #4   Title go up and down stairs step over step   Status On-going               Plan - 05/18/17 0821    Clinical Impression Statement Patient overall has been doing well with his progression, he reports that he started having pain on Friday after trying to do step ups with [place] in PT.  He reports lateral knee pain with any weightbearing, and any pressure to the lateral knee, also pain with side to side type motions.  The knee is not red, has a negative Homan's sign.  He has good ROM passively.   PT Next Visit Plan please advise if we need to do anything different   Consulted and Agree with Plan of Care Patient      Patient will benefit from skilled therapeutic intervention in order to improve the following deficits and impairments:  Abnormal gait, Cardiopulmonary status limiting activity, Decreased activity tolerance, Decreased balance, Decreased mobility, Decreased strength, Increased edema, Decreased scar mobility, Pain, Decreased range of motion, Difficulty walking  Visit Diagnosis: Stiffness of left knee, not elsewhere classified  Acute pain of left knee  Localized swelling, mass and lump, left lower limb  Difficulty in walking, not elsewhere classified     Problem List Patient Active Problem List   Diagnosis Date Noted  . S/P left TKA 04/28/2017  . S/P total knee replacement 04/28/2017  . Situational anxiety 03/17/2016  . Low  testosterone 03/13/2015  . Osteoarthritis, multiple sites 08/22/2013  . Obesity (BMI 30-39.9) 05/08/2013  . Primary  pancreatic neuroendocrine tumor 10/07/2012  . Liver nodule 10/07/2012  . History of kidney stones 08/27/2012  . 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 Rotator cuff tear, left 01/23/2011  . Hyperlipidemia 10/11/2010  . BURSITIS, LEFT HIP 05/20/2010  . Controlled type 2 diabetes mellitus without complication ([place]) 16/57/9038  . CHRONIC TENSION TYPE HEADACHE 03/30/2009  . ELEVATED BLOOD PRESSURE 03/30/2009  . SHOULDER PAIN, LEFT 10/20/2008  . CONTUSION OF KNEE 10/20/2008  . ABNORMAL FINDINGS GI TRACT 06/23/2008    Sumner Boast., PT 05/18/2017, 8:24 AM  [facility] [address] [facility] [place], [place], [address] Phone: [phone]   Fax:  [phone]  Name: SAAD BUHL MRN: [id number] Date of Birth: [birth year]

## 2017-05-18 NOTE — Telephone Encounter (Signed)
Refill once 

## 2017-05-20 ENCOUNTER — Encounter: Payer: Self-pay | Admitting: Physical Therapy

## 2017-05-20 ENCOUNTER — Ambulatory Visit: Payer: BLUE CROSS/BLUE SHIELD | Admitting: Physical Therapy

## 2017-05-20 DIAGNOSIS — R262 Difficulty in walking, not elsewhere classified: Secondary | ICD-10-CM

## 2017-05-20 DIAGNOSIS — M25662 Stiffness of left knee, not elsewhere classified: Secondary | ICD-10-CM

## 2017-05-20 DIAGNOSIS — R2242 Localized swelling, mass and lump, left lower limb: Secondary | ICD-10-CM | POA: Diagnosis not present

## 2017-05-20 DIAGNOSIS — M25562 Pain in left knee: Secondary | ICD-10-CM

## 2017-05-20 NOTE — Therapy (Signed)
[address] [place], [place], [address] Phone: [phone]   Fax:  [phone]  Physical Therapy Treatment  Patient Details  Name: [name] MRN: [id number] Date of Birth: 10/30/1960 Referring Provider: Alvan Dame  Encounter Date: 05/20/2017      PT End of Session - 05/20/17 0845    Visit Number 9   Date for PT Re-Evaluation 07/01/17   PT Start Time 7858   PT Stop Time 0859   PT Time Calculation (min) 64 min   Activity Tolerance Patient tolerated treatment well   Behavior During Therapy [facility] for tasks assessed/performed      Past Medical History:  Diagnosis Date  . Arthritis   . BURSITIS, LEFT HIP 05/20/2010  . Cancer [facility]) 2010   pancreatic cancer  . Chronic kidney disease    stones; 13 stones, currently has 2 stones   . Claustrophobia    "take Lorazepam if I have to fly"  . DIAB W/O COMP TYPE II/UNS NOT STATED UNCNTRL 03/30/2009  . ELEVATED BLOOD PRESSURE 03/30/2009  . Fissure, anal    occ bleeds still  . Headache(784.0)    "only when I was working; had headaches and migraines"  . Hyperlipidemia 10/11/2010  . SHOULDER PAIN, LEFT 10/20/2008    Past Surgical History:  Procedure Laterality Date  . ANAL FISSURE REPAIR  07/28/2011   Procedure: ANAL FISSURE REPAIR;  Surgeon: Adin Hector, MD;  [facility];  Service: General;  Laterality: N/A;  Repair Anal Fissure/sphinterotomy and excision rectal polypl  . BACK SURGERY  2000   "had 2 cages put in"  . BIOPSY PANCREAS  2010   benign  . HERNIA REPAIR  8502   umbilical  . KIDNEY STONES  2010 & 2008 & 2005  . LAPAROSCOPIC INCISIONAL / UMBILICAL / VENTRAL HERNIA REPAIR  07/08/11   VHR  . left rotator cuff  02/26/2011  . left shoulder bone spurs  2011  . LIPOMA EXCISION     "several; off back, arms"  . pancreatic tumor  2010  . right rotator cuff     2011  . TONSILLECTOMY  1967  . TOTAL KNEE ARTHROPLASTY Left 04/28/2017   Procedure: LEFT  TOTAL KNEE ARTHROPLASTY;  Surgeon: Paralee Cancel, MD;  [facility];  Service: Orthopedics;  Laterality: Left;  Marland Kitchen VASECTOMY  1994  . VENTRAL HERNIA REPAIR  07/08/2011   Procedure: LAPAROSCOPIC VENTRAL HERNIA;  Surgeon: Adin Hector, MD;  Location: [place];  Service: General;  Laterality: N/A;  Laparoscopic repair ventral hernia with mesh, Lysis of adhesions.    There were no vitals filed for this visit.      Subjective Assessment - 05/20/17 0757    Subjective Pt reports that he went to MD and things are going fine. Pt stated that some nerves are waking up causing pain, but he should not be afraid to push it   Currently in Pain? Yes   Pain Score 4    Pain Location Knee   Pain Orientation Left            [facility] PT Assessment - 05/20/17 0001      AROM   Left Knee Extension 10   Left Knee Flexion 114                     [facility] Adult PT Treatment/Exercise - 05/20/17 0001      Knee/Hip Exercises: Aerobic   Recumbent Bike Seat  11 x6   Nustep x6 min L4 LE only      Knee/Hip Exercises: Machines for Strengthening   Cybex Knee Flexion 25lb 2x10 LLE    Cybex Leg Press 30lb 2x10, LLE only 20lb TKE 3x5      Knee/Hip Exercises: Standing   Forward Step Up Left;2 sets;10 reps;Hand Hold: 2   Other Standing Knee Exercises TKE ball on wall 2x10 3'' hold     Knee/Hip Exercises: Seated   Long Arc Quad Left;10 reps;2 sets  overpressure into ext on second set      Knee/Hip Exercises: Supine   Short Arc Quad Sets Left;1 set;15 reps  3'' hold   Straight Leg Raises Left;10 reps;Strengthening;1 set     Vasopneumatic   Number Minutes Vasopneumatic  15 minutes   Vasopnuematic Location  Knee   Vasopneumatic Pressure Low   Vasopneumatic Temperature  33                  PT Short Term Goals - 05/13/17 3817      PT SHORT TERM GOAL #1   Title independent with initial HEP   Status Achieved           PT Long Term Goals - 05/18/17 7116      PT LONG TERM  GOAL #1   Title decrease pain 50%   Status Partially Met     PT LONG TERM GOAL #2   Title walk without assistive device   Status On-going     PT LONG TERM GOAL #3   Title increase AROM of the left knee to 5-115 degrees flexion   Status Partially Met     PT LONG TERM GOAL #4   Title go up and down stairs step over step   Status On-going               Plan - 05/20/17 0845    Clinical Impression Statement Pt enters clinic amb without AD. Pt reports that the MD told him he could not hurt is knee so he is ready to push it a little more. Completed all exercises well still lacks TKE with LLE.   Rehab Potential Good   PT Frequency 3x / week   PT Duration 8 weeks   PT Next Visit Plan progress as tolerated      Patient will benefit from skilled therapeutic intervention in order to improve the following deficits and impairments:  Abnormal gait, Cardiopulmonary status limiting activity, Decreased activity tolerance, Decreased balance, Decreased mobility, Decreased strength, Increased edema, Decreased scar mobility, Pain, Decreased range of motion, Difficulty walking  Visit Diagnosis: Acute pain of left knee  Stiffness of left knee, not elsewhere classified  Localized swelling, mass and lump, left lower limb  Difficulty in walking, not elsewhere classified     Problem List Patient Active Problem List   Diagnosis Date Noted  . S/P left TKA 04/28/2017  . S/P total knee replacement 04/28/2017  . Situational anxiety 03/17/2016  . Low testosterone 03/13/2015  . Osteoarthritis, multiple sites 08/22/2013  . Obesity (BMI 30-39.9) 05/08/2013  . 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  . Liver nodule 10/07/2012  . History of kidney stones 08/27/2012  . 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 Rotator cuff tear, left 01/23/2011  . Hyperlipidemia 10/11/2010  . BURSITIS, LEFT HIP 05/20/2010  . Controlled type 2 diabetes mellitus without complication ([place]) 57/90/3833  .  CHRONIC TENSION TYPE HEADACHE 03/30/2009  . ELEVATED BLOOD PRESSURE 03/30/2009  . SHOULDER PAIN, LEFT 10/20/2008  .  CONTUSION OF KNEE 10/20/2008  . ABNORMAL FINDINGS GI TRACT 06/23/2008    Scot Jun, PTA 05/20/2017, 8:51 AM  [address] [place] [place], [place], [address] Phone: [phone]   Fax:  [phone]  Name: [name] MRN: [id number] Date of Birth: 1961-06-27

## 2017-05-22 ENCOUNTER — Encounter: Payer: Self-pay | Admitting: Physical Therapy

## 2017-05-22 ENCOUNTER — Ambulatory Visit: Payer: BLUE CROSS/BLUE SHIELD | Admitting: Physical Therapy

## 2017-05-22 DIAGNOSIS — R2242 Localized swelling, mass and lump, left lower limb: Secondary | ICD-10-CM

## 2017-05-22 DIAGNOSIS — M25562 Pain in left knee: Secondary | ICD-10-CM

## 2017-05-22 DIAGNOSIS — R262 Difficulty in walking, not elsewhere classified: Secondary | ICD-10-CM | POA: Diagnosis not present

## 2017-05-22 DIAGNOSIS — M25662 Stiffness of left knee, not elsewhere classified: Secondary | ICD-10-CM | POA: Diagnosis not present

## 2017-05-22 NOTE — Therapy (Signed)
[address] [place], [place], [address] Phone: [phone]   Fax:  [phone]  Physical Therapy Treatment  Patient Details  Name: [name] MRN: [id number] Date of Birth: 08-01-1961 Referring Provider: Alvan Dame  Encounter Date: 05/22/2017      PT End of Session - 05/22/17 0849    Visit Number 10   Date for PT Re-Evaluation 07/01/17   PT Start Time 0758   PT Stop Time 0901   PT Time Calculation (min) 63 min   Activity Tolerance Patient tolerated treatment well   Behavior During Therapy [facility] for tasks assessed/performed      Past Medical History:  Diagnosis Date  . Arthritis   . BURSITIS, LEFT HIP 05/20/2010  . Cancer [facility]) 2010   pancreatic cancer  . Chronic kidney disease    stones; 13 stones, currently has 2 stones   . Claustrophobia    "take Lorazepam if I have to fly"  . DIAB W/O COMP TYPE II/UNS NOT STATED UNCNTRL 03/30/2009  . ELEVATED BLOOD PRESSURE 03/30/2009  . Fissure, anal    occ bleeds still  . Headache(784.0)    "only when I was working; had headaches and migraines"  . Hyperlipidemia 10/11/2010  . SHOULDER PAIN, LEFT 10/20/2008    Past Surgical History:  Procedure Laterality Date  . ANAL FISSURE REPAIR  07/28/2011   Procedure: ANAL FISSURE REPAIR;  Surgeon: Adin Hector, MD;  [facility];  Service: General;  Laterality: N/A;  Repair Anal Fissure/sphinterotomy and excision rectal polypl  . BACK SURGERY  2000   "had 2 cages put in"  . BIOPSY PANCREAS  2010   benign  . HERNIA REPAIR  2774   umbilical  . KIDNEY STONES  2010 & 2008 & 2005  . LAPAROSCOPIC INCISIONAL / UMBILICAL / VENTRAL HERNIA REPAIR  07/08/11   VHR  . left rotator cuff  02/26/2011  . left shoulder bone spurs  2011  . LIPOMA EXCISION     "several; off back, arms"  . pancreatic tumor  2010  . right rotator cuff     2011  . TONSILLECTOMY  1967  . TOTAL KNEE ARTHROPLASTY Left 04/28/2017   Procedure: LEFT  TOTAL KNEE ARTHROPLASTY;  Surgeon: Paralee Cancel, MD;  [facility];  Service: Orthopedics;  Laterality: Left;  Marland Kitchen VASECTOMY  1994  . VENTRAL HERNIA REPAIR  07/08/2011   Procedure: LAPAROSCOPIC VENTRAL HERNIA;  Surgeon: Adin Hector, MD;  Location: [place];  Service: General;  Laterality: N/A;  Laparoscopic repair ventral hernia with mesh, Lysis of adhesions.    There were no vitals filed for this visit.      Subjective Assessment - 05/22/17 0801    Subjective "Im tired, been up since 2, my leg woke me up, sore"   Currently in Pain? Yes   Pain Score 4    Pain Location Knee   Pain Orientation Left                         [facility] Adult PT Treatment/Exercise - 05/22/17 0001      Knee/Hip Exercises: Aerobic   Nustep x6 min L4 LE only      Knee/Hip Exercises: Machines for Strengthening   Cybex Leg Press 30lb 2x10, LLE only 20lb TKE 3x5      Knee/Hip Exercises: Standing   Other Standing Knee Exercises Cable pressdowns 40lb 2x15   Other Standing Knee Exercises TKE  ball on wall 2x10 3'' hold     Knee/Hip Exercises: Seated   Long Arc Quad Left;10 reps;2 sets  3'' hold    Long Arc Quad Weight 2 lbs.   Other Seated Knee/Hip Exercises LLE x20   Marching Weights 2 lbs.   Hamstring Curl Left;2 sets;15 reps   Hamstring Limitations green tband      Vasopneumatic   Number Minutes Vasopneumatic  15 minutes   Vasopnuematic Location  Knee   Vasopneumatic Pressure Low   Vasopneumatic Temperature  33                  PT Short Term Goals - 05/13/17 0842      PT SHORT TERM GOAL #1   Title independent with initial HEP   Status Achieved           PT Long Term Goals - 05/18/17 3557      PT LONG TERM GOAL #1   Title decrease pain 50%   Status Partially Met     PT LONG TERM GOAL #2   Title walk without assistive device   Status On-going     PT LONG TERM GOAL #3   Title increase AROM of the left knee to 5-115 degrees flexion   Status Partially Met      PT LONG TERM GOAL #4   Title go up and down stairs step over step   Status On-going               Plan - 05/22/17 0850    Clinical Impression Statement Pt with good ROM and strength with L knee flexion. L quad is very weak, lacks full TKE. No reports of increase pain with today's exercises.   Rehab Potential Good   PT Frequency 3x / week   PT Duration 8 weeks   PT Treatment/Interventions ADLs/Self Care Home Management;Cryotherapy;Electrical Stimulation;Gait training;Stair training;Functional mobility training;Patient/family education;Balance training;Therapeutic exercise;Therapeutic activities;Neuromuscular re-education;Manual techniques;Vasopneumatic Device   PT Next Visit Plan progress as tolerated      Patient will benefit from skilled therapeutic intervention in order to improve the following deficits and impairments:  Abnormal gait, Cardiopulmonary status limiting activity, Decreased activity tolerance, Decreased balance, Decreased mobility, Decreased strength, Increased edema, Decreased scar mobility, Pain, Decreased range of motion, Difficulty walking  Visit Diagnosis: Stiffness of left knee, not elsewhere classified  Acute pain of left knee  Localized swelling, mass and lump, left lower limb  Difficulty in walking, not elsewhere classified     Problem List Patient Active Problem List   Diagnosis Date Noted  . S/P left TKA 04/28/2017  . S/P total knee replacement 04/28/2017  . Situational anxiety 03/17/2016  . Low testosterone 03/13/2015  . Osteoarthritis, multiple sites 08/22/2013  . Obesity (BMI 30-39.9) 05/08/2013  . 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  . Liver nodule 10/07/2012  . History of kidney stones 08/27/2012  . 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 Rotator cuff tear, left 01/23/2011  . Hyperlipidemia 10/11/2010  . BURSITIS, LEFT HIP 05/20/2010  . Controlled type 2 diabetes mellitus without complication ([place]) 32/20/2542  .  CHRONIC TENSION TYPE HEADACHE 03/30/2009  . ELEVATED BLOOD PRESSURE 03/30/2009  . SHOULDER PAIN, LEFT 10/20/2008  . CONTUSION OF KNEE 10/20/2008  . ABNORMAL FINDINGS GI TRACT 06/23/2008    Scot Jun, PTA 05/22/2017, 8:52 AM  [address] [place], [place], [address] Phone: [phone]   Fax:  [phone]  Name: [name]  MRN: [id number] Date of Birth: September 20, 1960

## 2017-05-25 ENCOUNTER — Encounter: Payer: Self-pay | Admitting: Physical Therapy

## 2017-05-25 ENCOUNTER — Ambulatory Visit: Payer: BLUE CROSS/BLUE SHIELD | Admitting: Physical Therapy

## 2017-05-25 DIAGNOSIS — R262 Difficulty in walking, not elsewhere classified: Secondary | ICD-10-CM

## 2017-05-25 DIAGNOSIS — R2242 Localized swelling, mass and lump, left lower limb: Secondary | ICD-10-CM

## 2017-05-25 DIAGNOSIS — M25562 Pain in left knee: Secondary | ICD-10-CM | POA: Diagnosis not present

## 2017-05-25 DIAGNOSIS — M25662 Stiffness of left knee, not elsewhere classified: Secondary | ICD-10-CM

## 2017-05-25 NOTE — Therapy (Signed)
[address] [place], [place], [address] Phone: [phone]   Fax:  [phone]  Physical Therapy Treatment  Patient Details  Name: [name] MRN: [id number] Date of Birth: Jun 11, 1961 Referring Provider: Alvan Dame  Encounter Date: 05/25/2017      PT End of Session - 05/25/17 0840    Visit Number 11   Date for PT Re-Evaluation 07/01/17   PT Start Time 0758   PT Stop Time 0855   PT Time Calculation (min) 57 min   Activity Tolerance Patient tolerated treatment well   Behavior During Therapy [facility] for tasks assessed/performed      Past Medical History:  Diagnosis Date  . Arthritis   . BURSITIS, LEFT HIP 05/20/2010  . Cancer [facility]) 2010   pancreatic cancer  . Chronic kidney disease    stones; 13 stones, currently has 2 stones   . Claustrophobia    "take Lorazepam if I have to fly"  . DIAB W/O COMP TYPE II/UNS NOT STATED UNCNTRL 03/30/2009  . ELEVATED BLOOD PRESSURE 03/30/2009  . Fissure, anal    occ bleeds still  . Headache(784.0)    "only when I was working; had headaches and migraines"  . Hyperlipidemia 10/11/2010  . SHOULDER PAIN, LEFT 10/20/2008    Past Surgical History:  Procedure Laterality Date  . ANAL FISSURE REPAIR  07/28/2011   Procedure: ANAL FISSURE REPAIR;  Surgeon: Adin Hector, MD;  [facility];  Service: General;  Laterality: N/A;  Repair Anal Fissure/sphinterotomy and excision rectal polypl  . BACK SURGERY  2000   "had 2 cages put in"  . BIOPSY PANCREAS  2010   benign  . HERNIA REPAIR  2542   umbilical  . KIDNEY STONES  2010 & 2008 & 2005  . LAPAROSCOPIC INCISIONAL / UMBILICAL / VENTRAL HERNIA REPAIR  07/08/11   VHR  . left rotator cuff  02/26/2011  . left shoulder bone spurs  2011  . LIPOMA EXCISION     "several; off back, arms"  . pancreatic tumor  2010  . right rotator cuff     2011  . TONSILLECTOMY  1967  . TOTAL KNEE ARTHROPLASTY Left 04/28/2017   Procedure: LEFT  TOTAL KNEE ARTHROPLASTY;  Surgeon: Paralee Cancel, MD;  [facility];  Service: Orthopedics;  Laterality: Left;  Marland Kitchen VASECTOMY  1994  . VENTRAL HERNIA REPAIR  07/08/2011   Procedure: LAPAROSCOPIC VENTRAL HERNIA;  Surgeon: Adin Hector, MD;  Location: [place];  Service: General;  Laterality: N/A;  Laparoscopic repair ventral hernia with mesh, Lysis of adhesions.    There were no vitals filed for this visit.      Subjective Assessment - 05/25/17 0801    Subjective "My knee hurts, but I have been doing your extensions"   Currently in Pain? Yes   Pain Score 5    Pain Location Knee   Pain Orientation Left                         [facility] Adult PT Treatment/Exercise - 05/25/17 0001      Knee/Hip Exercises: Aerobic   Elliptical I 10 R3 x3 min   Recumbent Bike Seat 11 L1 x 53mn    Nustep x4 min L5 LE only      Knee/Hip Exercises: Machines for Strengthening   Cybex Knee Flexion 25lb 2x10 LLE    Cybex Leg Press 40lb 2x10, LLE only 20lb TKE 3x5  Knee/Hip Exercises: Standing   Heel Raises Both;2 sets;15 reps;2 seconds     Knee/Hip Exercises: Seated   Long Arc Quad Left;10 reps;2 sets  2 sec hold    Long Arc Quad Weight 2 lbs.   Sit to Sand 2 sets;10 reps;without UE support  from blue chair     Vasopneumatic   Number Minutes Vasopneumatic  15 minutes   Vasopnuematic Location  Knee   Vasopneumatic Pressure Low   Vasopneumatic Temperature  33                  PT Short Term Goals - 05/13/17 0842      PT SHORT TERM GOAL #1   Title independent with initial HEP   Status Achieved           PT Long Term Goals - 05/18/17 4917      PT LONG TERM GOAL #1   Title decrease pain 50%   Status Partially Met     PT LONG TERM GOAL #2   Title walk without assistive device   Status On-going     PT LONG TERM GOAL #3   Title increase AROM of the left knee to 5-115 degrees flexion   Status Partially Met     PT LONG TERM GOAL #4   Title go up and down  stairs step over step   Status On-going               Plan - 05/25/17 0840    Clinical Impression Statement Seated LAQ has really improved, pt repots working on it a lot over the weekend. Completed all of today's exercises well with improved strength. Upon exiting the machine reports a sharp pain when standing.   Rehab Potential Good   PT Frequency 3x / week   PT Duration 8 weeks   PT Treatment/Interventions ADLs/Self Care Home Management;Cryotherapy;Electrical Stimulation;Gait training;Stair training;Functional mobility training;Patient/family education;Balance training;Therapeutic exercise;Therapeutic activities;Neuromuscular re-education;Manual techniques;Vasopneumatic Device   PT Next Visit Plan progress as tolerated      Patient will benefit from skilled therapeutic intervention in order to improve the following deficits and impairments:  Abnormal gait, Cardiopulmonary status limiting activity, Decreased activity tolerance, Decreased balance, Decreased mobility, Decreased strength, Increased edema, Decreased scar mobility, Pain, Decreased range of motion, Difficulty walking  Visit Diagnosis: Acute pain of left knee  Stiffness of left knee, not elsewhere classified  Localized swelling, mass and lump, left lower limb  Difficulty in walking, not elsewhere classified     Problem List Patient Active Problem List   Diagnosis Date Noted  . S/P left TKA 04/28/2017  . S/P total knee replacement 04/28/2017  . Situational anxiety 03/17/2016  . Low testosterone 03/13/2015  . Osteoarthritis, multiple sites 08/22/2013  . Obesity (BMI 30-39.9) 05/08/2013  . 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  . Liver nodule 10/07/2012  . History of kidney stones 08/27/2012  . 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 Rotator cuff tear, left 01/23/2011  . Hyperlipidemia 10/11/2010  . BURSITIS, LEFT HIP 05/20/2010  . Controlled type 2 diabetes mellitus without complication ([place])  91/50/5697  . CHRONIC TENSION TYPE HEADACHE 03/30/2009  . ELEVATED BLOOD PRESSURE 03/30/2009  . SHOULDER PAIN, LEFT 10/20/2008  . CONTUSION OF KNEE 10/20/2008  . ABNORMAL FINDINGS GI TRACT 06/23/2008    Scot Jun, PTA 05/25/2017, 8:42 AM  [address] [place], [place], [address] Phone: [phone]   Fax:  [phone]  Name: [name]  MRN: [id number] Date of Birth: September 20, 1960

## 2017-05-27 ENCOUNTER — Ambulatory Visit: Payer: BLUE CROSS/BLUE SHIELD | Admitting: Physical Therapy

## 2017-05-27 ENCOUNTER — Encounter: Payer: Self-pay | Admitting: Physical Therapy

## 2017-05-27 DIAGNOSIS — M25562 Pain in left knee: Secondary | ICD-10-CM

## 2017-05-27 DIAGNOSIS — R2242 Localized swelling, mass and lump, left lower limb: Secondary | ICD-10-CM | POA: Diagnosis not present

## 2017-05-27 DIAGNOSIS — M25662 Stiffness of left knee, not elsewhere classified: Secondary | ICD-10-CM

## 2017-05-27 DIAGNOSIS — R262 Difficulty in walking, not elsewhere classified: Secondary | ICD-10-CM | POA: Diagnosis not present

## 2017-05-27 NOTE — Therapy (Signed)
[address] [place], [place], [address] Phone: [phone]   Fax:  [phone]  Physical Therapy Treatment  Patient Details  Name: [name] MRN: [id number] Date of Birth: 28-Nov-1960 Referring Provider: Alvan Dame  Encounter Date: 05/27/2017      PT End of Session - 05/27/17 0846    Visit Number 12   Date for PT Re-Evaluation 07/01/17   PT Start Time 0758   PT Stop Time 0859   PT Time Calculation (min) 61 min   Activity Tolerance Patient tolerated treatment well   Behavior During Therapy [facility] for tasks assessed/performed      Past Medical History:  Diagnosis Date  . Arthritis   . BURSITIS, LEFT HIP 05/20/2010  . Cancer [facility]) 2010   pancreatic cancer  . Chronic kidney disease    stones; 13 stones, currently has 2 stones   . Claustrophobia    "take Lorazepam if I have to fly"  . DIAB W/O COMP TYPE II/UNS NOT STATED UNCNTRL 03/30/2009  . ELEVATED BLOOD PRESSURE 03/30/2009  . Fissure, anal    occ bleeds still  . Headache(784.0)    "only when I was working; had headaches and migraines"  . Hyperlipidemia 10/11/2010  . SHOULDER PAIN, LEFT 10/20/2008    Past Surgical History:  Procedure Laterality Date  . ANAL FISSURE REPAIR  07/28/2011   Procedure: ANAL FISSURE REPAIR;  Surgeon: Adin Hector, MD;  [facility];  Service: General;  Laterality: N/A;  Repair Anal Fissure/sphinterotomy and excision rectal polypl  . BACK SURGERY  2000   "had 2 cages put in"  . BIOPSY PANCREAS  2010   benign  . HERNIA REPAIR  6962   umbilical  . KIDNEY STONES  2010 & 2008 & 2005  . LAPAROSCOPIC INCISIONAL / UMBILICAL / VENTRAL HERNIA REPAIR  07/08/11   VHR  . left rotator cuff  02/26/2011  . left shoulder bone spurs  2011  . LIPOMA EXCISION     "several; off back, arms"  . pancreatic tumor  2010  . right rotator cuff     2011  . TONSILLECTOMY  1967  . TOTAL KNEE ARTHROPLASTY Left 04/28/2017   Procedure: LEFT  TOTAL KNEE ARTHROPLASTY;  Surgeon: Paralee Cancel, MD;  [facility];  Service: Orthopedics;  Laterality: Left;  Marland Kitchen VASECTOMY  1994  . VENTRAL HERNIA REPAIR  07/08/2011   Procedure: LAPAROSCOPIC VENTRAL HERNIA;  Surgeon: Adin Hector, MD;  Location: [place];  Service: General;  Laterality: N/A;  Laparoscopic repair ventral hernia with mesh, Lysis of adhesions.    There were no vitals filed for this visit.      Subjective Assessment - 05/27/17 0801    Subjective "If I don't get that sharp pain the knee is not bad"   Currently in Pain? Yes   Pain Score 3    Pain Location Knee   Pain Orientation Left                         [facility] Adult PT Treatment/Exercise - 05/27/17 0001      Knee/Hip Exercises: Aerobic   Elliptical I 10 R3 x3 min   Recumbent Bike Seat 11 L1 x 33mn    Nustep x4 min L5 LE only      Knee/Hip Exercises: Machines for Strengthening   Cybex Leg Press 40lb 2x15, LLE only 30lb 2x10      Knee/Hip Exercises: Standing  Forward Step Up Left;2 sets;10 reps;Step Height: 6";Step Height: 4";Step Height: 8";Hand Hold: 1   Other Standing Knee Exercises Cable pressdowns 70lb 2x15   Other Standing Knee Exercises TKE ball on wall 2x10 3'' hold     Knee/Hip Exercises: Seated   Long Arc Quad Left;10 reps;2 sets   Long Arc Quad Weight 3 lbs.     Vasopneumatic   Number Minutes Vasopneumatic  15 minutes   Vasopnuematic Location  Knee   Vasopneumatic Pressure Medium   Vasopneumatic Temperature  33                  PT Short Term Goals - 05/13/17 0842      PT SHORT TERM GOAL #1   Title independent with initial HEP   Status Achieved           PT Long Term Goals - 05/18/17 7262      PT LONG TERM GOAL #1   Title decrease pain 50%   Status Partially Met     PT LONG TERM GOAL #2   Title walk without assistive device   Status On-going     PT LONG TERM GOAL #3   Title increase AROM of the left knee to 5-115 degrees flexion   Status  Partially Met     PT LONG TERM GOAL #4   Title go up and down stairs step over step   Status On-going               Plan - 05/27/17 0846    Clinical Impression Statement Pt has good ROM, weakness in L quad. Good effort with all interventions. Some compensation with step ups. Amb without AD.   Rehab Potential Good   PT Frequency 3x / week   PT Duration 8 weeks   PT Next Visit Plan progress as tolerated      Patient will benefit from skilled therapeutic intervention in order to improve the following deficits and impairments:  Abnormal gait, Cardiopulmonary status limiting activity, Decreased activity tolerance, Decreased balance, Decreased mobility, Decreased strength, Increased edema, Decreased scar mobility, Pain, Decreased range of motion, Difficulty walking  Visit Diagnosis: Localized swelling, mass and lump, left lower limb  Stiffness of left knee, not elsewhere classified  Acute pain of left knee  Difficulty in walking, not elsewhere classified     Problem List Patient Active Problem List   Diagnosis Date Noted  . S/P left TKA 04/28/2017  . S/P total knee replacement 04/28/2017  . Situational anxiety 03/17/2016  . Low testosterone 03/13/2015  . Osteoarthritis, multiple sites 08/22/2013  . Obesity (BMI 30-39.9) 05/08/2013  . 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  . Liver nodule 10/07/2012  . History of kidney stones 08/27/2012  . 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 Rotator cuff tear, left 01/23/2011  . Hyperlipidemia 10/11/2010  . BURSITIS, LEFT HIP 05/20/2010  . Controlled type 2 diabetes mellitus without complication ([place]) 03/55/9741  . CHRONIC TENSION TYPE HEADACHE 03/30/2009  . ELEVATED BLOOD PRESSURE 03/30/2009  . SHOULDER PAIN, LEFT 10/20/2008  . CONTUSION OF KNEE 10/20/2008  . ABNORMAL FINDINGS GI TRACT 06/23/2008    Scot Jun, PTA 05/27/2017, 8:48 AM  [place] [address] [place], [place], [address] Phone: [phone]   Fax:  [phone]  Name: [name] MRN: [id number] Date of Birth: [birth year]

## 2017-05-29 ENCOUNTER — Encounter: Payer: Self-pay | Admitting: Physical Therapy

## 2017-05-29 ENCOUNTER — Ambulatory Visit: Payer: BLUE CROSS/BLUE SHIELD | Admitting: Physical Therapy

## 2017-05-29 DIAGNOSIS — M25562 Pain in left knee: Secondary | ICD-10-CM | POA: Diagnosis not present

## 2017-05-29 DIAGNOSIS — M25662 Stiffness of left knee, not elsewhere classified: Secondary | ICD-10-CM | POA: Diagnosis not present

## 2017-05-29 DIAGNOSIS — R262 Difficulty in walking, not elsewhere classified: Secondary | ICD-10-CM | POA: Diagnosis not present

## 2017-05-29 DIAGNOSIS — R2242 Localized swelling, mass and lump, left lower limb: Secondary | ICD-10-CM | POA: Diagnosis not present

## 2017-05-29 NOTE — Therapy (Signed)
[address] [place], [place], [address] Phone: [phone]   Fax:  [phone]  Physical Therapy Treatment  Patient Details  Name: [name] MRN: [id number] Date of Birth: [birth year] Referring Provider: Alvan Dame  Encounter Date: 05/29/2017      PT End of Session - 05/29/17 0842    Visit Number 13   Date for PT Re-Evaluation 07/01/17   PT Start Time 0758   PT Stop Time 0854   PT Time Calculation (min) 56 min   Activity Tolerance Patient tolerated treatment well   Behavior During Therapy [facility] for tasks assessed/performed      Past Medical History:  Diagnosis Date  . Arthritis   . BURSITIS, LEFT HIP 05/20/2010  . Cancer [facility]) 2010   pancreatic cancer  . Chronic kidney disease    stones; 13 stones, currently has 2 stones   . Claustrophobia    "take Lorazepam if I have to fly"  . DIAB W/O COMP TYPE II/UNS NOT STATED UNCNTRL 03/30/2009  . ELEVATED BLOOD PRESSURE 03/30/2009  . Fissure, anal    occ bleeds still  . Headache(784.0)    "only when I was working; had headaches and migraines"  . Hyperlipidemia 10/11/2010  . SHOULDER PAIN, LEFT 10/20/2008    Past Surgical History:  Procedure Laterality Date  . ANAL FISSURE REPAIR  07/28/2011   Procedure: ANAL FISSURE REPAIR;  Surgeon: Adin Hector, MD;  [facility];  Service: General;  Laterality: N/A;  Repair Anal Fissure/sphinterotomy and excision rectal polypl  . BACK SURGERY  2000   "had 2 cages put in"  . BIOPSY PANCREAS  2010   benign  . HERNIA REPAIR  8887   umbilical  . KIDNEY STONES  2010 & 2008 & 2005  . LAPAROSCOPIC INCISIONAL / UMBILICAL / VENTRAL HERNIA REPAIR  07/08/11   VHR  . left rotator cuff  02/26/2011  . left shoulder bone spurs  2011  . LIPOMA EXCISION     "several; off back, arms"  . pancreatic tumor  2010  . right rotator cuff     2011  . TONSILLECTOMY  1967  . TOTAL KNEE ARTHROPLASTY Left 04/28/2017   Procedure: LEFT  TOTAL KNEE ARTHROPLASTY;  Surgeon: Paralee Cancel, MD;  [facility];  Service: Orthopedics;  Laterality: Left;  Marland Kitchen VASECTOMY  1994  . VENTRAL HERNIA REPAIR  07/08/2011   Procedure: LAPAROSCOPIC VENTRAL HERNIA;  Surgeon: Adin Hector, MD;  Location: [place];  Service: General;  Laterality: N/A;  Laparoscopic repair ventral hernia with mesh, Lysis of adhesions.    There were no vitals filed for this visit.      Subjective Assessment - 05/29/17 0757    Subjective "You know the morning are starting to get better, it is not as swollen, but it does not take long for it to swell."   Currently in Pain? Yes   Pain Score 3    Pain Location Knee   Pain Orientation Left            [facility] PT Assessment - 05/29/17 0001      AROM   Left Knee Extension 4   Left Knee Flexion 117                     [facility] Adult PT Treatment/Exercise - 05/29/17 0001      Knee/Hip Exercises: Aerobic   Elliptical I 10 R3 x3 min   Recumbent Bike  Seat 11 L1 x 60mn    Nustep x4 min L5 LE only      Knee/Hip Exercises: Machines for Strengthening   Cybex Knee Flexion 45lb 2x10; LLE 25lb 2x10    Cybex Leg Press 20lb 2x10     Knee/Hip Exercises: Standing   Heel Raises Both;2 sets;15 reps;2 seconds   Forward Step Up Left;2 sets;10 reps;Step Height: 6";Step Height: 4";Step Height: 8";Hand Hold: 1   Other Standing Knee Exercises TKE ball on wall 2x10 3'' hold     Knee/Hip Exercises: Seated   Long Arc Quad Left;10 reps;2 sets   Long Arc Quad Weight 3 lbs.     Vasopneumatic   Number Minutes Vasopneumatic  15 minutes   Vasopnuematic Location  Knee   Vasopneumatic Pressure Medium   Vasopneumatic Temperature  33                  PT Short Term Goals - 05/13/17 0842      PT SHORT TERM GOAL #1   Title independent with initial HEP   Status Achieved           PT Long Term Goals - 05/18/17 04166     PT LONG TERM GOAL #1   Title decrease pain 50%   Status Partially Met     PT  LONG TERM GOAL #2   Title walk without assistive device   Status On-going     PT LONG TERM GOAL #3   Title increase AROM of the left knee to 5-115 degrees flexion   Status Partially Met     PT LONG TERM GOAL #4   Title go up and down stairs step over step   Status On-going               Plan - 05/29/17 0843    Clinical Impression Statement Attempted SL ext on machine but unable to perform due to pain and weakness. Completed LAQ with ankle weight well. Assist needed with SL on leg press but pt able to gain contraction to perform without assist. Good strength with seated HS curls.    Rehab Potential Good   PT Frequency 3x / week   PT Duration 8 weeks   PT Next Visit Plan progress as tolerated      Patient will benefit from skilled therapeutic intervention in order to improve the following deficits and impairments:  Abnormal gait, Cardiopulmonary status limiting activity, Decreased activity tolerance, Decreased balance, Decreased mobility, Decreased strength, Increased edema, Decreased scar mobility, Pain, Decreased range of motion, Difficulty walking  Visit Diagnosis: Stiffness of left knee, not elsewhere classified  Localized swelling, mass and lump, left lower limb  Acute pain of left knee  Difficulty in walking, not elsewhere classified     Problem List Patient Active Problem List   Diagnosis Date Noted  . S/P left TKA 04/28/2017  . S/P total knee replacement 04/28/2017  . Situational anxiety 03/17/2016  . Low testosterone 03/13/2015  . Osteoarthritis, multiple sites 08/22/2013  . Obesity (BMI 30-39.9) 05/08/2013  . 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  . Liver nodule 10/07/2012  . History of kidney stones 08/27/2012  . 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 Rotator cuff tear, left 01/23/2011  . Hyperlipidemia 10/11/2010  . BURSITIS, LEFT HIP 05/20/2010  . Controlled type 2 diabetes mellitus without complication ([place] 006/30/1601 . CHRONIC  TENSION TYPE HEADACHE 03/30/2009  . ELEVATED BLOOD PRESSURE 03/30/2009  . SHOULDER PAIN, LEFT 10/20/2008  . CONTUSION OF KNEE 10/20/2008  .  ABNORMAL FINDINGS GI TRACT 06/23/2008    Scot Jun, PTA 05/29/2017, 8:45 AM  [place], [place], [address] Phone: [phone]   Fax:  [phone]  Name: KAYHAN BOARDLEY MRN: [id number] Date of Birth: May 03, 1961

## 2017-06-01 ENCOUNTER — Ambulatory Visit: Payer: BLUE CROSS/BLUE SHIELD | Admitting: Physical Therapy

## 2017-06-01 ENCOUNTER — Encounter: Payer: Self-pay | Admitting: Physical Therapy

## 2017-06-01 DIAGNOSIS — M25562 Pain in left knee: Secondary | ICD-10-CM | POA: Diagnosis not present

## 2017-06-01 DIAGNOSIS — R2242 Localized swelling, mass and lump, left lower limb: Secondary | ICD-10-CM | POA: Diagnosis not present

## 2017-06-01 DIAGNOSIS — R262 Difficulty in walking, not elsewhere classified: Secondary | ICD-10-CM | POA: Diagnosis not present

## 2017-06-01 DIAGNOSIS — M25662 Stiffness of left knee, not elsewhere classified: Secondary | ICD-10-CM | POA: Diagnosis not present

## 2017-06-01 NOTE — Therapy (Signed)
[address] [place], [place], [address] Phone: [phone]   Fax:  [phone]  Physical Therapy Treatment  Patient Details  Name: [name] MRN: [id number] Date of Birth: 11/02/1960 Referring Provider: Alvan Dame  Encounter Date: 06/01/2017      PT End of Session - 06/01/17 0840    Visit Number 14   Date for PT Re-Evaluation 07/01/17   PT Start Time 4270   PT Stop Time 0855   PT Time Calculation (min) 60 min   Activity Tolerance Patient tolerated treatment well   Behavior During Therapy [facility] for tasks assessed/performed      Past Medical History:  Diagnosis Date  . Arthritis   . BURSITIS, LEFT HIP 05/20/2010  . Cancer [facility]) 2010   pancreatic cancer  . Chronic kidney disease    stones; 13 stones, currently has 2 stones   . Claustrophobia    "take Lorazepam if I have to fly"  . DIAB W/O COMP TYPE II/UNS NOT STATED UNCNTRL 03/30/2009  . ELEVATED BLOOD PRESSURE 03/30/2009  . Fissure, anal    occ bleeds still  . Headache(784.0)    "only when I was working; had headaches and migraines"  . Hyperlipidemia 10/11/2010  . SHOULDER PAIN, LEFT 10/20/2008    Past Surgical History:  Procedure Laterality Date  . ANAL FISSURE REPAIR  07/28/2011   Procedure: ANAL FISSURE REPAIR;  Surgeon: Adin Hector, MD;  Location: [facility];  Service: General;  Laterality: N/A;  Repair Anal Fissure/sphinterotomy and excision rectal polypl  . BACK SURGERY  2000   "had 2 cages put in"  . BIOPSY PANCREAS  2010   benign  . HERNIA REPAIR  [id number]   umbilical  . KIDNEY STONES  2010 & 2008 & 2005  . LAPAROSCOPIC INCISIONAL / UMBILICAL / VENTRAL HERNIA REPAIR  07/08/11   VHR  . left rotator cuff  02/26/2011  . left shoulder bone spurs  2011  . LIPOMA EXCISION     "several; off back, arms"  . pancreatic tumor  2010  . right rotator cuff     2011  . TONSILLECTOMY  1967  . TOTAL KNEE ARTHROPLASTY Left 04/28/2017   Procedure: LEFT  TOTAL KNEE ARTHROPLASTY;  Surgeon: Paralee Cancel, MD;  [facility]: [facility];  Service: Orthopedics;  Laterality: Left;  Marland Kitchen VASECTOMY  1994  . VENTRAL HERNIA REPAIR  07/08/2011   Procedure: LAPAROSCOPIC VENTRAL HERNIA;  Surgeon: Adin Hector, MD;  Location: [facility];  Service: General;  Laterality: N/A;  Laparoscopic repair ventral hernia with mesh, Lysis of adhesions.    There were no vitals filed for this visit.      Subjective Assessment - 06/01/17 0755    Subjective Pt reports that he was very stiff this weekend. He could not make full revolutions on his bike    Currently in Pain? Yes   Pain Score 3    Pain Location Knee   Pain Orientation Left                         [facility] Adult PT Treatment/Exercise - 06/01/17 0001      Knee/Hip Exercises: Aerobic   Elliptical I 10 R5 81fd/2rev   Recumbent Bike Seat 11 L2 x 441m    Nustep x4 min L5 LE only      Knee/Hip Exercises: Machines for Strengthening   Cybex Knee Extension Eccentrics LLE 2x5    Cybex Knee  Flexion 45lb 2x10; LLE 25lb 2x10    Cybex Leg Press 50lb 3x10; LLE 20lb 2x10     Knee/Hip Exercises: Standing   Heel Raises Both;2 sets;15 reps;2 seconds   Hip Flexion Left;2 sets;15 reps;Knee bent   Hip Flexion Limitations 2     Knee/Hip Exercises: Seated   Long Arc Quad Left;10 reps;2 sets   Long Arc Quad Weight 3 lbs.     Vasopneumatic   Number Minutes Vasopneumatic  15 minutes   Vasopnuematic Location  Knee   Vasopneumatic Pressure Medium   Vasopneumatic Temperature  33                  PT Short Term Goals - 05/13/17 0842      PT SHORT TERM GOAL #1   Title independent with initial HEP   Status Achieved           PT Long Term Goals - 05/18/17 3614      PT LONG TERM GOAL #1   Title decrease pain 50%   Status Partially Met     PT LONG TERM GOAL #2   Title walk without assistive device   Status On-going     PT LONG TERM GOAL #3   Title increase AROM of the left knee to 5-115  degrees flexion   Status Partially Met     PT LONG TERM GOAL #4   Title go up and down stairs step over step   Status On-going               Plan - 06/01/17 0841    Clinical Impression Statement Pt reports a tolerable 5-6/10 pain ratting with eccentrics on leg extensions. Completed all other exercises well. Pt does ambulate with rigid LLE.   Rehab Potential Good   PT Frequency 3x / week   PT Duration 8 weeks   PT Treatment/Interventions ADLs/Self Care Home Management;Cryotherapy;Electrical Stimulation;Gait training;Stair training;Functional mobility training;Patient/family education;Balance training;Therapeutic exercise;Therapeutic activities;Neuromuscular re-education;Manual techniques;Vasopneumatic Device   PT Next Visit Plan progress as tolerated      Patient will benefit from skilled therapeutic intervention in order to improve the following deficits and impairments:  Abnormal gait, Cardiopulmonary status limiting activity, Decreased activity tolerance, Decreased balance, Decreased mobility, Decreased strength, Increased edema, Decreased scar mobility, Pain, Decreased range of motion, Difficulty walking  Visit Diagnosis: Localized swelling, mass and lump, left lower limb  Stiffness of left knee, not elsewhere classified  Acute pain of left knee     Problem List Patient Active Problem List   Diagnosis Date Noted  . S/P left TKA 04/28/2017  . S/P total knee replacement 04/28/2017  . Situational anxiety 03/17/2016  . Low testosterone 03/13/2015  . Osteoarthritis, multiple sites 08/22/2013  . Obesity (BMI 30-39.9) 05/08/2013  . 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  . Liver nodule 10/07/2012  . History of kidney stones 08/27/2012  . 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 Rotator cuff tear, left 01/23/2011  . Hyperlipidemia 10/11/2010  . BURSITIS, LEFT HIP 05/20/2010  . Controlled type 2 diabetes mellitus without complication ([place]) 43/15/4008  . CHRONIC  TENSION TYPE HEADACHE 03/30/2009  . ELEVATED BLOOD PRESSURE 03/30/2009  . SHOULDER PAIN, LEFT 10/20/2008  . CONTUSION OF KNEE 10/20/2008  . ABNORMAL FINDINGS GI TRACT 06/23/2008    Scot Jun, PTA 06/01/2017, 8:43 AM  [address] [place], [place], [address] Phone: [phone]   Fax:  [phone]  Name: [name] MRN: [id number]  Date of Birth: 02/13/1961

## 2017-06-03 ENCOUNTER — Encounter: Payer: Self-pay | Admitting: Physical Therapy

## 2017-06-03 ENCOUNTER — Ambulatory Visit: Payer: BLUE CROSS/BLUE SHIELD | Admitting: Physical Therapy

## 2017-06-03 DIAGNOSIS — M25562 Pain in left knee: Secondary | ICD-10-CM

## 2017-06-03 DIAGNOSIS — M25662 Stiffness of left knee, not elsewhere classified: Secondary | ICD-10-CM | POA: Diagnosis not present

## 2017-06-03 DIAGNOSIS — R262 Difficulty in walking, not elsewhere classified: Secondary | ICD-10-CM

## 2017-06-03 DIAGNOSIS — R2242 Localized swelling, mass and lump, left lower limb: Secondary | ICD-10-CM

## 2017-06-03 NOTE — Therapy (Signed)
[address] [place], [place], [address] Phone: [phone]   Fax:  [phone]  Physical Therapy Treatment  Patient Details  Name: [name] MRN: [id number] Date of Birth: 14-Jul-1961 Referring Provider: Alvan Dame  Encounter Date: 06/03/2017      PT End of Session - 06/03/17 0841    Visit Number 15   Date for PT Re-Evaluation 07/01/17   PT Start Time 0754   PT Stop Time 0854   PT Time Calculation (min) 60 min   Activity Tolerance Patient tolerated treatment well   Behavior During Therapy [facility] for tasks assessed/performed      Past Medical History:  Diagnosis Date  . Arthritis   . BURSITIS, LEFT HIP 05/20/2010  . Cancer [facility]) 2010   pancreatic cancer  . Chronic kidney disease    stones; 13 stones, currently has 2 stones   . Claustrophobia    "take Lorazepam if I have to fly"  . DIAB W/O COMP TYPE II/UNS NOT STATED UNCNTRL 03/30/2009  . ELEVATED BLOOD PRESSURE 03/30/2009  . Fissure, anal    occ bleeds still  . Headache(784.0)    "only when I was working; had headaches and migraines"  . Hyperlipidemia 10/11/2010  . SHOULDER PAIN, LEFT 10/20/2008    Past Surgical History:  Procedure Laterality Date  . ANAL FISSURE REPAIR  07/28/2011   Procedure: ANAL FISSURE REPAIR;  Surgeon: Adin Hector, MD;  [facility];  Service: General;  Laterality: N/A;  Repair Anal Fissure/sphinterotomy and excision rectal polypl  . BACK SURGERY  2000   "had 2 cages put in"  . BIOPSY PANCREAS  2010   benign  . HERNIA REPAIR  [id number]   umbilical  . KIDNEY STONES  2010 & 2008 & 2005  . LAPAROSCOPIC INCISIONAL / UMBILICAL / VENTRAL HERNIA REPAIR  07/08/11   VHR  . left rotator cuff  02/26/2011  . left shoulder bone spurs  2011  . LIPOMA EXCISION     "several; off back, arms"  . pancreatic tumor  2010  . right rotator cuff     2011  . TONSILLECTOMY  1967  . TOTAL KNEE ARTHROPLASTY Left 04/28/2017   Procedure: LEFT  TOTAL KNEE ARTHROPLASTY;  Surgeon: Paralee Cancel, MD;  [facility];  Service: Orthopedics;  Laterality: Left;  Marland Kitchen VASECTOMY  1994  . VENTRAL HERNIA REPAIR  07/08/2011   Procedure: LAPAROSCOPIC VENTRAL HERNIA;  Surgeon: Adin Hector, MD;  Location: [facility];  Service: General;  Laterality: N/A;  Laparoscopic repair ventral hernia with mesh, Lysis of adhesions.    There were no vitals filed for this visit.      Subjective Assessment - 06/03/17 0757    Subjective Pt reports that is does not seem to swell up as fast    Currently in Pain? Yes   Pain Score 2    Pain Location Knee   Pain Orientation Left                         [facility] Adult PT Treatment/Exercise - 06/03/17 0001      Knee/Hip Exercises: Aerobic   Elliptical I 10 R5 81fd/2rev   Recumbent Bike Seat 11 L2 x 469m    Nustep x4 min L5 LE only      Knee/Hip Exercises: Machines for Strengthening   Cybex Knee Flexion 45lb 2x15; LLE 25lb 2x10    Cybex Leg Press 50lb 2x15; LLE  20lb 2x5     Knee/Hip Exercises: Standing   Heel Raises Both;2 sets;15 reps;2 seconds   Hip Flexion Left;2 sets;15 reps;Knee bent   Hip Flexion Limitations 2   Forward Step Up Left;2 sets;10 reps;Hand Hold: 1;Step Height: 6";Hand Hold: 2     Knee/Hip Exercises: Seated   Long Arc Quad Left;5 reps;3 sets   [employer] Weight 3 lbs.     Vasopneumatic   Number Minutes Vasopneumatic  15 minutes   Vasopnuematic Location  Knee   Vasopneumatic Pressure Medium   Vasopneumatic Temperature  33                  PT Short Term Goals - 05/13/17 0842      PT SHORT TERM GOAL #1   Title independent with initial HEP   Status Achieved           PT Long Term Goals - 05/18/17 6384      PT LONG TERM GOAL #1   Title decrease pain 50%   Status Partially Met     PT LONG TERM GOAL #2   Title walk without assistive device   Status On-going     PT LONG TERM GOAL #3   Title increase AROM of the left knee to 5-115 degrees  flexion   Status Partially Met     PT LONG TERM GOAL #4   Title go up and down stairs step over step   Status On-going               Plan - 06/03/17 5364    Clinical Impression Statement Pt completed all of todays exercises, attempted Leg extensions with machine and had some anterior L knee pain. Rigid RLE with gait but able to correct some with cues.    Rehab Potential Good   PT Frequency 3x / week   PT Duration 8 weeks   PT Treatment/Interventions ADLs/Self Care Home Management;Cryotherapy;Electrical Stimulation;Gait training;Stair training;Functional mobility training;Patient/family education;Balance training;Therapeutic exercise;Therapeutic activities;Neuromuscular re-education;Manual techniques;Vasopneumatic Device   PT Next Visit Plan progress as tolerated      Patient will benefit from skilled therapeutic intervention in order to improve the following deficits and impairments:  Abnormal gait, Cardiopulmonary status limiting activity, Decreased activity tolerance, Decreased balance, Decreased mobility, Decreased strength, Increased edema, Decreased scar mobility, Pain, Decreased range of motion, Difficulty walking  Visit Diagnosis: Stiffness of left knee, not elsewhere classified  Localized swelling, mass and lump, left lower limb  Acute pain of left knee  Difficulty in walking, not elsewhere classified     Problem List Patient Active Problem List   Diagnosis Date Noted  . S/P left TKA 04/28/2017  . S/P total knee replacement 04/28/2017  . Situational anxiety 03/17/2016  . Low testosterone 03/13/2015  . Osteoarthritis, multiple sites 08/22/2013  . Obesity (BMI 30-39.9) 05/08/2013  . 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  . Liver nodule 10/07/2012  . History of kidney stones 08/27/2012  . 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 Rotator cuff tear, left 01/23/2011  . Hyperlipidemia 10/11/2010  . BURSITIS, LEFT HIP 05/20/2010  . Controlled type 2  diabetes mellitus without complication ([place]) 68/10/2120  . CHRONIC TENSION TYPE HEADACHE 03/30/2009  . ELEVATED BLOOD PRESSURE 03/30/2009  . SHOULDER PAIN, LEFT 10/20/2008  . CONTUSION OF KNEE 10/20/2008  . ABNORMAL FINDINGS GI TRACT 06/23/2008    Scot Jun, PTA 06/03/2017, 8:45 AM  [address] [place], [place], [address] Phone:  [phone]   Fax:  [phone]  Name: NYCHOLAS RAYNER MRN: [id number] Date of Birth: [birth year]

## 2017-06-05 ENCOUNTER — Encounter: Payer: Self-pay | Admitting: Physical Therapy

## 2017-06-05 ENCOUNTER — Ambulatory Visit: Payer: BLUE CROSS/BLUE SHIELD | Attending: Orthopedic Surgery | Admitting: Physical Therapy

## 2017-06-05 DIAGNOSIS — R2242 Localized swelling, mass and lump, left lower limb: Secondary | ICD-10-CM | POA: Insufficient documentation

## 2017-06-05 DIAGNOSIS — M25562 Pain in left knee: Secondary | ICD-10-CM | POA: Diagnosis not present

## 2017-06-05 DIAGNOSIS — M25662 Stiffness of left knee, not elsewhere classified: Secondary | ICD-10-CM | POA: Diagnosis not present

## 2017-06-05 DIAGNOSIS — R262 Difficulty in walking, not elsewhere classified: Secondary | ICD-10-CM | POA: Diagnosis not present

## 2017-06-05 NOTE — Therapy (Signed)
[address] [place], [place], [address] Phone: [phone]   Fax:  [phone]  Physical Therapy Treatment  Patient Details  Name: [name] MRN: [id number] Date of Birth: 1961-07-21 Referring Provider: Alvan Dame  Encounter Date: 06/05/2017      PT End of Session - 06/05/17 0842    Visit Number 16   Date for PT Re-Evaluation 07/01/17   PT Start Time 0754   PT Stop Time 0856   PT Time Calculation (min) 62 min   Activity Tolerance Patient tolerated treatment well   Behavior During Therapy [facility] for tasks assessed/performed      Past Medical History:  Diagnosis Date  . Arthritis   . BURSITIS, LEFT HIP 05/20/2010  . Cancer [facility]) 2010   pancreatic cancer  . Chronic kidney disease    stones; 13 stones, currently has 2 stones   . Claustrophobia    "take Lorazepam if I have to fly"  . DIAB W/O COMP TYPE II/UNS NOT STATED UNCNTRL 03/30/2009  . ELEVATED BLOOD PRESSURE 03/30/2009  . Fissure, anal    occ bleeds still  . Headache(784.0)    "only when I was working; had headaches and migraines"  . Hyperlipidemia 10/11/2010  . SHOULDER PAIN, LEFT 10/20/2008    Past Surgical History:  Procedure Laterality Date  . ANAL FISSURE REPAIR  07/28/2011   Procedure: ANAL FISSURE REPAIR;  Surgeon: Adin Hector, MD;  [facility];  Service: General;  Laterality: N/A;  Repair Anal Fissure/sphinterotomy and excision rectal polypl  . BACK SURGERY  2000   "had 2 cages put in"  . BIOPSY PANCREAS  2010   benign  . HERNIA REPAIR  4081   umbilical  . KIDNEY STONES  2010 & 2008 & 2005  . LAPAROSCOPIC INCISIONAL / UMBILICAL / VENTRAL HERNIA REPAIR  07/08/11   VHR  . left rotator cuff  02/26/2011  . left shoulder bone spurs  2011  . LIPOMA EXCISION     "several; off back, arms"  . pancreatic tumor  2010  . right rotator cuff     2011  . TONSILLECTOMY  1967  . TOTAL KNEE ARTHROPLASTY Left 04/28/2017   Procedure: LEFT  TOTAL KNEE ARTHROPLASTY;  Surgeon: Paralee Cancel, MD;  [facility];  Service: Orthopedics;  Laterality: Left;  Marland Kitchen VASECTOMY  1994  . VENTRAL HERNIA REPAIR  07/08/2011   Procedure: LAPAROSCOPIC VENTRAL HERNIA;  Surgeon: Adin Hector, MD;  Location: [facility];  Service: General;  Laterality: N/A;  Laparoscopic repair ventral hernia with mesh, Lysis of adhesions.    There were no vitals filed for this visit.      Subjective Assessment - 06/05/17 0755    Subjective "Im am wonderful today"   Currently in Pain? Yes   Pain Score 2    Pain Location Knee   Pain Orientation Left            [facility] PT Assessment - 06/05/17 0001      AROM   AROM Assessment Site Knee  Supine   Right/Left Knee Left   Left Knee Extension 5   Left Knee Flexion 125                     [facility] Adult PT Treatment/Exercise - 06/05/17 0001      Ambulation/Gait   Stairs Yes   Stairs Assistance 6: Modified independent (Device/Increase time);5: Supervision   Stair Management Technique One rail  Right;Alternating pattern   Number of Stairs 12   Height of Stairs 6   Gait Comments Some weakness and antrior knee pain LLE with desents     Knee/Hip Exercises: Aerobic   Elliptical I 15 R5 41fd/2rev   Recumbent Bike Seat 11 L3 x 471m    Nustep x4 min L5 LE only      Knee/Hip Exercises: Machines for Strengthening   Cybex Knee Extension Eccentrics LLE 3x5    Cybex Knee Flexion 45lb 2x15; LLE 25lb 2x10    Cybex Leg Press 60lb 3x10; LLE 20lb 4x5     Knee/Hip Exercises: Standing   Heel Raises 2 sets;15 reps;2 seconds;Left   Hip Flexion Left;2 sets;15 reps;Knee bent   Hip Flexion Limitations 3   Forward Step Up Left;2 sets;10 reps;Hand Hold: 1;Step Height: 6";Hand Hold: 2  3lb cuff weight on LLE      Knee/Hip Exercises: Seated   Long Arc Quad Left;10 reps;2 sets   [employer] 3 lbs.     Vasopneumatic   Number Minutes Vasopneumatic  15 minutes   Vasopnuematic Location  Knee    Vasopneumatic Pressure Medium   Vasopneumatic Temperature  33                  PT Short Term Goals - 05/13/17 0842      PT SHORT TERM GOAL #1   Title independent with initial HEP   Status Achieved           PT Long Term Goals - 05/18/17 085625    PT LONG TERM GOAL #1   Title decrease pain 50%   Status Partially Met     PT LONG TERM GOAL #2   Title walk without assistive device   Status On-going     PT LONG TERM GOAL #3   Title increase AROM of the left knee to 5-115 degrees flexion   Status Partially Met     PT LONG TERM GOAL #4   Title go up and down stairs step over step   Status On-going               Plan - 06/05/17 086389  Clinical Impression Statement Progressed to stair negotiation, c/o anterior R knee pain with descents. AROM take in supine position. Pt demos better strength with SL on leg press. Some pain with eccentric leg extension with RLE.    Rehab Potential Good   PT Frequency 3x / week   PT Duration 8 weeks   PT Treatment/Interventions ADLs/Self Care Home Management;Cryotherapy;Electrical Stimulation;Gait training;Stair training;Functional mobility training;Patient/family education;Balance training;Therapeutic exercise;Therapeutic activities;Neuromuscular re-education;Manual techniques;Vasopneumatic Device   PT Next Visit Plan progress as tolerated      Patient will benefit from skilled therapeutic intervention in order to improve the following deficits and impairments:  Abnormal gait, Cardiopulmonary status limiting activity, Decreased activity tolerance, Decreased balance, Decreased mobility, Decreased strength, Increased edema, Decreased scar mobility, Pain, Decreased range of motion, Difficulty walking  Visit Diagnosis: Localized swelling, mass and lump, left lower limb  Stiffness of left knee, not elsewhere classified  Acute pain of left knee  Difficulty in walking, not elsewhere classified     Problem List Patient  Active Problem List   Diagnosis Date Noted  . S/P left TKA 04/28/2017  . S/P total knee replacement 04/28/2017  . Situational anxiety 03/17/2016  . Low testosterone 03/13/2015  . Osteoarthritis, multiple sites 08/22/2013  . Obesity (BMI 30-39.9) 05/08/2013  . Primary pancreatic neuroendocrine tumor  10/07/2012  . Liver nodule 10/07/2012  . History of kidney stones 08/27/2012  . 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 Rotator cuff tear, left 01/23/2011  . Hyperlipidemia 10/11/2010  . BURSITIS, LEFT HIP 05/20/2010  . Controlled type 2 diabetes mellitus without complication ([place]) 41/63/8453  . CHRONIC TENSION TYPE HEADACHE 03/30/2009  . ELEVATED BLOOD PRESSURE 03/30/2009  . SHOULDER PAIN, LEFT 10/20/2008  . CONTUSION OF KNEE 10/20/2008  . ABNORMAL FINDINGS GI TRACT 06/23/2008    Scot Jun, PTA 06/05/2017, 8:44 AM  [address] [place], [place], [address] Phone: [phone]   Fax:  [phone]  Name: [name] MRN: [id number] Date of Birth: 03-25-1961

## 2017-06-08 ENCOUNTER — Ambulatory Visit: Payer: BLUE CROSS/BLUE SHIELD | Admitting: Physical Therapy

## 2017-06-08 ENCOUNTER — Encounter: Payer: Self-pay | Admitting: Physical Therapy

## 2017-06-08 DIAGNOSIS — M25662 Stiffness of left knee, not elsewhere classified: Secondary | ICD-10-CM | POA: Diagnosis not present

## 2017-06-08 DIAGNOSIS — R262 Difficulty in walking, not elsewhere classified: Secondary | ICD-10-CM

## 2017-06-08 DIAGNOSIS — M25562 Pain in left knee: Secondary | ICD-10-CM | POA: Diagnosis not present

## 2017-06-08 DIAGNOSIS — R2242 Localized swelling, mass and lump, left lower limb: Secondary | ICD-10-CM

## 2017-06-08 NOTE — Therapy (Signed)
[address] [place], [place], [address] Phone: [phone]   Fax:  [phone]  Physical Therapy Treatment  Patient Details  Name: [name] MRN: [id number] Date of Birth: [birth year] Referring Provider: Alvan Dame   Encounter Date: 06/08/2017  PT End of Session - 06/08/17 0839    Visit Number  17    Date for PT Re-Evaluation  07/01/17    PT Start Time  0754    PT Stop Time  0853    PT Time Calculation (min)  59 min    Activity Tolerance  Patient tolerated treatment well    Behavior During Therapy  [facility] for tasks assessed/performed       Past Medical History:  Diagnosis Date  . Arthritis   . BURSITIS, LEFT HIP 05/20/2010  . Cancer [facility]) 2010   pancreatic cancer  . Chronic kidney disease    stones; 13 stones, currently has 2 stones   . Claustrophobia    "take Lorazepam if I have to fly"  . DIAB W/O COMP TYPE II/UNS NOT STATED UNCNTRL 03/30/2009  . ELEVATED BLOOD PRESSURE 03/30/2009  . Fissure, anal    occ bleeds still  . Headache(784.0)    "only when I was working; had headaches and migraines"  . Hyperlipidemia 10/11/2010  . SHOULDER PAIN, LEFT 10/20/2008    Past Surgical History:  Procedure Laterality Date  . BACK SURGERY  2000   "had 2 cages put in"  . BIOPSY PANCREAS  2010   benign  . HERNIA REPAIR  [id number]   umbilical  . KIDNEY STONES  2010 & 2008 & 2005  . LAPAROSCOPIC INCISIONAL / UMBILICAL / VENTRAL HERNIA REPAIR  07/08/11   VHR  . left rotator cuff  02/26/2011  . left shoulder bone spurs  2011  . LIPOMA EXCISION     "several; off back, arms"  . pancreatic tumor  2010  . right rotator cuff     2011  . TONSILLECTOMY  1967  . VASECTOMY  1994    There were no vitals filed for this visit.  Subjective Assessment - 06/08/17 0756    Subjective  Pt reports that his knee got real sore over the weekend. Pt reports that he had to fix a fence over the weekend.    Currently in Pain?  Yes    Pain Score  3     Pain Location  Knee    Pain Orientation  Left;Anterior;Lateral                      [facility] Adult PT Treatment/Exercise - 06/08/17 0001      Knee/Hip Exercises: Aerobic   Elliptical  I 15 R5 70fd/2rev    Recumbent Bike  Seat 11 L3 x 48m     Nustep  x4 min L5 LE only       Knee/Hip Exercises: Machines for Strengthening   Cybex Knee Extension  Eccentrics LLE 5lb 3x5     Cybex Knee Flexion  45lb 2x15; LLE 25lb 2x10     Cybex Leg Press  50lb 2x15; LLE 30lb 4x5      Knee/Hip Exercises: Standing   Heel Raises  2 sets;15 reps;2 seconds;Left    Forward Step Up  Left;2 sets;10 reps;Step Height: 8"    Walking with Sports Cord  50lb 4way x 3 each      Knee/Hip Exercises: Seated   Long Arc Quad  Left;10 reps;2 sets  Long Arc Quad Weight  5 lbs.      Modalities   Modalities  Vasopneumatic      Vasopneumatic   Number Minutes Vasopneumatic   15 minutes    Vasopnuematic Location   Knee    Vasopneumatic Pressure  Medium    Vasopneumatic Temperature   33               PT Short Term Goals - 05/13/17 0842      PT SHORT TERM GOAL #1   Title  independent with initial HEP    Status  Achieved        PT Long Term Goals - 05/18/17 1610      PT LONG TERM GOAL #1   Title  decrease pain 50%    Status  Partially Met      PT LONG TERM GOAL #2   Title  walk without assistive device    Status  On-going      PT LONG TERM GOAL #3   Title  increase AROM of the left knee to 5-115 degrees flexion    Status  Partially Met      PT LONG TERM GOAL #4   Title  go up and down stairs step over step    Status  On-going            Plan - 06/08/17 0839    Clinical Impression Statement  Pt continues to do well in therapy. Pt does demos som quad weakness with LAQ, mild instability with resisted side step. Increase load tolerated wit SL leg press.    Rehab Potential  Good    PT Frequency  3x / week    PT Duration  8 weeks    PT  Treatment/Interventions  ADLs/Self Care Home Management;Cryotherapy;Electrical Stimulation;Gait training;Stair training;Functional mobility training;Patient/family education;Balance training;Therapeutic exercise;Therapeutic activities;Neuromuscular re-education;Manual techniques;Vasopneumatic Device    PT Next Visit Plan  progress as tolerated       Patient will benefit from skilled therapeutic intervention in order to improve the following deficits and impairments:  Abnormal gait, Cardiopulmonary status limiting activity, Decreased activity tolerance, Decreased balance, Decreased mobility, Decreased strength, Increased edema, Decreased scar mobility, Pain, Decreased range of motion, Difficulty walking  Visit Diagnosis: Localized swelling, mass and lump, left lower limb  Stiffness of left knee, not elsewhere classified  Acute pain of left knee  Difficulty in walking, not elsewhere classified     Problem List Patient Active Problem List   Diagnosis Date Noted  . S/P left TKA 04/28/2017  . S/P total knee replacement 04/28/2017  . Situational anxiety 03/17/2016  . Low testosterone 03/13/2015  . Osteoarthritis, multiple sites 08/22/2013  . Obesity (BMI 30-39.9) 05/08/2013  . 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  . Liver nodule 10/07/2012  . History of kidney stones 08/27/2012  . 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 Rotator cuff tear, left 01/23/2011  . Hyperlipidemia 10/11/2010  . BURSITIS, LEFT HIP 05/20/2010  . Controlled type 2 diabetes mellitus without complication ([place]) 96/11/5407  . CHRONIC TENSION TYPE HEADACHE 03/30/2009  . ELEVATED BLOOD PRESSURE 03/30/2009  . SHOULDER PAIN, LEFT 10/20/2008  . CONTUSION OF KNEE 10/20/2008  . ABNORMAL FINDINGS GI TRACT 06/23/2008    Scot Jun, PTA 06/08/2017, 8:41 AM  [address] [place], [place], [address] Phone: [phone]   Fax:   [phone]  Name: FRANKLIN BAUMBACH MRN: [id number] Date of Birth: 1961-02-27

## 2017-06-10 ENCOUNTER — Ambulatory Visit: Payer: BLUE CROSS/BLUE SHIELD | Admitting: Physical Therapy

## 2017-06-10 ENCOUNTER — Encounter: Payer: Self-pay | Admitting: Family Medicine

## 2017-06-10 ENCOUNTER — Encounter: Payer: Self-pay | Admitting: Physical Therapy

## 2017-06-10 ENCOUNTER — Ambulatory Visit (INDEPENDENT_AMBULATORY_CARE_PROVIDER_SITE_OTHER): Payer: BLUE CROSS/BLUE SHIELD | Admitting: Family Medicine

## 2017-06-10 VITALS — BP 110/80 | HR 82 | Temp 98.1°F | Wt 274.2 lb

## 2017-06-10 DIAGNOSIS — R262 Difficulty in walking, not elsewhere classified: Secondary | ICD-10-CM | POA: Diagnosis not present

## 2017-06-10 DIAGNOSIS — Z23 Encounter for immunization: Secondary | ICD-10-CM | POA: Diagnosis not present

## 2017-06-10 DIAGNOSIS — E119 Type 2 diabetes mellitus without complications: Secondary | ICD-10-CM

## 2017-06-10 DIAGNOSIS — M25662 Stiffness of left knee, not elsewhere classified: Secondary | ICD-10-CM

## 2017-06-10 DIAGNOSIS — R2242 Localized swelling, mass and lump, left lower limb: Secondary | ICD-10-CM

## 2017-06-10 DIAGNOSIS — Z471 Aftercare following joint replacement surgery: Secondary | ICD-10-CM | POA: Diagnosis not present

## 2017-06-10 DIAGNOSIS — M25562 Pain in left knee: Secondary | ICD-10-CM

## 2017-06-10 DIAGNOSIS — Z96652 Presence of left artificial knee joint: Secondary | ICD-10-CM | POA: Diagnosis not present

## 2017-06-10 NOTE — Therapy (Signed)
[address] [place], [place], [address] Phone: [phone]   Fax:  [phone]  Physical Therapy Treatment  Patient Details  Name: [name] MRN: [id number] Date of Birth: [birth year] Referring Provider: Alvan Dame   Encounter Date: 06/10/2017  PT End of Session - 06/10/17 0844    Visit Number  18    Date for PT Re-Evaluation  07/01/17    PT Start Time  0754    PT Stop Time  0858    PT Time Calculation (min)  64 min    Activity Tolerance  Patient tolerated treatment well    Behavior During Therapy  [facility] for tasks assessed/performed       Past Medical History:  Diagnosis Date  . Arthritis   . BURSITIS, LEFT HIP 05/20/2010  . Cancer [facility]) 2010   pancreatic cancer  . Chronic kidney disease    stones; 13 stones, currently has 2 stones   . Claustrophobia    "take Lorazepam if I have to fly"  . DIAB W/O COMP TYPE II/UNS NOT STATED UNCNTRL 03/30/2009  . ELEVATED BLOOD PRESSURE 03/30/2009  . Fissure, anal    occ bleeds still  . Headache(784.0)    "only when I was working; had headaches and migraines"  . Hyperlipidemia 10/11/2010  . SHOULDER PAIN, LEFT 10/20/2008    Past Surgical History:  Procedure Laterality Date  . BACK SURGERY  2000   "had 2 cages put in"  . BIOPSY PANCREAS  2010   benign  . HERNIA REPAIR  [id number]   umbilical  . KIDNEY STONES  2010 & 2008 & 2005  . LAPAROSCOPIC INCISIONAL / UMBILICAL / VENTRAL HERNIA REPAIR  07/08/11   VHR  . left rotator cuff  02/26/2011  . left shoulder bone spurs  2011  . LIPOMA EXCISION     "several; off back, arms"  . pancreatic tumor  2010  . right rotator cuff     2011  . TONSILLECTOMY  1967  . VASECTOMY  1994    There were no vitals filed for this visit.  Subjective Assessment - 06/10/17 0756    Subjective  "Knee has been sore the last couple days, my knee swollen up on me yesterday, I had to sleep with the sock on it last night"    Currently in Pain?   Yes    Pain Score  3     Pain Location  Knee         [facility] PT Assessment - 06/10/17 0001      AROM   AROM Assessment Site  Knee    Right/Left Knee  Left    Left Knee Extension  3    Left Knee Flexion  124                  [facility] Adult PT Treatment/Exercise - 06/10/17 0001      Knee/Hip Exercises: Aerobic   Elliptical  I 15 R5 61fd/2rev    Recumbent Bike  Seat 11 L3 x 416m     Nustep  x5 min L5 LE only       Knee/Hip Exercises: Machines for Strengthening   Cybex Knee Extension  Eccentrics LLE 5lb 3x5     Cybex Knee Flexion  LLE 25lb 2x15    Cybex Leg Press  60lb 2x15; LLE 20lb 3x5      Knee/Hip Exercises: Standing   Lateral Step Up  Left;2 sets;10 reps;Hand Hold: 0;Step Height:  6"    Walking with Sports Cord  50lb side step  x 5 each      Knee/Hip Exercises: Seated   Long Arc Quad  Left;10 reps;2 sets    Long Arc Quad Weight  5 lbs.    Sit to Sand  2 sets;10 reps;without UE support               PT Short Term Goals - 05/13/17 0842      PT SHORT TERM GOAL #1   Title  independent with initial HEP    Status  Achieved        PT Long Term Goals - 05/18/17 7989      PT LONG TERM GOAL #1   Title  decrease pain 50%    Status  Partially Met      PT LONG TERM GOAL #2   Title  walk without assistive device    Status  On-going      PT LONG TERM GOAL #3   Title  increase AROM of the left knee to 5-115 degrees flexion    Status  Partially Met      PT LONG TERM GOAL #4   Title  go up and down stairs step over step    Status  On-going            Plan - 06/10/17 0845    Clinical Impression Statement  No issues with today's interventions. Pt still demos some eccentric load weakness with RLE. Pt ambulated with a mild limp. Increase weight tolerated on leg press     Rehab Potential  Good    PT Frequency  3x / week    PT Treatment/Interventions  ADLs/Self Care Home Management;Cryotherapy;Electrical Stimulation;Gait training;Stair  training;Functional mobility training;Patient/family education;Balance training;Therapeutic exercise;Therapeutic activities;Neuromuscular re-education;Manual techniques;Vasopneumatic Device    PT Next Visit Plan  progress as tolerated       Patient will benefit from skilled therapeutic intervention in order to improve the following deficits and impairments:  Abnormal gait, Cardiopulmonary status limiting activity, Decreased activity tolerance, Decreased balance, Decreased mobility, Decreased strength, Increased edema, Decreased scar mobility, Pain, Decreased range of motion, Difficulty walking  Visit Diagnosis: Localized swelling, mass and lump, left lower limb  Acute pain of left knee  Difficulty in walking, not elsewhere classified  Stiffness of left knee, not elsewhere classified     Problem List Patient Active Problem List   Diagnosis Date Noted  . S/P left TKA 04/28/2017  . S/P total knee replacement 04/28/2017  . Situational anxiety 03/17/2016  . Low testosterone 03/13/2015  . Osteoarthritis, multiple sites 08/22/2013  . Obesity (BMI 30-39.9) 05/08/2013  . 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  . Liver nodule 10/07/2012  . History of kidney stones 08/27/2012  . 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 Rotator cuff tear, left 01/23/2011  . Hyperlipidemia 10/11/2010  . BURSITIS, LEFT HIP 05/20/2010  . Controlled type 2 diabetes mellitus without complication ([place]) 21/19/4174  . CHRONIC TENSION TYPE HEADACHE 03/30/2009  . ELEVATED BLOOD PRESSURE 03/30/2009  . SHOULDER PAIN, LEFT 10/20/2008  . CONTUSION OF KNEE 10/20/2008  . ABNORMAL FINDINGS GI TRACT 06/23/2008    Scot Jun, PTA 06/10/2017, 8:47 AM  [address] [place], [place], [address] Phone: [phone]   Fax:  [phone]  Name: LEE KUANG MRN: [id number] Date of Birth: Oct 29, 1960

## 2017-06-10 NOTE — Progress Notes (Signed)
Subjective:     Patient ID: [name], male   DOB: [birth year], 56 y.o.   MRN: [id number]  HPI   Follow-up type 2 diabetes. He had some weight gain over the past couple years . related to decreased activity from severe arthritis left knee. He had pre-operative A1C of 9.0% and we added Jardiance to his metformin and A1c prior to surgery 2 months ago 7.9%. Surgery was uneventful. He is 6 weeks out and recovering well. Fasting blood sugars ranging 125 to 145. He's had prior history of diarrhea with immediate acting metformin higher dose. His goal is to try to come off medications with further weight loss.  Past Medical History:  Diagnosis Date  . Arthritis   . BURSITIS, LEFT HIP 05/20/2010  . Cancer [facility]) 2010   pancreatic cancer  . Chronic kidney disease    stones; 13 stones, currently has 2 stones   . Claustrophobia    "take Lorazepam if I have to fly"  . DIAB W/O COMP TYPE II/UNS NOT STATED UNCNTRL 03/30/2009  . ELEVATED BLOOD PRESSURE 03/30/2009  . Fissure, anal    occ bleeds still  . Headache(784.0)    "only when I was working; had headaches and migraines"  . Hyperlipidemia 10/11/2010  . SHOULDER PAIN, LEFT 10/20/2008   Past Surgical History:  Procedure Laterality Date  . BACK SURGERY  2000   "had 2 cages put in"  . BIOPSY PANCREAS  2010   benign  . HERNIA REPAIR  [id number]   umbilical  . KIDNEY STONES  2010 & 2008 & 2005  . LAPAROSCOPIC INCISIONAL / UMBILICAL / VENTRAL HERNIA REPAIR  07/08/11   VHR  . left rotator cuff  02/26/2011  . left shoulder bone spurs  2011  . LIPOMA EXCISION     "several; off back, arms"  . pancreatic tumor  2010  . right rotator cuff     2011  . TONSILLECTOMY  1967  . VASECTOMY  1994    reports that  has never smoked. he has never used smokeless tobacco. He reports that he does not drink alcohol or use drugs. family history includes Cancer in his maternal grandfather; Colon cancer (age of onset: 62) in his maternal grandfather; Diabetes in his  brother; Heart attack (age of onset: 36) in his father; Heart disease in his father; Stroke in his father. Allergies  Allergen Reactions  . Merthiolate [Thimerosol]     Unknown reaction  . Morphine     REACTION: paronia  . Nickel Dermatitis     Review of Systems  Respiratory: Negative for shortness of breath.   Cardiovascular: Negative for chest pain.  Endocrine: Negative for polydipsia and polyuria.  Neurological: Negative for dizziness.       Objective:   Physical Exam  Constitutional: He appears well-developed and well-nourished.  Cardiovascular: Normal rate and regular rhythm.  Pulmonary/Chest: Effort normal and breath sounds normal. No respiratory distress. He has no wheezes. He has no rales.  Musculoskeletal: He exhibits no edema.       Assessment:     Type 2 diabetes with recent poor control but improving    Plan:     -We recommended giving this another 2 months on dual therapy with metformin and Jardiance. -Follow-up in 2 months and repeat A1c then. He does have concerns about potential cost issues going forward and hopefully we can taper him back off some medications at follow-up  If A1C further improved.  [name] MD [facility]

## 2017-06-12 ENCOUNTER — Ambulatory Visit: Payer: BLUE CROSS/BLUE SHIELD | Admitting: Physical Therapy

## 2017-06-12 ENCOUNTER — Encounter: Payer: Self-pay | Admitting: Physical Therapy

## 2017-06-12 DIAGNOSIS — R262 Difficulty in walking, not elsewhere classified: Secondary | ICD-10-CM

## 2017-06-12 DIAGNOSIS — R2242 Localized swelling, mass and lump, left lower limb: Secondary | ICD-10-CM

## 2017-06-12 DIAGNOSIS — M25562 Pain in left knee: Secondary | ICD-10-CM | POA: Diagnosis not present

## 2017-06-12 DIAGNOSIS — M25662 Stiffness of left knee, not elsewhere classified: Secondary | ICD-10-CM | POA: Diagnosis not present

## 2017-06-12 NOTE — Therapy (Signed)
[address] [place], [place], [address] Phone: [phone]   Fax:  [phone]  Physical Therapy Treatment  Patient Details  Name: [name] MRN: [id number] Date of Birth: 03-15-1961 Referring Provider: Alvan Dame   Encounter Date: 06/12/2017  PT End of Session - 06/12/17 0836    Visit Number  19    Date for PT Re-Evaluation  07/01/17    PT Start Time  0754    PT Stop Time  0850    PT Time Calculation (min)  56 min    Activity Tolerance  Patient tolerated treatment well;Patient limited by pain    Behavior During Therapy  [facility] for tasks assessed/performed       Past Medical History:  Diagnosis Date  . Arthritis   . BURSITIS, LEFT HIP 05/20/2010  . Cancer [facility]) 2010   pancreatic cancer  . Chronic kidney disease    stones; 13 stones, currently has 2 stones   . Claustrophobia    "take Lorazepam if I have to fly"  . DIAB W/O COMP TYPE II/UNS NOT STATED UNCNTRL 03/30/2009  . ELEVATED BLOOD PRESSURE 03/30/2009  . Fissure, anal    occ bleeds still  . Headache(784.0)    "only when I was working; had headaches and migraines"  . Hyperlipidemia 10/11/2010  . SHOULDER PAIN, LEFT 10/20/2008    Past Surgical History:  Procedure Laterality Date  . BACK SURGERY  2000   "had 2 cages put in"  . BIOPSY PANCREAS  2010   benign  . HERNIA REPAIR  [id number]   umbilical  . KIDNEY STONES  2010 & 2008 & 2005  . LAPAROSCOPIC INCISIONAL / UMBILICAL / VENTRAL HERNIA REPAIR  07/08/11   VHR  . left rotator cuff  02/26/2011  . left shoulder bone spurs  2011  . LIPOMA EXCISION     "several; off back, arms"  . pancreatic tumor  2010  . right rotator cuff     2011  . TONSILLECTOMY  1967  . VASECTOMY  1994    There were no vitals filed for this visit.  Subjective Assessment - 06/12/17 0759    Subjective  "Im sore, I went for a mile walk last night"    Currently in Pain?  Yes    Pain Score  4     Pain Location  Knee    Pain  Orientation  Left    Pain Descriptors / Indicators  Sore                      [facility] Adult PT Treatment/Exercise - 06/12/17 0001      Knee/Hip Exercises: Aerobic   Elliptical  I 15 R5 23fd/2rev    Recumbent Bike  Seat 11 L3 x 49m     Nustep  x5 min L5 LE only       Knee/Hip Exercises: Machines for Strengthening   Cybex Knee Extension  Eccentrics LLE 10lb 3x5     Cybex Knee Flexion  LLE 25lb 2x15    Cybex Leg Press  60lb 2x15; LLE 20lb 3x5      Knee/Hip Exercises: Standing   Heel Raises  2 sets;15 reps;2 seconds;Left    Lateral Step Up  Left;2 sets;10 reps;Hand Hold: 0;Step Height: 6"    Forward Step Up  Left;10 reps;Step Height: 6";1 set    Walking with Sports Cord  50lb side step  x 5 each  Knee/Hip Exercises: Supine   Straight Leg Raises  Left;AROM;1 set;10 reps    Straight Leg Raise with External Rotation  Left;2 sets;10 reps;Strengthening      Vasopneumatic   Number Minutes Vasopneumatic   15 minutes    Vasopnuematic Location   Knee    Vasopneumatic Pressure  Medium    Vasopneumatic Temperature   33               PT Short Term Goals - 05/13/17 6294      PT SHORT TERM GOAL #1   Title  independent with initial HEP    Status  Achieved        PT Long Term Goals - 05/18/17 7654      PT LONG TERM GOAL #1   Title  decrease pain 50%    Status  Partially Met      PT LONG TERM GOAL #2   Title  walk without assistive device    Status  On-going      PT LONG TERM GOAL #3   Title  increase AROM of the left knee to 5-115 degrees flexion    Status  Partially Met      PT LONG TERM GOAL #4   Title  go up and down stairs step over step    Status  On-going            Plan - 06/12/17 0836    Clinical Impression Statement  Pt with increase soreness and pain due to walking a mile last night. Therapy session backed down due to pt complaints. Increase soreness with leg press, pt also reporting some compensation during leg press using RLE  more.     Rehab Potential  Good    PT Frequency  3x / week    PT Duration  8 weeks    PT Treatment/Interventions  ADLs/Self Care Home Management;Cryotherapy;Electrical Stimulation;Gait training;Stair training;Functional mobility training;Patient/family education;Balance training;Therapeutic exercise;Therapeutic activities;Neuromuscular re-education;Manual techniques;Vasopneumatic Device    PT Next Visit Plan  progress as tolerated       Patient will benefit from skilled therapeutic intervention in order to improve the following deficits and impairments:  Abnormal gait, Cardiopulmonary status limiting activity, Decreased activity tolerance, Decreased balance, Decreased mobility, Decreased strength, Increased edema, Decreased scar mobility, Pain, Decreased range of motion, Difficulty walking  Visit Diagnosis: Localized swelling, mass and lump, left lower limb  Acute pain of left knee  Difficulty in walking, not elsewhere classified     Problem List Patient Active Problem List   Diagnosis Date Noted  . S/P left TKA 04/28/2017  . S/P total knee replacement 04/28/2017  . Situational anxiety 03/17/2016  . Low testosterone 03/13/2015  . Osteoarthritis, multiple sites 08/22/2013  . Obesity (BMI 30-39.9) 05/08/2013  . 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  . Liver nodule 10/07/2012  . History of kidney stones 08/27/2012  . 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 Rotator cuff tear, left 01/23/2011  . Hyperlipidemia 10/11/2010  . BURSITIS, LEFT HIP 05/20/2010  . Controlled type 2 diabetes mellitus without complication ([place]) 65/10/5463  . CHRONIC TENSION TYPE HEADACHE 03/30/2009  . ELEVATED BLOOD PRESSURE 03/30/2009  . SHOULDER PAIN, LEFT 10/20/2008  . CONTUSION OF KNEE 10/20/2008  . ABNORMAL FINDINGS GI TRACT 06/23/2008    Scot Jun 06/12/2017, 8:39 AM  [address] [place], [place],  [address] Phone: [phone]   Fax:  [phone]  Name: [name] MRN: [id number] Date of  Birth: 21-May-1961

## 2017-06-15 ENCOUNTER — Encounter: Payer: Self-pay | Admitting: Physical Therapy

## 2017-06-15 ENCOUNTER — Ambulatory Visit: Payer: BLUE CROSS/BLUE SHIELD | Admitting: Physical Therapy

## 2017-06-15 DIAGNOSIS — M25562 Pain in left knee: Secondary | ICD-10-CM

## 2017-06-15 DIAGNOSIS — M25662 Stiffness of left knee, not elsewhere classified: Secondary | ICD-10-CM | POA: Diagnosis not present

## 2017-06-15 DIAGNOSIS — R2242 Localized swelling, mass and lump, left lower limb: Secondary | ICD-10-CM

## 2017-06-15 DIAGNOSIS — R262 Difficulty in walking, not elsewhere classified: Secondary | ICD-10-CM

## 2017-06-15 NOTE — Therapy (Signed)
[address] [place], [place], [address] Phone: [phone]   Fax:  [phone]  Physical Therapy Treatment  Patient Details  Name: [name] MRN: [id number] Date of Birth: 1961/05/20 Referring Provider: Alvan Dame   Encounter Date: 06/15/2017  PT End of Session - 06/15/17 0840    Visit Number  20    Date for PT Re-Evaluation  07/01/17    PT Start Time  0750    PT Stop Time  0854    PT Time Calculation (min)  64 min    Activity Tolerance  Patient tolerated treatment well    Behavior During Therapy  [facility] for tasks assessed/performed       Past Medical History:  Diagnosis Date  . Arthritis   . BURSITIS, LEFT HIP 05/20/2010  . Cancer [facility]) 2010   pancreatic cancer  . Chronic kidney disease    stones; 13 stones, currently has 2 stones   . Claustrophobia    "take Lorazepam if I have to fly"  . DIAB W/O COMP TYPE II/UNS NOT STATED UNCNTRL 03/30/2009  . ELEVATED BLOOD PRESSURE 03/30/2009  . Fissure, anal    occ bleeds still  . Headache(784.0)    "only when I was working; had headaches and migraines"  . Hyperlipidemia 10/11/2010  . SHOULDER PAIN, LEFT 10/20/2008    Past Surgical History:  Procedure Laterality Date  . BACK SURGERY  2000   "had 2 cages put in"  . BIOPSY PANCREAS  2010   benign  . HERNIA REPAIR  1497   umbilical  . KIDNEY STONES  2010 & 2008 & 2005  . LAPAROSCOPIC INCISIONAL / UMBILICAL / VENTRAL HERNIA REPAIR  07/08/11   V[facility]  . left rotator cuff  02/26/2011  . left shoulder bone spurs  2011  . LIPOMA EXCISION     "several; off back, arms"  . pancreatic tumor  2010  . right rotator cuff     2011  . TONSILLECTOMY  1967  . VASECTOMY  1994    There were no vitals filed for this visit.  Subjective Assessment - 06/15/17 0758    Subjective  "Knee is a little stiff and sore, but other than that no too bad"    Currently in Pain?  Yes    Pain Score  3     Pain Location  Knee    Pain  Orientation  Left                      [facility] Adult PT Treatment/Exercise - 06/15/17 0001      High Level Balance   High Level Balance Comments  rebound ball toss 3 way x10, SLS on airex rebound ball toss 2x10       Knee/Hip Exercises: Aerobic   Elliptical  I 15 R8 62fd/2rev    Recumbent Bike  Seat 11 L3 x 670m     Nustep  x6 min L5 LE only       Knee/Hip Exercises: Machines for Strengthening   Cybex Knee Extension  LLE 5lb  2x5    Cybex Knee Flexion  LLE 25lb 2x15    Cybex Leg Press  60lb 2x15; LLE 20lb 3x5      Knee/Hip Exercises: Standing   Heel Raises  2 seconds;Left;10 reps;3 sets    Hip Flexion  Left;2 sets;Knee bent;10 reps    Hip Flexion Limitations  3    Lateral Step Up  Left;2  sets;10 reps;Hand Hold: 0;Step Height: 6"      Knee/Hip Exercises: Seated   Sit to Sand  2 sets;10 reps;without UE support red band TKE resistance       Vasopneumatic   Number Minutes Vasopneumatic   15 minutes    Vasopnuematic Location   Knee    Vasopneumatic Pressure  Medium    Vasopneumatic Temperature   33               PT Short Term Goals - 05/13/17 8119      PT SHORT TERM GOAL #1   Title  independent with initial HEP    Status  Achieved        PT Long Term Goals - 05/18/17 1478      PT LONG TERM GOAL #1   Title  decrease pain 50%    Status  Partially Met      PT LONG TERM GOAL #2   Title  walk without assistive device    Status  On-going      PT LONG TERM GOAL #3   Title  increase AROM of the left knee to 5-115 degrees flexion    Status  Partially Met      PT LONG TERM GOAL #4   Title  go up and down stairs step over step    Status  On-going            Plan - 06/15/17 0841    Clinical Impression Statement  Pt with a better tolerance to today's activities. Good strength on all machine level interventions. Pt still has difficulty with LAQ on machine. Some compensation with functional interventions.     Rehab Potential  Good    PT  Frequency  3x / week    PT Duration  8 weeks    PT Treatment/Interventions  ADLs/Self Care Home Management;Cryotherapy;Electrical Stimulation;Gait training;Stair training;Functional mobility training;Patient/family education;Balance training;Therapeutic exercise;Therapeutic activities;Neuromuscular re-education;Manual techniques;Vasopneumatic Device    PT Next Visit Plan  progress as tolerated       Patient will benefit from skilled therapeutic intervention in order to improve the following deficits and impairments:  Abnormal gait, Cardiopulmonary status limiting activity, Decreased activity tolerance, Decreased balance, Decreased mobility, Decreased strength, Increased edema, Decreased scar mobility, Pain, Decreased range of motion, Difficulty walking  Visit Diagnosis: Localized swelling, mass and lump, left lower limb  Acute pain of left knee  Difficulty in walking, not elsewhere classified  Stiffness of left knee, not elsewhere classified     Problem List Patient Active Problem List   Diagnosis Date Noted  . S/P left TKA 04/28/2017  . S/P total knee replacement 04/28/2017  . Situational anxiety 03/17/2016  . Low testosterone 03/13/2015  . Osteoarthritis, multiple sites 08/22/2013  . Obesity (BMI 30-39.9) 05/08/2013  . 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  . Liver nodule 10/07/2012  . History of kidney stones 08/27/2012  . 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 Rotator cuff tear, left 01/23/2011  . Hyperlipidemia 10/11/2010  . BURSITIS, LEFT HIP 05/20/2010  . Controlled type 2 diabetes mellitus without complication ([place]) 29/56/2130  . CHRONIC TENSION TYPE HEADACHE 03/30/2009  . ELEVATED BLOOD PRESSURE 03/30/2009  . SHOULDER PAIN, LEFT 10/20/2008  . CONTUSION OF KNEE 10/20/2008  . ABNORMAL FINDINGS GI TRACT 06/23/2008    Scot Jun, PTA 06/15/2017, 8:44 AM  [address]  [place], [place], [address] Phone: [phone]   Fax:  [phone]  Name: Cody [place]xillo  Frye MRN: [id number] Date of Birth: 1960/11/11

## 2017-06-17 ENCOUNTER — Encounter: Payer: Self-pay | Admitting: Physical Therapy

## 2017-06-17 ENCOUNTER — Ambulatory Visit: Payer: BLUE CROSS/BLUE SHIELD | Admitting: Physical Therapy

## 2017-06-17 DIAGNOSIS — M25662 Stiffness of left knee, not elsewhere classified: Secondary | ICD-10-CM | POA: Diagnosis not present

## 2017-06-17 DIAGNOSIS — R262 Difficulty in walking, not elsewhere classified: Secondary | ICD-10-CM | POA: Diagnosis not present

## 2017-06-17 DIAGNOSIS — R2242 Localized swelling, mass and lump, left lower limb: Secondary | ICD-10-CM | POA: Diagnosis not present

## 2017-06-17 DIAGNOSIS — M25562 Pain in left knee: Secondary | ICD-10-CM | POA: Diagnosis not present

## 2017-06-17 NOTE — Therapy (Signed)
[address] [place], [place], [address] Phone: [phone]   Fax:  [phone]  Physical Therapy Treatment  Patient Details  Name: [name] MRN: [id number] Date of Birth: [birth year] Referring Provider: Alvan Dame   Encounter Date: 06/17/2017  PT End of Session - 06/17/17 0843    Visit Number  21    Date for PT Re-Evaluation  07/01/17    PT Start Time  0751    PT Stop Time  0857    PT Time Calculation (min)  66 min    Activity Tolerance  Patient tolerated treatment well    Behavior During Therapy  [facility] for tasks assessed/performed       Past Medical History:  Diagnosis Date  . Arthritis   . BURSITIS, LEFT HIP 05/20/2010  . Cancer [facility]) 2010   pancreatic cancer  . Chronic kidney disease    stones; 13 stones, currently has 2 stones   . Claustrophobia    "take Lorazepam if I have to fly"  . DIAB W/O COMP TYPE II/UNS NOT STATED UNCNTRL 03/30/2009  . ELEVATED BLOOD PRESSURE 03/30/2009  . Fissure, anal    occ bleeds still  . Headache(784.0)    "only when I was working; had headaches and migraines"  . Hyperlipidemia 10/11/2010  . SHOULDER PAIN, LEFT 10/20/2008    Past Surgical History:  Procedure Laterality Date  . BACK SURGERY  2000   "had 2 cages put in"  . BIOPSY PANCREAS  2010   benign  . HERNIA REPAIR  6438   umbilical  . KIDNEY STONES  2010 & 2008 & 2005  . LAPAROSCOPIC INCISIONAL / UMBILICAL / VENTRAL HERNIA REPAIR  07/08/11   VHR  . left rotator cuff  02/26/2011  . left shoulder bone spurs  2011  . LIPOMA EXCISION     "several; off back, arms"  . pancreatic tumor  2010  . right rotator cuff     2011  . TONSILLECTOMY  1967  . VASECTOMY  1994    There were no vitals filed for this visit.  Subjective Assessment - 06/17/17 0759    Subjective  "Knee is a little sore today"    Currently in Pain?  Yes    Pain Score  3     Pain Location  Knee    Pain Orientation  Left    Pain  Descriptors / Indicators  Sore                      [facility] Adult PT Treatment/Exercise - 06/17/17 0001      Ambulation/Gait   Stairs  Yes    Stairs Assistance  6: Modified independent (Device/Increase time);5: Supervision    Stair Management Technique  One rail Right;Alternating pattern    Number of Stairs  24    Height of Stairs  6    Gait Comments  Some weakness and antrior knee pain LLE with desents      High Level Balance   High Level Balance Comments  rebound ball toss 3 way x10, SLS on airex rebound ball toss 2x10       Knee/Hip Exercises: Aerobic   Elliptical  I 15 R8 10fd/2rev    Recumbent Bike  Seat 9 L3 x 556m     Nustep  x6 min L5 LE only       Knee/Hip Exercises: Machines for Strengthening   Cybex Knee Flexion  LLE 25lb 2x15  Cybex Leg Press  LLE 30lb 3x10       Knee/Hip Exercises: Standing   Hip Abduction  Both;1 set;15 reps;Knee straight    Abduction Limitations  5    Hip Extension  Both;1 set;15 reps;Knee straight    Extension Limitations  5    Lateral Step Up  Left;2 sets;10 reps;Hand Hold: 0;Step Height: 6" explosive     Walking with Sports Cord  50lb front/ side step  x 5 each with 6 in step up      Knee/Hip Exercises: Seated   Long Arc Quad  Left;10 reps;2 sets    [employer] Weight  5 lbs.      Vasopneumatic   Number Minutes Vasopneumatic   15 minutes    Vasopnuematic Location   Knee    Vasopneumatic Pressure  Medium    Vasopneumatic Temperature   33               PT Short Term Goals - 05/13/17 0842      PT SHORT TERM GOAL #1   Title  independent with initial HEP    Status  Achieved        PT Long Term Goals - 06/17/17 0835      PT LONG TERM GOAL #1   Title  decrease pain 50%    Status  Partially Met      PT LONG TERM GOAL #2   Title  walk without assistive device    Status  Achieved      PT LONG TERM GOAL #3   Title  increase AROM of the left knee to 5-115 degrees flexion      PT LONG TERM GOAL #4    Title  go up and down stairs step over step    Status  Partially Met            Plan - 06/17/17 0843    Clinical Impression Statement  Progressing towards all goals, pt reports increase L knee soreness with stair descents and exp;explosive lateral step ups. instability with resisted side step with step up. Good L knee extension with LAQ.     PT Frequency  3x / week    PT Duration  8 weeks    PT Treatment/Interventions  ADLs/Self Care Home Management;Cryotherapy;Electrical Stimulation;Gait training;Stair training;Functional mobility training;Patient/family education;Balance training;Therapeutic exercise;Therapeutic activities;Neuromuscular re-education;Manual techniques;Vasopneumatic Device    PT Next Visit Plan  progress as tolerated       Patient will benefit from skilled therapeutic intervention in order to improve the following deficits and impairments:  Abnormal gait, Cardiopulmonary status limiting activity, Decreased activity tolerance, Decreased balance, Decreased mobility, Decreased strength, Increased edema, Decreased scar mobility, Pain, Decreased range of motion, Difficulty walking  Visit Diagnosis: Localized swelling, mass and lump, left lower limb  Acute pain of left knee  Difficulty in walking, not elsewhere classified  Stiffness of left knee, not elsewhere classified     Problem List Patient Active Problem List   Diagnosis Date Noted  . S/P left TKA 04/28/2017  . S/P total knee replacement 04/28/2017  . Situational anxiety 03/17/2016  . Low testosterone 03/13/2015  . Osteoarthritis, multiple sites 08/22/2013  . Obesity (BMI 30-39.9) 05/08/2013  . 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  . Liver nodule 10/07/2012  . History of kidney stones 08/27/2012  . 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 Rotator cuff tear, left 01/23/2011  . Hyperlipidemia 10/11/2010  . BURSITIS, LEFT HIP 05/20/2010  . Controlled type 2 diabetes mellitus without complication  (  [place]) 03/30/2009  . CHRONIC TENSION TYPE HEADACHE 03/30/2009  . ELEVATED BLOOD PRESSURE 03/30/2009  . SHOULDER PAIN, LEFT 10/20/2008  . CONTUSION OF KNEE 10/20/2008  . ABNORMAL FINDINGS GI TRACT 06/23/2008    Scot Jun, PTA 06/17/2017, 8:45 AM  [address] [place], [place], [address] Phone: [phone]   Fax:  [phone]  Name: [name] MRN: [id number] Date of Birth: Aug 22, 1960

## 2017-06-19 ENCOUNTER — Ambulatory Visit: Payer: BLUE CROSS/BLUE SHIELD | Admitting: Physical Therapy

## 2017-06-19 ENCOUNTER — Encounter: Payer: Self-pay | Admitting: Physical Therapy

## 2017-06-19 DIAGNOSIS — R262 Difficulty in walking, not elsewhere classified: Secondary | ICD-10-CM | POA: Diagnosis not present

## 2017-06-19 DIAGNOSIS — M25662 Stiffness of left knee, not elsewhere classified: Secondary | ICD-10-CM

## 2017-06-19 DIAGNOSIS — M25562 Pain in left knee: Secondary | ICD-10-CM

## 2017-06-19 DIAGNOSIS — R2242 Localized swelling, mass and lump, left lower limb: Secondary | ICD-10-CM

## 2017-06-19 NOTE — Therapy (Signed)
[address] [place], [place], [address] Phone: [phone]   Fax:  [phone]  Physical Therapy Treatment  Patient Details  Name: [name] MRN: [id number] Date of Birth: May 10, 1961 Referring Provider: Alvan Dame   Encounter Date: 06/19/2017  PT End of Session - 06/19/17 0832    Visit Number  22    Date for PT Re-Evaluation  07/01/17    PT Start Time  0751    PT Stop Time  0846    PT Time Calculation (min)  55 min    Activity Tolerance  Patient tolerated treatment well;Patient limited by pain    Behavior During Therapy  [facility] for tasks assessed/performed       Past Medical History:  Diagnosis Date  . Arthritis   . BURSITIS, LEFT HIP 05/20/2010  . Cancer [facility]) 2010   pancreatic cancer  . Chronic kidney disease    stones; 13 stones, currently has 2 stones   . Claustrophobia    "take Lorazepam if I have to fly"  . DIAB W/O COMP TYPE II/UNS NOT STATED UNCNTRL 03/30/2009  . ELEVATED BLOOD PRESSURE 03/30/2009  . Fissure, anal    occ bleeds still  . Headache(784.0)    "only when I was working; had headaches and migraines"  . Hyperlipidemia 10/11/2010  . SHOULDER PAIN, LEFT 10/20/2008    Past Surgical History:  Procedure Laterality Date  . ANAL FISSURE REPAIR  07/28/2011   Procedure: ANAL FISSURE REPAIR;  Surgeon: Adin Hector, MD;  Location: [facility];  Service: General;  Laterality: N/A;  Repair Anal Fissure/sphinterotomy and excision rectal polypl  . BACK SURGERY  2000   "had 2 cages put in"  . BIOPSY PANCREAS  2010   benign  . HERNIA REPAIR  7416   umbilical  . KIDNEY STONES  2010 & 2008 & 2005  . LAPAROSCOPIC INCISIONAL / UMBILICAL / VENTRAL HERNIA REPAIR  07/08/11   VHR  . left rotator cuff  02/26/2011  . left shoulder bone spurs  2011  . LIPOMA EXCISION     "several; off back, arms"  . pancreatic tumor  2010  . right rotator cuff     2011  . TONSILLECTOMY  1967  . TOTAL KNEE ARTHROPLASTY  Left 04/28/2017   Procedure: LEFT TOTAL KNEE ARTHROPLASTY;  Surgeon: Paralee Cancel, MD;  Location: [facility];  Service: Orthopedics;  Laterality: Left;  [facility] VASECTOMY  1994  . VENTRAL HERNIA REPAIR  07/08/2011   Procedure: LAPAROSCOPIC VENTRAL HERNIA;  Surgeon: Adin Hector, MD;  Location: [facility];  Service: General;  Laterality: N/A;  Laparoscopic repair ventral hernia with mesh, Lysis of adhesions.    There were no vitals filed for this visit.  Subjective Assessment - 06/19/17 0754    Subjective  "Knee is a problem today" Pt reports increase knee pain on the inside    Currently in Pain?  Yes    Pain Score  4     Pain Location  Knee    Pain Orientation  Left    Pain Descriptors / Indicators  Numbness;Sore                      [facility] Adult PT Treatment/Exercise - 06/19/17 0001      Knee/Hip Exercises: Aerobic   Elliptical  I 15 R8 1fd/2rev    Recumbent Bike  Seat 9 L3 x 567m     Nustep  x6 min L5 LE  only       Knee/Hip Exercises: Machines for Strengthening   Cybex Knee Flexion  35lb 2x15, LLE 20lb2x10      Cybex Leg Press  40lb 3x15       Knee/Hip Exercises: Standing   Heel Raises  2 seconds;Left;10 reps;3 sets    Hip Flexion  Left;2 sets;Knee bent;15 reps    Hip Flexion Limitations  2    Walking with Sports Cord  50lb 4 way x5 each      Knee/Hip Exercises: Seated   Long Arc Quad  Left;10 reps;2 sets    Long Arc Quad Weight  2 lbs.      Modalities   Modalities  Electrical Stimulation;Cryotherapy      Cryotherapy   Number Minutes Cryotherapy  15 Minutes    Cryotherapy Location  Knee    Type of Cryotherapy  Ice pack      Electrical Stimulation   Electrical Stimulation Location  L medial knee    Electrical Stimulation Action  IFC    Electrical Stimulation Parameters  Supine to pt tolerance    Electrical Stimulation Goals  Pain               PT Short Term Goals - 05/13/17 0842      PT SHORT TERM GOAL #1   Title  independent with initial HEP     Status  Achieved        PT Long Term Goals - 06/17/17 0835      PT LONG TERM GOAL #1   Title  decrease pain 50%    Status  Partially Met      PT LONG TERM GOAL #2   Title  walk without assistive device    Status  Achieved      PT LONG TERM GOAL #3   Title  increase AROM of the left knee to 5-115 degrees flexion      PT LONG TERM GOAL #4   Title  go up and down stairs step over step    Status  Partially Met            Plan - 06/19/17 0832    Clinical Impression Statement  Pt with increase pain in medial L knee. Therapy interventions backed down. Pt with a mild limp with resisted gait. Pt reports that it feels like the medial knee is being stretched. Pain improved as pt moved more.    Rehab Potential  Good    PT Frequency  3x / week    PT Duration  8 weeks    PT Treatment/Interventions  ADLs/Self Care Home Management;Cryotherapy;Electrical Stimulation;Gait training;Stair training;Functional mobility training;Patient/family education;Balance training;Therapeutic exercise;Therapeutic activities;Neuromuscular re-education;Manual techniques;Vasopneumatic Device    PT Next Visit Plan  progress as tolerated       Patient will benefit from skilled therapeutic intervention in order to improve the following deficits and impairments:  Abnormal gait, Cardiopulmonary status limiting activity, Decreased activity tolerance, Decreased balance, Decreased mobility, Decreased strength, Increased edema, Decreased scar mobility, Pain, Decreased range of motion, Difficulty walking  Visit Diagnosis: Localized swelling, mass and lump, left lower limb  Acute pain of left knee  Difficulty in walking, not elsewhere classified  Stiffness of left knee, not elsewhere classified     Problem List Patient Active Problem List   Diagnosis Date Noted  . S/P left TKA 04/28/2017  . S/P total knee replacement 04/28/2017  . Situational anxiety 03/17/2016  . Low testosterone 03/13/2015  .  Osteoarthritis, multiple sites 08/22/2013  .  Obesity (BMI 30-39.9) 05/08/2013  . 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  . Liver nodule 10/07/2012  . History of kidney stones 08/27/2012  . 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 Rotator cuff tear, left 01/23/2011  . Hyperlipidemia 10/11/2010  . BURSITIS, LEFT HIP 05/20/2010  . Controlled type 2 diabetes mellitus without complication ([place]) 42/90/3795  . CHRONIC TENSION TYPE HEADACHE 03/30/2009  . ELEVATED BLOOD PRESSURE 03/30/2009  . SHOULDER PAIN, LEFT 10/20/2008  . CONTUSION OF KNEE 10/20/2008  . ABNORMAL FINDINGS GI TRACT 06/23/2008    Scot Jun, PTA 06/19/2017, 8:35 AM  [address] [place], [place], [address] Phone: [phone]   Fax:  [phone]  Name: [name] MRN: [id number] Date of Birth: 05/15/1961

## 2017-06-22 ENCOUNTER — Encounter: Payer: Self-pay | Admitting: Physical Therapy

## 2017-06-22 ENCOUNTER — Ambulatory Visit: Payer: BLUE CROSS/BLUE SHIELD | Admitting: Physical Therapy

## 2017-06-22 DIAGNOSIS — M25562 Pain in left knee: Secondary | ICD-10-CM

## 2017-06-22 DIAGNOSIS — M25662 Stiffness of left knee, not elsewhere classified: Secondary | ICD-10-CM | POA: Diagnosis not present

## 2017-06-22 DIAGNOSIS — R2242 Localized swelling, mass and lump, left lower limb: Secondary | ICD-10-CM

## 2017-06-22 DIAGNOSIS — R262 Difficulty in walking, not elsewhere classified: Secondary | ICD-10-CM

## 2017-06-22 NOTE — Therapy (Signed)
[address] [place], [place], [address] Phone: [phone]   Fax:  [phone]  Physical Therapy Treatment  Patient Details  Name: [name] MRN: [id number] Date of Birth: 1960/11/01 Referring Provider: Alvan Dame   Encounter Date: 06/22/2017  PT End of Session - 06/22/17 0844    Visit Number  23    Date for PT Re-Evaluation  07/01/17    PT Start Time  0751    PT Stop Time  0859    PT Time Calculation (min)  68 min    Activity Tolerance  Patient tolerated treatment well    Behavior During Therapy  [facility] for tasks assessed/performed       Past Medical History:  Diagnosis Date  . Arthritis   . BURSITIS, LEFT HIP 05/20/2010  . Cancer [facility]) 2010   pancreatic cancer  . Chronic kidney disease    stones; 13 stones, currently has 2 stones   . Claustrophobia    "take Lorazepam if I have to fly"  . DIAB W/O COMP TYPE II/UNS NOT STATED UNCNTRL 03/30/2009  . ELEVATED BLOOD PRESSURE 03/30/2009  . Fissure, anal    occ bleeds still  . Headache(784.0)    "only when I was working; had headaches and migraines"  . Hyperlipidemia 10/11/2010  . SHOULDER PAIN, LEFT 10/20/2008    Past Surgical History:  Procedure Laterality Date  . ANAL FISSURE REPAIR N/A 07/28/2011   Performed by Adin Hector, MD at [facility] ORS  . BACK SURGERY  2000   "had 2 cages put in"  . BIOPSY PANCREAS  2010   benign  . HERNIA REPAIR  8502   umbilical  . KIDNEY STONES  2010 & 2008 & 2005  . LAPAROSCOPIC INCISIONAL / UMBILICAL / VENTRAL HERNIA REPAIR  07/08/11   VHR  . LAPAROSCOPIC VENTRAL HERNIA N/A 07/08/2011   Performed by Adin Hector, MD at [place] [place]  . left rotator cuff  02/26/2011  . left shoulder bone spurs  2011  . LEFT TOTAL KNEE ARTHROPLASTY Left 04/28/2017   Performed by Paralee Cancel, MD at [facility] ORS  . LIPOMA EXCISION     "several; off back, arms"  . pancreatic tumor  2010  . right rotator cuff     2011  . TONSILLECTOMY  1967   . VASECTOMY  1994    There were no vitals filed for this visit.  Subjective Assessment - 06/22/17 0758    Subjective  "Can we get that same treatment that we got Friday, it seemed to help a lot"    Currently in Pain?  Yes    Pain Score  3     Pain Orientation  Left         [facility] PT Assessment - 06/22/17 0001      AROM   AROM Assessment Site  Knee    Right/Left Knee  Left    Left Knee Extension  0    Left Knee Flexion  125                  [facility] Adult PT Treatment/Exercise - 06/22/17 0001      Ambulation/Gait   Stairs  Yes    Stairs Assistance  6: Modified independent (Device/Increase time);5: Supervision    Stair Management Technique  One rail Right;Alternating pattern    Number of Stairs  24    Height of Stairs  6    Gait Comments  x3, LLE  weakness with eccentric lowering      Knee/Hip Exercises: Aerobic   Elliptical  I 15 R8 46fd/2rev    Recumbent Bike  Seat 9 L4 x 558m     Nustep  x6 min L5 LE only       Knee/Hip Exercises: Machines for Strengthening   Cybex Knee Extension  LLE eccentrics 5lb 2x10; LLE 5lb 2x5     Cybex Knee Flexion  45lb 2x15, LLE 25lb2x10      Cybex Leg Press  40lb 3x15       Knee/Hip Exercises: Standing   Heel Raises  2 seconds;Left;10 reps;3 sets    Hip Flexion  Left;Knee bent;15 reps;1 set;2 sets then TKE    Hip Flexion Limitations  2    Hip Abduction  Both;1 set;15 reps;Knee straight    Abduction Limitations  3    Hip Extension  Both;1 set;15 reps;Knee straight    Extension Limitations  3      Knee/Hip Exercises: Seated   Long Arc Quad  Left;10 reps;2 sets    Long Arc Quad Weight  3 lbs.      ElAcupuncturistocation  L knee    Electrical Stimulation Action  IFC    Electrical Stimulation Parameters  supine, to pt tolerance    Electrical Stimulation Goals  Pain      Vasopneumatic   Number Minutes Vasopneumatic   15 minutes    Vasopnuematic Location   Knee    Vasopneumatic Pressure   Medium    Vasopneumatic Temperature   33               PT Short Term Goals - 05/13/17 080630    PT SHORT TERM GOAL #1   Title  independent with initial HEP    Status  Achieved        PT Long Term Goals - 06/22/17 0847      PT LONG TERM GOAL #2   Status  Achieved      PT LONG TERM GOAL #3   Title  increase AROM of the left knee to 5-115 degrees flexion    Status  Achieved      PT LONG TERM GOAL #4   Title  go up and down stairs step over step    Status  Partially Met            Plan - 06/22/17 0845    Clinical Impression Statement  Pt has increased his AROM, he does demos some LLE weakness when going does stairs. Pt also reports some pain when going down steps. Pt with some difficulty with standing hip flex then ext knee.    Rehab Potential  Good    PT Frequency  3x / week    PT Duration  8 weeks    PT Treatment/Interventions  ADLs/Self Care Home Management;Cryotherapy;Electrical Stimulation;Gait training;Stair training;Functional mobility training;Patient/family education;Balance training;Therapeutic exercise;Therapeutic activities;Neuromuscular re-education;Manual techniques;Vasopneumatic Device    PT Next Visit Plan  progress as tolerated       Patient will benefit from skilled therapeutic intervention in order to improve the following deficits and impairments:  Abnormal gait, Cardiopulmonary status limiting activity, Decreased activity tolerance, Decreased balance, Decreased mobility, Decreased strength, Increased edema, Decreased scar mobility, Pain, Decreased range of motion, Difficulty walking  Visit Diagnosis: Localized swelling, mass and lump, left lower limb  Acute pain of left knee  Difficulty in walking, not elsewhere classified  Stiffness of left knee, not elsewhere classified  Problem List Patient Active Problem List   Diagnosis Date Noted  . S/P left TKA 04/28/2017  . S/P total knee replacement 04/28/2017  . Situational anxiety  03/17/2016  . Low testosterone 03/13/2015  . Osteoarthritis, multiple sites 08/22/2013  . Obesity (BMI 30-39.9) 05/08/2013  . 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  . Liver nodule 10/07/2012  . History of kidney stones 08/27/2012  . 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 Rotator cuff tear, left 01/23/2011  . Hyperlipidemia 10/11/2010  . BURSITIS, LEFT HIP 05/20/2010  . Controlled type 2 diabetes mellitus without complication ([place]) 15/40/0867  . CHRONIC TENSION TYPE HEADACHE 03/30/2009  . ELEVATED BLOOD PRESSURE 03/30/2009  . SHOULDER PAIN, LEFT 10/20/2008  . CONTUSION OF KNEE 10/20/2008  . ABNORMAL FINDINGS GI TRACT 06/23/2008    Scot Jun, PTA 06/22/2017, 8:47 AM  [address] [place], [place], [address] Phone: [phone]   Fax:  [phone]  Name: DICKY BOER MRN: [id number] Date of Birth: September 20, 1960

## 2017-06-24 ENCOUNTER — Ambulatory Visit: Payer: BLUE CROSS/BLUE SHIELD | Admitting: Physical Therapy

## 2017-06-24 ENCOUNTER — Encounter: Payer: Self-pay | Admitting: Physical Therapy

## 2017-06-24 DIAGNOSIS — R262 Difficulty in walking, not elsewhere classified: Secondary | ICD-10-CM

## 2017-06-24 DIAGNOSIS — R2242 Localized swelling, mass and lump, left lower limb: Secondary | ICD-10-CM

## 2017-06-24 DIAGNOSIS — M25662 Stiffness of left knee, not elsewhere classified: Secondary | ICD-10-CM | POA: Diagnosis not present

## 2017-06-24 DIAGNOSIS — M25562 Pain in left knee: Secondary | ICD-10-CM

## 2017-06-24 NOTE — Therapy (Signed)
[address] [place], [place], [address] Phone: [phone]   Fax:  [phone]  Physical Therapy Treatment  Patient Details  Name: [name] MRN: [id number] Date of Birth: 06/05/1961 Referring Provider: Alvan Dame   Encounter Date: 06/24/2017  PT End of Session - 06/24/17 0840    Visit Number  24    Date for PT Re-Evaluation  07/01/17    PT Start Time  0751    PT Stop Time  0854    PT Time Calculation (min)  63 min    Activity Tolerance  Patient tolerated treatment well    Behavior During Therapy  [facility] for tasks assessed/performed       Past Medical History:  Diagnosis Date  . Arthritis   . BURSITIS, LEFT HIP 05/20/2010  . Cancer [facility]) 2010   pancreatic cancer  . Chronic kidney disease    stones; 13 stones, currently has 2 stones   . Claustrophobia    "take Lorazepam if I have to fly"  . DIAB W/O COMP TYPE II/UNS NOT STATED UNCNTRL 03/30/2009  . ELEVATED BLOOD PRESSURE 03/30/2009  . Fissure, anal    occ bleeds still  . Headache(784.0)    "only when I was working; had headaches and migraines"  . Hyperlipidemia 10/11/2010  . SHOULDER PAIN, LEFT 10/20/2008    Past Surgical History:  Procedure Laterality Date  . ANAL FISSURE REPAIR  07/28/2011   Procedure: ANAL FISSURE REPAIR;  Surgeon: Adin Hector, MD;  Location: [facility] ORS;  Service: General;  Laterality: N/A;  Repair Anal Fissure/sphinterotomy and excision rectal polypl  . BACK SURGERY  2000   "had 2 cages put in"  . BIOPSY PANCREAS  2010   benign  . HERNIA REPAIR  0737   umbilical  . KIDNEY STONES  2010 & 2008 & 2005  . LAPAROSCOPIC INCISIONAL / UMBILICAL / VENTRAL HERNIA REPAIR  07/08/11   VHR  . left rotator cuff  02/26/2011  . left shoulder bone spurs  2011  . LIPOMA EXCISION     "several; off back, arms"  . pancreatic tumor  2010  . right rotator cuff     2011  . TONSILLECTOMY  1967  . TOTAL KNEE ARTHROPLASTY Left 04/28/2017   Procedure: LEFT TOTAL KNEE ARTHROPLASTY;  Surgeon: Paralee Cancel, MD;  Location: [facility] ORS;  Service: Orthopedics;  Laterality: Left;  Marland Kitchen VASECTOMY  1994  . VENTRAL HERNIA REPAIR  07/08/2011   Procedure: LAPAROSCOPIC VENTRAL HERNIA;  Surgeon: Adin Hector, MD;  Location: [facility];  Service: General;  Laterality: N/A;  Laparoscopic repair ventral hernia with mesh, Lysis of adhesions.    There were no vitals filed for this visit.  Subjective Assessment - 06/24/17 0752    Subjective  "the knee is weird feeling today, it is not sore, just feels strange"    Currently in Pain?  Yes    Pain Score  2     Pain Location  Knee                      [facility] Adult PT Treatment/Exercise - 06/24/17 0001      Knee/Hip Exercises: Aerobic   Elliptical  I 15 R10 30fd/2rev    Recumbent Bike  Seat 9 L4 x 515m     Nustep  x6 min L5 LE only       Knee/Hip Exercises: Machines for Strengthening   Cybex Knee Extension  LLE eccentrics  5lb 2x10; LLE 5lb 2x10     Cybex Knee Flexion  45lb 2x15, LLE 25lb2x10      Cybex Leg Press  60lb 3x10; LLE 30lb 3x5       Knee/Hip Exercises: Standing   Heel Raises  2 seconds;Left;10 reps;3 sets    Hip Abduction  Both;1 set;15 reps;Knee straight    Abduction Limitations  3    Hip Extension  Both;1 set;15 reps;Knee straight    Extension Limitations  3    Lateral Step Up  Left;2 sets;10 reps;Hand Hold: 0;Step Height: 8"    Forward Step Up  Left;10 reps;1 set;Step Height: 8";Hand Hold: 0;2 sets    Walking with Sports Cord  50lb 4 way x3 each      Modalities   Modalities  Electrical Stimulation;Cryotherapy      Cryotherapy   Number Minutes Cryotherapy  15 Minutes    Cryotherapy Location  Knee    Type of Cryotherapy  Ice pack      Electrical Stimulation   Electrical Stimulation Location  L knee    Electrical Stimulation Action  IFC    Electrical Stimulation Parameters  supine to pt tolerance    Electrical Stimulation Goals  Pain      Vasopneumatic   Number  Minutes Vasopneumatic   15 minutes    Vasopnuematic Location   Knee    Vasopneumatic Pressure  Medium    Vasopneumatic Temperature   33               PT Short Term Goals - 05/13/17 2505      PT SHORT TERM GOAL #1   Title  independent with initial HEP    Status  Achieved        PT Long Term Goals - 06/22/17 0847      PT LONG TERM GOAL #2   Status  Achieved      PT LONG TERM GOAL #3   Title  increase AROM of the left knee to 5-115 degrees flexion    Status  Achieved      PT LONG TERM GOAL #4   Title  go up and down stairs step over step    Status  Partially Met            Plan - 06/24/17 0840    Clinical Impression Statement   No issues with today's exercises, Pt does show some weakness with resisted backwards walking. Pt fatigues quick with step up interventions.    Rehab Potential  Good    PT Frequency  3x / week    PT Duration  8 weeks    PT Treatment/Interventions  ADLs/Self Care Home Management;Cryotherapy;Electrical Stimulation;Gait training;Stair training;Functional mobility training;Patient/family education;Balance training;Therapeutic exercise;Therapeutic activities;Neuromuscular re-education;Manual techniques;Vasopneumatic Device    PT Next Visit Plan  progress as tolerated       Patient will benefit from skilled therapeutic intervention in order to improve the following deficits and impairments:  Abnormal gait, Cardiopulmonary status limiting activity, Decreased activity tolerance, Decreased balance, Decreased mobility, Decreased strength, Increased edema, Decreased scar mobility, Pain, Decreased range of motion, Difficulty walking  Visit Diagnosis: Acute pain of left knee  Difficulty in walking, not elsewhere classified  Stiffness of left knee, not elsewhere classified  Localized swelling, mass and lump, left lower limb     Problem List Patient Active Problem List   Diagnosis Date Noted  . S/P left TKA 04/28/2017  . S/P total knee  replacement 04/28/2017  . Situational anxiety 03/17/2016  .  Low testosterone 03/13/2015  . Osteoarthritis, multiple sites 08/22/2013  . Obesity (BMI 30-39.9) 05/08/2013  . 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  . Liver nodule 10/07/2012  . History of kidney stones 08/27/2012  . 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 Rotator cuff tear, left 01/23/2011  . Hyperlipidemia 10/11/2010  . BURSITIS, LEFT HIP 05/20/2010  . Controlled type 2 diabetes mellitus without complication ([place]) 17/35/6701  . CHRONIC TENSION TYPE HEADACHE 03/30/2009  . ELEVATED BLOOD PRESSURE 03/30/2009  . SHOULDER PAIN, LEFT 10/20/2008  . CONTUSION OF KNEE 10/20/2008  . ABNORMAL FINDINGS GI TRACT 06/23/2008    Scot Jun, PTA 06/24/2017, 8:43 AM  [address] [place], [place], [address] Phone: [phone]   Fax:  [phone]  Name: [name] MRN: [id number] Date of Birth: May 30, 1961

## 2017-06-26 ENCOUNTER — Other Ambulatory Visit: Payer: Self-pay | Admitting: Family Medicine

## 2017-06-29 ENCOUNTER — Ambulatory Visit: Payer: BLUE CROSS/BLUE SHIELD | Admitting: Physical Therapy

## 2017-06-29 ENCOUNTER — Encounter: Payer: Self-pay | Admitting: Physical Therapy

## 2017-06-29 DIAGNOSIS — R262 Difficulty in walking, not elsewhere classified: Secondary | ICD-10-CM | POA: Diagnosis not present

## 2017-06-29 DIAGNOSIS — M25562 Pain in left knee: Secondary | ICD-10-CM

## 2017-06-29 DIAGNOSIS — R2242 Localized swelling, mass and lump, left lower limb: Secondary | ICD-10-CM | POA: Diagnosis not present

## 2017-06-29 DIAGNOSIS — M25662 Stiffness of left knee, not elsewhere classified: Secondary | ICD-10-CM

## 2017-06-29 NOTE — Therapy (Signed)
[address] [place], [place], [address] Phone: [phone]   Fax:  [phone]  Physical Therapy Treatment  Patient Details  Name: [name] MRN: [id number] Date of Birth: 06-04-1961 Referring Provider: Alvan Dame   Encounter Date: 06/29/2017  PT End of Session - 06/29/17 0838    Visit Number  25    Date for PT Re-Evaluation  07/03/17    PT Start Time  0748    PT Stop Time  0849    PT Time Calculation (min)  61 min    Activity Tolerance  Patient tolerated treatment well    Behavior During Therapy  [facility] for tasks assessed/performed       Past Medical History:  Diagnosis Date  . Arthritis   . BURSITIS, LEFT HIP 05/20/2010  . Cancer [name]) 2010   pancreatic cancer  . Chronic kidney disease    stones; 13 stones, currently has 2 stones   . Claustrophobia    "take Lorazepam if I have to fly"  . DIAB W/O COMP TYPE II/UNS NOT STATED UNCNTRL 03/30/2009  . ELEVATED BLOOD PRESSURE 03/30/2009  . Fissure, anal    occ bleeds still  . Headache(784.0)    "only when I was working; had headaches and migraines"  . Hyperlipidemia 10/11/2010  . SHOULDER PAIN, LEFT 10/20/2008    Past Surgical History:  Procedure Laterality Date  . ANAL FISSURE REPAIR  07/28/2011   Procedure: ANAL FISSURE REPAIR;  Surgeon: Adin Hector, MD;  Location: [facility]S;  Service: General;  Laterality: N/A;  Repair Anal Fissure/sphinterotomy and excision rectal polypl  . BACK SURGERY  2000   "had 2 cages put in"  . BIOPSY PANCREAS  2010   benign  . HERNIA REPAIR  5537   umbilical  . KIDNEY STONES  2010 & 2008 & 2005  . LAPAROSCOPIC INCISIONAL / UMBILICAL / VENTRAL HERNIA REPAIR  07/08/11   VHR  . left rotator cuff  02/26/2011  . left shoulder bone spurs  2011  . LIPOMA EXCISION     "several; off back, arms"  . pancreatic tumor  2010  . right rotator cuff     2011  . TONSILLECTOMY  1967  . TOTAL KNEE ARTHROPLASTY Left 04/28/2017    Procedure: LEFT TOTAL KNEE ARTHROPLASTY;  Surgeon: Paralee Cancel, MD;  Location: [facility];  Service: Orthopedics;  Laterality: Left;  Marland Kitchen VASECTOMY  1994  . VENTRAL HERNIA REPAIR  07/08/2011   Procedure: LAPAROSCOPIC VENTRAL HERNIA;  Surgeon: Adin Hector, MD;  Location: [place];  Service: General;  Laterality: N/A;  Laparoscopic repair ventral hernia with mesh, Lysis of adhesions.    There were no vitals filed for this visit.  Subjective Assessment - 06/29/17 0753    Subjective  Reports that he is doing well, [facility] and reports that he did a few things, has some questions about machines.    Currently in Pain?  Yes    Pain Score  2     Pain Location  Knee    Pain Orientation  Left                      [facility] Adult PT Treatment/Exercise - 06/29/17 0001      Self-Care   Self-Care  Other Self-Care Comments    Other Self-Care Comments   started going over gym safety and use of equipment, educated on the continued  progression of the TKR, as well as how some balance activities may be beneficial      Knee/Hip Exercises: Stretches   Sports administrator  3 reps;20 seconds    Hip Flexor Stretch  3 reps;20 seconds    Gastroc Stretch  3 reps;20 seconds      Knee/Hip Exercises: Aerobic   Elliptical  I 15 R10 20fd/2rev    Recumbent Bike  Seat 9 L5 x 527m     Nustep  x6 min L5 LE only       Knee/Hip Exercises: Machines for Strengthening   Cybex Knee Extension  LLE eccentrics 10lb 2x10; LLE 10lb 2x10     Cybex Knee Flexion  45lb 2x15, LLE 25lb2x10      Cybex Leg Press  60lb 3x10; LLE 30lb 3x5       Electrical Stimulation   Electrical Stimulation Location  L knee    Electrical Stimulation Action  IFC    Electrical Stimulation Parameters  sipine in elevation    Electrical Stimulation Goals  Pain      Vasopneumatic   Number Minutes Vasopneumatic   15 minutes    Vasopnuematic Location   Knee    Vasopneumatic Pressure  Medium    Vasopneumatic Temperature   33                PT Short Term Goals - 05/13/17 083295    PT SHORT TERM GOAL #1   Title  independent with initial HEP    Status  Achieved        PT Long Term Goals - 06/22/17 0847      PT LONG TERM GOAL #2   Status  Achieved      PT LONG TERM GOAL #3   Title  increase AROM of the left knee to 5-115 degrees flexion    Status  Achieved      PT LONG TERM GOAL #4   Title  go up and down stairs step over step    Status  Partially Met            Plan - 06/29/17 0839    Clinical Impression Statement  Patient with some crepitus with some exercises, has tightness in the quads and the calves.    PT Next Visit Plan  continue to educate about his recovery on his own, prepare for D/C at end of week    Consulted and Agree with Plan of Care  Patient       Patient will benefit from skilled therapeutic intervention in order to improve the following deficits and impairments:  Abnormal gait, Cardiopulmonary status limiting activity, Decreased activity tolerance, Decreased balance, Decreased mobility, Decreased strength, Increased edema, Decreased scar mobility, Pain, Decreased range of motion, Difficulty walking  Visit Diagnosis: Acute pain of left knee  Difficulty in walking, not elsewhere classified  Stiffness of left knee, not elsewhere classified  Localized swelling, mass and lump, left lower limb     Problem List Patient Active Problem List   Diagnosis Date Noted  . S/P left TKA 04/28/2017  . S/P total knee replacement 04/28/2017  . Situational anxiety 03/17/2016  . Low testosterone 03/13/2015  . Osteoarthritis, multiple sites 08/22/2013  . Obesity (BMI 30-39.9) 05/08/2013  . 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  . Liver nodule 10/07/2012  . History of kidney stones 08/27/2012  . 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 Rotator cuff tear, left 01/23/2011  . Hyperlipidemia 10/11/2010  . BURSITIS, LEFT HIP 05/20/2010  .  Controlled type 2 diabetes  mellitus without complication ([place]) 94/85/4627  . CHRONIC TENSION TYPE HEADACHE 03/30/2009  . ELEVATED BLOOD PRESSURE 03/30/2009  . SHOULDER PAIN, LEFT 10/20/2008  . CONTUSION OF KNEE 10/20/2008  . ABNORMAL FINDINGS GI TRACT 06/23/2008    Sumner Boast., PT 06/29/2017, 8:40 AM  [address] [place], [place], [address] Phone: [phone]   Fax:  [phone]  Name: BRANDELL MAREADY MRN: [id number] Date of Birth: [birth year]

## 2017-07-01 ENCOUNTER — Encounter: Payer: Self-pay | Admitting: Physical Therapy

## 2017-07-01 ENCOUNTER — Ambulatory Visit: Payer: BLUE CROSS/BLUE SHIELD | Admitting: Physical Therapy

## 2017-07-01 ENCOUNTER — Ambulatory Visit: Payer: Self-pay | Admitting: Family Medicine

## 2017-07-01 DIAGNOSIS — R2242 Localized swelling, mass and lump, left lower limb: Secondary | ICD-10-CM

## 2017-07-01 DIAGNOSIS — R262 Difficulty in walking, not elsewhere classified: Secondary | ICD-10-CM

## 2017-07-01 DIAGNOSIS — M25562 Pain in left knee: Secondary | ICD-10-CM | POA: Diagnosis not present

## 2017-07-01 DIAGNOSIS — M25662 Stiffness of left knee, not elsewhere classified: Secondary | ICD-10-CM

## 2017-07-01 NOTE — Therapy (Signed)
[address] [place], [place], [address] Phone: [phone]   Fax:  [phone]  Physical Therapy Treatment  Patient Details  Name: [name] MRN: [id number] Date of Birth: 05-22-1961 Referring Provider: Alvan Dame   Encounter Date: 07/01/2017  PT End of Session - 07/01/17 0842    Visit Number  5    PT Start Time  0751    PT Stop Time  0856    PT Time Calculation (min)  65 min    Activity Tolerance  Patient tolerated treatment well    Behavior During Therapy  [facility] for tasks assessed/performed       Past Medical History:  Diagnosis Date  . Arthritis   . BURSITIS, LEFT HIP 05/20/2010  . Cancer [facility]) 2010   pancreatic cancer  . Chronic kidney disease    stones; 13 stones, currently has 2 stones   . Claustrophobia    "take Lorazepam if I have to fly"  . DIAB W/O COMP TYPE II/UNS NOT STATED UNCNTRL 03/30/2009  . ELEVATED BLOOD PRESSURE 03/30/2009  . Fissure, anal    occ bleeds still  . Headache(784.0)    "only when I was working; had headaches and migraines"  . Hyperlipidemia 10/11/2010  . SHOULDER PAIN, LEFT 10/20/2008    Past Surgical History:  Procedure Laterality Date  . ANAL FISSURE REPAIR  07/28/2011   Procedure: ANAL FISSURE REPAIR;  Surgeon: Adin Hector, MD;  [facility];  Service: General;  Laterality: N/A;  Repair Anal Fissure/sphinterotomy and excision rectal polypl  . BACK SURGERY  2000   "had 2 cages put in"  . BIOPSY PANCREAS  2010   benign  . HERNIA REPAIR  0240   umbilical  . KIDNEY STONES  2010 & 2008 & 2005  . LAPAROSCOPIC INCISIONAL / UMBILICAL / VENTRAL HERNIA REPAIR  07/08/11   VHR  . left rotator cuff  02/26/2011  . left shoulder bone spurs  2011  . LIPOMA EXCISION     "several; off back, arms"  . pancreatic tumor  2010  . right rotator cuff     2011  . TONSILLECTOMY  1967  . TOTAL KNEE ARTHROPLASTY Left 04/28/2017   Procedure: LEFT TOTAL KNEE ARTHROPLASTY;   Surgeon: Paralee Cancel, MD;  [facility];  Service: Orthopedics;  Laterality: Left;  Marland Kitchen VASECTOMY  1994  . VENTRAL HERNIA REPAIR  07/08/2011   Procedure: LAPAROSCOPIC VENTRAL HERNIA;  Surgeon: Adin Hector, MD;  Location: [facility];  Service: General;  Laterality: N/A;  Laparoscopic repair ventral hernia with mesh, Lysis of adhesions.    There were no vitals filed for this visit.  Subjective Assessment - 07/01/17 0755    Subjective  "Not to bad"    Currently in Pain?  No/denies    Pain Score  2     Pain Location  Back    Pain Orientation  Left                      [facility] Adult PT Treatment/Exercise - 07/01/17 0001      Ambulation/Gait   Stairs  Yes    Stairs Assistance  7: Independent;6: Modified independent (Device/Increase time)    Stair Management Technique  One rail Right;Alternating pattern;No rails    Number of Stairs  24    Height of Stairs  6    Gait Comments  x2, LLE weakness with eccentric lowering  Knee/Hip Exercises: Aerobic   Elliptical  I 15 R10 99fd/2rev    Recumbent Bike  Seat 9 L4 x 555m     Nustep  x6 min L5 LE only       Knee/Hip Exercises: Machines for Strengthening   Cybex Knee Extension  LLE 10lb 2x10; 15lb LLE eccentrics 3x5    Cybex Knee Flexion  45lb 2x15, LLE 25lb2x10      Cybex Leg Press  60lb 3x10; LLE 40lb 3x5       Knee/Hip Exercises: Standing   Lateral Step Up  Left;2 sets;10 reps;Hand Hold: 0;Step Height: 8"    Forward Step Up  Left;10 reps;Step Height: 8";Hand Hold: 0;2 sets explosive, holding 3lb     Other Standing Knee Exercises  SL DL 4lb x10      Knee/Hip Exercises: Seated   Sit to Sand  2 sets;10 reps;without UE support holding yellow ball       Electrical Stimulation   Electrical Stimulation Location  L knee    Electrical Stimulation Action  IFC    Electrical Stimulation Parameters  supine    Electrical Stimulation Goals  Pain      Vasopneumatic   Number Minutes Vasopneumatic   15 minutes    Vasopnuematic  Location   Knee    Vasopneumatic Pressure  Medium    Vasopneumatic Temperature   33               PT Short Term Goals - 05/13/17 084944    PT SHORT TERM GOAL #1   Title  independent with initial HEP    Status  Achieved        PT Long Term Goals - 06/22/17 0847      PT LONG TERM GOAL #2   Status  Achieved      PT LONG TERM GOAL #3   Title  increase AROM of the left knee to 5-115 degrees flexion    Status  Achieved      PT LONG TERM GOAL #4   Title  go up and down stairs step over step    Status  Partially Met            Plan - 07/01/17 0843    Clinical Impression Statement  pain and weakness in LLE when controlling stair descents. Mild instability with SL DL    Rehab Potential  Good    PT Frequency  3x / week    PT Duration  8 weeks    PT Treatment/Interventions  ADLs/Self Care Home Management;Cryotherapy;Electrical Stimulation;Gait training;Stair training;Functional mobility training;Patient/family education;Balance training;Therapeutic exercise;Therapeutic activities;Neuromuscular re-education;Manual techniques;Vasopneumatic Device    PT Next Visit Plan  continue to educate about his recovery on his own, prepare for D/C at end of week       Patient will benefit from skilled therapeutic intervention in order to improve the following deficits and impairments:  Abnormal gait, Cardiopulmonary status limiting activity, Decreased activity tolerance, Decreased balance, Decreased mobility, Decreased strength, Increased edema, Decreased scar mobility, Pain, Decreased range of motion, Difficulty walking  Visit Diagnosis: Acute pain of left knee  Difficulty in walking, not elsewhere classified  Stiffness of left knee, not elsewhere classified  Localized swelling, mass and lump, left lower limb     Problem List Patient Active Problem List   Diagnosis Date Noted  . S/P left TKA 04/28/2017  . S/P total knee replacement 04/28/2017  . Situational anxiety  03/17/2016  . Low testosterone 03/13/2015  . Osteoarthritis, multiple sites  08/22/2013  . Obesity (BMI 30-39.9) 05/08/2013  . 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  . Liver nodule 10/07/2012  . History of kidney stones 08/27/2012  . 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 Rotator cuff tear, left 01/23/2011  . Hyperlipidemia 10/11/2010  . BURSITIS, LEFT HIP 05/20/2010  . Controlled type 2 diabetes mellitus without complication ([place]) 51/02/1251  . CHRONIC TENSION TYPE HEADACHE 03/30/2009  . ELEVATED BLOOD PRESSURE 03/30/2009  . SHOULDER PAIN, LEFT 10/20/2008  . CONTUSION OF KNEE 10/20/2008  . ABNORMAL FINDINGS GI TRACT 06/23/2008    Scot Jun, PTA 07/01/2017, 8:45 AM  [address] [place], [place], [address] Phone: [phone]   Fax:  [phone]  Name: [name] MRN: [id number] Date of Birth: Dec 25, 1960

## 2017-07-03 ENCOUNTER — Ambulatory Visit: Payer: BLUE CROSS/BLUE SHIELD | Admitting: Physical Therapy

## 2017-07-03 ENCOUNTER — Encounter: Payer: Self-pay | Admitting: Physical Therapy

## 2017-07-03 DIAGNOSIS — R262 Difficulty in walking, not elsewhere classified: Secondary | ICD-10-CM | POA: Diagnosis not present

## 2017-07-03 DIAGNOSIS — R2242 Localized swelling, mass and lump, left lower limb: Secondary | ICD-10-CM | POA: Diagnosis not present

## 2017-07-03 DIAGNOSIS — M25662 Stiffness of left knee, not elsewhere classified: Secondary | ICD-10-CM | POA: Diagnosis not present

## 2017-07-03 DIAGNOSIS — M25562 Pain in left knee: Secondary | ICD-10-CM

## 2017-07-03 NOTE — Therapy (Addendum)
[address] [place], [place], [address] Phone: [phone]   Fax:  [phone]  Physical Therapy Treatment  Patient Details  Name: [name] MRN: [id number] Date of Birth: 07-22-1961 Referring Provider: Alvan Dame   Encounter Date: 07/03/2017  PT End of Session - 07/03/17 0843    Visit Number  27    Date for PT Re-Evaluation  07/03/17    PT Start Time  0751    PT Stop Time  0855    PT Time Calculation (min)  64 min    Activity Tolerance  Patient tolerated treatment well    Behavior During Therapy  [facility] for tasks assessed/performed       Past Medical History:  Diagnosis Date  . Arthritis   . BURSITIS, LEFT HIP 05/20/2010  . Cancer [facility]) 2010   pancreatic cancer  . Chronic kidney disease    stones; 13 stones, currently has 2 stones   . Claustrophobia    "take Lorazepam if I have to fly"  . DIAB W/O COMP TYPE II/UNS NOT STATED UNCNTRL 03/30/2009  . ELEVATED BLOOD PRESSURE 03/30/2009  . Fissure, anal    occ bleeds still  . Headache(784.0)    "only when I was working; had headaches and migraines"  . Hyperlipidemia 10/11/2010  . SHOULDER PAIN, LEFT 10/20/2008    Past Surgical History:  Procedure Laterality Date  . ANAL FISSURE REPAIR  07/28/2011   Procedure: ANAL FISSURE REPAIR;  Surgeon: Adin Hector, MD;  [facility];  Service: General;  Laterality: N/A;  Repair Anal Fissure/sphinterotomy and excision rectal polypl  . BACK SURGERY  2000   "had 2 cages put in"  . BIOPSY PANCREAS  2010   benign  . HERNIA REPAIR  3976   umbilical  . KIDNEY STONES  2010 & 2008 & 2005  . LAPAROSCOPIC INCISIONAL / UMBILICAL / VENTRAL HERNIA REPAIR  07/08/11   VHR  . left rotator cuff  02/26/2011  . left shoulder bone spurs  2011  . LIPOMA EXCISION     "several; off back, arms"  . pancreatic tumor  2010  . right rotator cuff     2011  . TONSILLECTOMY  1967  . TOTAL KNEE ARTHROPLASTY Left 04/28/2017   Procedure: LEFT TOTAL KNEE ARTHROPLASTY;  Surgeon: Paralee Cancel, MD;  [facility];  Service: Orthopedics;  Laterality: Left;  Marland Kitchen VASECTOMY  1994  . VENTRAL HERNIA REPAIR  07/08/2011   Procedure: LAPAROSCOPIC VENTRAL HERNIA;  Surgeon: Adin Hector, MD;  Location: [facility];  Service: General;  Laterality: N/A;  Laparoscopic repair ventral hernia with mesh, Lysis of adhesions.    There were no vitals filed for this visit.  Subjective Assessment - 07/03/17 0757    Subjective  "not bad"    Currently in Pain?  No/denies    Pain Score  0-No pain         [facility] PT Assessment - 07/03/17 0001      AROM   AROM Assessment Site  Knee    Right/Left Knee  Left    Left Knee Extension  0    Left Knee Flexion  125                  [facility] Adult PT Treatment/Exercise - 07/03/17 0001      Ambulation/Gait   Stairs  Yes    Stairs Assistance  7: Independent    Stair Management Technique  No rails;One rail  Right;Alternating pattern    Number of Stairs  24    Height of Stairs  6    Gait Comments  x2      Knee/Hip Exercises: Aerobic   Elliptical  I 15 R10 5fd/2rev    Recumbent Bike  Seat 9 L4 x 557m     Nustep  x6 min L5 LE only       Knee/Hip Exercises: Machines for Strengthening   Cybex Knee Extension  15lb 2x15, LLE 5lb 3x5    Cybex Knee Flexion  55lb 2x15     Cybex Leg Press  60lb 3x10; LLE 40lb 3x5       Knee/Hip Exercises: Standing   Heel Raises  Both;2 sets;15 reps;2 seconds    Lateral Step Up  Left;2 sets;10 reps;Hand Hold: 0;Step Height: 8"    Forward Step Up  Left;10 reps;Step Height: 8";Hand Hold: 0;2 sets explosive holdinf 3lb    Walking with Sports Cord  50lb 4 way x5 each      Knee/Hip Exercises: Seated   Sit to Sand  2 sets;10 reps;without UE support holding weighted ball       Electrical Stimulation   Electrical Stimulation Location  L knee    Electrical Stimulation Action  IFC    Electrical Stimulation Parameters  supine    Electrical Stimulation Goals   Pain      Vasopneumatic   Number Minutes Vasopneumatic   15 minutes    Vasopnuematic Location   Knee    Vasopneumatic Pressure  Medium    Vasopneumatic Temperature   33               PT Short Term Goals - 05/13/17 0842      PT SHORT TERM GOAL #1   Title  independent with initial HEP    Status  Achieved        PT Long Term Goals - 07/03/17 0816      PT LONG TERM GOAL #1   Title  decrease pain 50%    Status  Achieved      PT LONG TERM GOAL #4   Title  go up and down stairs step over step    Status  Achieved            Plan - 07/03/17 0844    Clinical Impression Statement  All goals met, no issues with today's exercises. Pt does demo some LLE weakness when controlling stair descents.    Rehab Potential  Good    PT Frequency  3x / week    PT Duration  8 weeks    PT Treatment/Interventions  ADLs/Self Care Home Management;Cryotherapy;Electrical Stimulation;Gait training;Stair training;Functional mobility training;Patient/family education;Balance training;Therapeutic exercise;Therapeutic activities;Neuromuscular re-education;Manual techniques;Vasopneumatic Device    PT Next Visit Plan  D/C PT       Patient will benefit from skilled therapeutic intervention in order to improve the following deficits and impairments:  Abnormal gait, Cardiopulmonary status limiting activity, Decreased activity tolerance, Decreased balance, Decreased mobility, Decreased strength, Increased edema, Decreased scar mobility, Pain, Decreased range of motion, Difficulty walking  Visit Diagnosis: Acute pain of left knee  Difficulty in walking, not elsewhere classified  Stiffness of left knee, not elsewhere classified  Localized swelling, mass and lump, left lower limb     Problem List Patient Active Problem List   Diagnosis Date Noted  . S/P left TKA 04/28/2017  . S/P total knee replacement 04/28/2017  . Situational anxiety 03/17/2016  . Low testosterone 03/13/2015  .  Osteoarthritis,  multiple sites 08/22/2013  . Obesity (BMI 30-39.9) 05/08/2013  . 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  . Liver nodule 10/07/2012  . History of kidney stones 08/27/2012  . 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 Rotator cuff tear, left 01/23/2011  . Hyperlipidemia 10/11/2010  . BURSITIS, LEFT HIP 05/20/2010  . Controlled type 2 diabetes mellitus without complication ([place]) 12/12/7123  . CHRONIC TENSION TYPE HEADACHE 03/30/2009  . ELEVATED BLOOD PRESSURE 03/30/2009  . SHOULDER PAIN, LEFT 10/20/2008  . CONTUSION OF KNEE 10/20/2008  . ABNORMAL FINDINGS GI TRACT 06/23/2008    PHYSICAL THERAPY DISCHARGE SUMMARY  Visits from Start of Care: 27 Plan: Patient agrees to discharge.  Patient goals were met. Patient is being discharged due to meeting the stated rehab goals.  ?????       Scot Jun, PTA 07/03/2017, 8:45 AM  [address] [place], [place], [address] Phone: [phone]   Fax:  [phone]  Name: OMA ALPERT MRN: [id number] Date of Birth: 1960-11-01

## 2017-07-27 ENCOUNTER — Other Ambulatory Visit: Payer: Self-pay | Admitting: Family Medicine

## 2017-08-11 ENCOUNTER — Ambulatory Visit (INDEPENDENT_AMBULATORY_CARE_PROVIDER_SITE_OTHER): Payer: BLUE CROSS/BLUE SHIELD | Admitting: Family Medicine

## 2017-08-11 ENCOUNTER — Encounter: Payer: Self-pay | Admitting: Family Medicine

## 2017-08-11 VITALS — BP 120/78 | HR 77 | Temp 98.3°F | Wt 278.9 lb

## 2017-08-11 DIAGNOSIS — G4700 Insomnia, unspecified: Secondary | ICD-10-CM | POA: Diagnosis not present

## 2017-08-11 DIAGNOSIS — E119 Type 2 diabetes mellitus without complications: Secondary | ICD-10-CM

## 2017-08-11 LAB — POCT GLYCOSYLATED HEMOGLOBIN (HGB A1C): Hemoglobin A1C: 6.7

## 2017-08-11 MED ORDER — METFORMIN HCL 500 MG PO TABS: ORAL_TABLET | ORAL | 3 refills | Status: DC

## 2017-08-11 MED ORDER — TRAZODONE HCL 50 MG PO TABS: 25.0000 mg | ORAL_TABLET | Freq: Every evening | ORAL | 3 refills | Status: DC | PRN

## 2017-08-11 NOTE — Patient Instructions (Signed)

## 2017-08-11 NOTE — Progress Notes (Signed)
Subjective:     Patient ID: [name], male   DOB: Aug 15, 1960, 57 y.o.   MRN: [id number]  HPI Patient seen for follow-up type 2 diabetes. He had recent left total knee replacement. Preoperative hemoglobin A1c 7.9%. He's been taking Jardiance and metformin. He is concerned because of cost of Jardiance. He's actually gained a few pounds since his surgery. He has just finished up rehabilitation. Knee is healing very well. No polyuria or polydipsia. Poor compliance with diet.  He has issues with some insomnia. He has difficulty falling asleep and staying asleep. Has tried melatonin up to 15 mg without improvement. Previously took Ambien but did not seem to work well. He takes occasional lorazepam. He also tried Benadryl up to 75 mg which quit working. No regular alcohol use. Avoids late day use of caffeine.  Past Medical History:  Diagnosis Date  . Arthritis   . BURSITIS, LEFT HIP 05/20/2010  . Cancer [facility]) 2010   pancreatic cancer  . Chronic kidney disease    stones; 13 stones, currently has 2 stones   . Claustrophobia    "take Lorazepam if I have to fly"  . DIAB W/O COMP TYPE II/UNS NOT STATED UNCNTRL 03/30/2009  . ELEVATED BLOOD PRESSURE 03/30/2009  . Fissure, anal    occ bleeds still  . Headache(784.0)    "only when I was working; had headaches and migraines"  . Hyperlipidemia 10/11/2010  . SHOULDER PAIN, LEFT 10/20/2008   Past Surgical History:  Procedure Laterality Date  . ANAL FISSURE REPAIR  07/28/2011   Procedure: ANAL FISSURE REPAIR;  Surgeon: Adin Hector, MD;  [facility];  Service: General;  Laterality: N/A;  Repair Anal Fissure/sphinterotomy and excision rectal polypl  . BACK SURGERY  2000   "had 2 cages put in"  . BIOPSY PANCREAS  2010   benign  . HERNIA REPAIR  2458   umbilical  . KIDNEY STONES  2010 & 2008 & 2005  . LAPAROSCOPIC INCISIONAL / UMBILICAL / VENTRAL HERNIA REPAIR  07/08/11   VHR  . left rotator cuff  02/26/2011  . left shoulder bone spurs  2011   . LIPOMA EXCISION     "several; off back, arms"  . pancreatic tumor  2010  . right rotator cuff     2011  . TONSILLECTOMY  1967  . TOTAL KNEE ARTHROPLASTY Left 04/28/2017   Procedure: LEFT TOTAL KNEE ARTHROPLASTY;  Surgeon: Paralee Cancel, MD;  [facility];  Service: Orthopedics;  Laterality: Left;  Marland Kitchen VASECTOMY  1994  . VENTRAL HERNIA REPAIR  07/08/2011   Procedure: LAPAROSCOPIC VENTRAL HERNIA;  Surgeon: Adin Hector, MD;  Location: [facility];  Service: General;  Laterality: N/A;  Laparoscopic repair ventral hernia with mesh, Lysis of adhesions.    reports that  has never smoked. he has never used smokeless tobacco. He reports that he does not drink alcohol or use drugs. family history includes Cancer in his maternal grandfather; Colon cancer (age of onset: 51) in his maternal grandfather; Diabetes in his brother; Heart attack (age of onset: 48) in his father; Heart disease in his father; Stroke in his father. Allergies  Allergen Reactions  . Merthiolate [Thimerosol]     Unknown reaction  . Morphine     REACTION: paronia  . Nickel Dermatitis      Review of Systems  Constitutional: Negative for fatigue.  Eyes: Negative for visual disturbance.  Respiratory: Negative for cough, chest tightness and shortness of breath.   Cardiovascular: Negative  for chest pain, palpitations and leg swelling.  Endocrine: Negative for polydipsia and polyuria.  Neurological: Negative for dizziness, syncope, weakness, light-headedness and headaches.  Psychiatric/Behavioral: Positive for sleep disturbance.       Objective:   Physical Exam  Constitutional: He is oriented to person, place, and time. He appears well-developed and well-nourished.  HENT:  Right Ear: External ear normal.  Left Ear: External ear normal.  Mouth/Throat: Oropharynx is clear and moist.  Eyes: Pupils are equal, round, and reactive to light.  Neck: Neck supple. No thyromegaly present.  Cardiovascular: Normal rate and  regular rhythm.  Pulmonary/Chest: Effort normal and breath sounds normal. No respiratory distress. He has no wheezes. He has no rales.  Musculoskeletal: He exhibits no edema.  Neurological: He is alert and oriented to person, place, and time.       Assessment:     #1 type 2 diabetes. A1c 6.7%. Patient has concerns with cost issues with Jardiance  #2 insomnia    Plan:     -Handout on sleep hygiene given -Trial of trazodone 50 mg daily at bedtime when necessary insomnia -Continue current medication regimen for now. He is looking at problems with cost of Jardiance and we may need to look at alternative down the road. -We'll plan three-month follow-up and recheck A1c then -He is requesting going back to immediate release metformin because of cost issues with extended release  [name] MD [facility] at [facility]

## 2017-08-28 ENCOUNTER — Other Ambulatory Visit: Payer: Self-pay | Admitting: Family Medicine

## 2017-08-31 NOTE — Telephone Encounter (Signed)
LORazepam (ATIVAN) 1 MG tablet Last refill 05/11/17 and last office visit 08/11/17.  Okay to fill?

## 2017-08-31 NOTE — Telephone Encounter (Signed)
Refill Flexeril once Refill Celebrex for 6 months May refill lorazepam # 20 only and avoid regular use

## 2017-09-04 ENCOUNTER — Telehealth: Payer: Self-pay

## 2017-09-04 NOTE — Telephone Encounter (Signed)
Patient came in to office with c/o mid-sternal pain that is radiating up both sides of his neck. This pain has been present for 2 weeks. The patient also states he has some radiating tingling to his right hand. He reports that the morning his symptoms started he bent over to pick up his dog, weighing about 12lbs, and then felt the pain. He states that he has some radiating pain across his inner chest area when he takes a deep breath or moves a certain way. He reports that this does feel like a pulled muscle. Advised pt that the only way for [place] to ascertain if this is cardiac related would be to have him seen by a provider and possibly do an EKG. Pt does not wish to be seen at this time. He says he will go home, rest, take Ibuprofen and see how he feels. Also advised pt he can try moist heat to the area and see if that provides some relief. Educated pt on heart attack/cardiac s/s and pt states that his wife is a Marine scientist and will monitor him. Advised pt that if his condition changes, new s/s such as severe chest pain, radiating pain to jaw, headache, blurry vision or n/v occur he needs to seek immediate medical attention. Pt voiced understanding to all and, when asked again about making an appt, still denies wanting to be seen.  Dr. Elease Hashimoto - FYI. Thanks!

## 2017-09-26 ENCOUNTER — Other Ambulatory Visit: Payer: Self-pay | Admitting: Family Medicine

## 2017-10-16 ENCOUNTER — Other Ambulatory Visit: Payer: Self-pay | Admitting: Family Medicine

## 2017-10-16 NOTE — Telephone Encounter (Signed)
refill once.

## 2017-10-16 NOTE — Telephone Encounter (Signed)
Last OV 08/11/2017   Last refilled 09/01/2017 disp 60 with no refills   Sent to PCP for approval

## 2017-10-19 ENCOUNTER — Telehealth: Payer: Self-pay | Admitting: *Deleted

## 2017-10-19 MED ORDER — CYCLOBENZAPRINE HCL 10 MG PO TABS: ORAL_TABLET | ORAL | 0 refills | Status: DC

## 2017-10-19 NOTE — Telephone Encounter (Signed)
Oked refill last week.  Looks like refilled on 3-15?

## 2017-10-19 NOTE — Telephone Encounter (Signed)
[name]  [phone]  Refill request  cyclobenzaprine (FLEXERIL) 10 MG tablet  Last refill 09/01/17 and last office visit 08/11/17.  Okay to fill?

## 2017-11-02 DIAGNOSIS — M25562 Pain in left knee: Secondary | ICD-10-CM | POA: Diagnosis not present

## 2017-11-02 DIAGNOSIS — Z4889 Encounter for other specified surgical aftercare: Secondary | ICD-10-CM | POA: Diagnosis not present

## 2017-11-02 DIAGNOSIS — Z96659 Presence of unspecified artificial knee joint: Secondary | ICD-10-CM | POA: Diagnosis not present

## 2017-11-04 DIAGNOSIS — M25562 Pain in left knee: Secondary | ICD-10-CM | POA: Diagnosis not present

## 2017-11-06 DIAGNOSIS — M25562 Pain in left knee: Secondary | ICD-10-CM | POA: Diagnosis not present

## 2017-11-10 DIAGNOSIS — M25562 Pain in left knee: Secondary | ICD-10-CM | POA: Diagnosis not present

## 2017-11-12 DIAGNOSIS — M25562 Pain in left knee: Secondary | ICD-10-CM | POA: Diagnosis not present

## 2017-11-17 DIAGNOSIS — M25562 Pain in left knee: Secondary | ICD-10-CM | POA: Diagnosis not present

## 2017-11-19 ENCOUNTER — Other Ambulatory Visit: Payer: Self-pay | Admitting: Family Medicine

## 2017-11-19 DIAGNOSIS — M25562 Pain in left knee: Secondary | ICD-10-CM | POA: Diagnosis not present

## 2017-12-02 DIAGNOSIS — M25562 Pain in left knee: Secondary | ICD-10-CM | POA: Diagnosis not present

## 2017-12-22 ENCOUNTER — Ambulatory Visit: Payer: BLUE CROSS/BLUE SHIELD | Admitting: Family Medicine

## 2017-12-23 ENCOUNTER — Encounter: Payer: Self-pay | Admitting: Family Medicine

## 2017-12-23 ENCOUNTER — Ambulatory Visit (INDEPENDENT_AMBULATORY_CARE_PROVIDER_SITE_OTHER): Payer: BLUE CROSS/BLUE SHIELD | Admitting: Family Medicine

## 2017-12-23 VITALS — BP 110/80 | HR 72 | Temp 98.1°F | Wt 287.2 lb

## 2017-12-23 DIAGNOSIS — E119 Type 2 diabetes mellitus without complications: Secondary | ICD-10-CM | POA: Diagnosis not present

## 2017-12-23 LAB — POCT GLYCOSYLATED HEMOGLOBIN (HGB A1C): HEMOGLOBIN A1C: 6.7 % — AB (ref 4.0–5.6)

## 2017-12-23 MED ORDER — EMPAGLIFLOZIN 10 MG PO TABS: 10.0000 mg | ORAL_TABLET | Freq: Every day | ORAL | 3 refills | Status: DC

## 2017-12-23 NOTE — Progress Notes (Signed)
Subjective:     Patient ID: [name], male   DOB: 1960/11/01, 57 y.o.   MRN: [id number]  HPI  Patient seen for follow-up type 2 diabetes. His current regimen is metformin 500 mg 2 tablets twice daily and Jardiance. He is taking 10 mg every other day (vs daily) because of cost issues. His fasting blood sugars have been stable around 140. Poor compliance with diet. Inconsistent exercise. No polyuria or polydipsia.  Past Medical History:  Diagnosis Date  . Arthritis   . BURSITIS, LEFT HIP 05/20/2010  . Cancer [facility]) 2010   pancreatic cancer  . Chronic kidney disease    stones; 13 stones, currently has 2 stones   . Claustrophobia    "take Lorazepam if I have to fly"  . DIAB W/O COMP TYPE II/UNS NOT STATED UNCNTRL 03/30/2009  . ELEVATED BLOOD PRESSURE 03/30/2009  . Fissure, anal    occ bleeds still  . Headache(784.0)    "only when I was working; had headaches and migraines"  . Hyperlipidemia 10/11/2010  . SHOULDER PAIN, LEFT 10/20/2008   Past Surgical History:  Procedure Laterality Date  . ANAL FISSURE REPAIR  07/28/2011   Procedure: ANAL FISSURE REPAIR;  Surgeon: Adin Hector, MD;  [facility];  Service: General;  Laterality: N/A;  Repair Anal Fissure/sphinterotomy and excision rectal polypl  . BACK SURGERY  2000   "had 2 cages put in"  . BIOPSY PANCREAS  2010   benign  . HERNIA REPAIR  2706   umbilical  . KIDNEY STONES  2010 & 2008 & 2005  . LAPAROSCOPIC INCISIONAL / UMBILICAL / VENTRAL HERNIA REPAIR  07/08/11   VHR  . left rotator cuff  02/26/2011  . left shoulder bone spurs  2011  . LIPOMA EXCISION     "several; off back, arms"  . pancreatic tumor  2010  . right rotator cuff     2011  . TONSILLECTOMY  1967  . TOTAL KNEE ARTHROPLASTY Left 04/28/2017   Procedure: LEFT TOTAL KNEE ARTHROPLASTY;  Surgeon: Paralee Cancel, MD;  [facility];  Service: Orthopedics;  Laterality: Left;  Marland Kitchen VASECTOMY  1994  . VENTRAL HERNIA REPAIR  07/08/2011   Procedure: LAPAROSCOPIC  VENTRAL HERNIA;  Surgeon: Adin Hector, MD;  Location: [facility];  Service: General;  Laterality: N/A;  Laparoscopic repair ventral hernia with mesh, Lysis of adhesions.    reports that he has never smoked. He has never used smokeless tobacco. He reports that he does not drink alcohol or use drugs. family history includes Cancer in his maternal grandfather; Colon cancer (age of onset: 81) in his maternal grandfather; Diabetes in his brother; Heart attack (age of onset: 69) in his father; Heart disease in his father; Stroke in his father. Allergies  Allergen Reactions  . Merthiolate [Thimerosol]     Unknown reaction  . Morphine     REACTION: paronia  . Nickel Dermatitis    Review of Systems  Constitutional: Negative for chills and fever.  Endocrine: Negative for polydipsia and polyuria.  Genitourinary: Negative for dysuria.       Objective:   Physical Exam  Constitutional: He appears well-developed and well-nourished.  Cardiovascular: Normal rate and regular rhythm.  Pulmonary/Chest: Effort normal and breath sounds normal. No stridor. No respiratory distress. He has no wheezes. He has no rales.       Assessment:     Type 2 diabetes stable with A1c 6.7%    Plan:     -Refills of  Jardiance given -Continue metformin -would avoid sulfonlyureas with weight gain struggles He cannot take GLP-1 class because of past hx of pancreatitis -Plan routine follow-up in about 6 months and obtain further labs including lipids, hepatic, basic chemistries that time along with repeat A1c  [name] MD [facility] at [facility]

## 2017-12-29 ENCOUNTER — Encounter: Payer: Self-pay | Admitting: Family Medicine

## 2017-12-30 MED ORDER — METFORMIN HCL ER (MOD) 1000 MG PO TB24: 1000.0000 mg | ORAL_TABLET | Freq: Every day | ORAL | 1 refills | Status: DC

## 2018-01-04 MED ORDER — METFORMIN HCL ER 500 MG PO TB24: ORAL_TABLET | ORAL | 1 refills | Status: DC

## 2018-01-04 NOTE — Addendum Note (Signed)
Addended by: Westley Hummer B on: 01/04/2018 08:07 AM   Modules accepted: Orders

## 2018-02-02 ENCOUNTER — Encounter: Payer: Self-pay | Admitting: Family Medicine

## 2018-02-02 DIAGNOSIS — E785 Hyperlipidemia, unspecified: Secondary | ICD-10-CM

## 2018-02-02 MED ORDER — ROSUVASTATIN CALCIUM 10 MG PO TABS: 10.0000 mg | ORAL_TABLET | Freq: Every day | ORAL | 1 refills | Status: DC

## 2018-02-02 NOTE — Addendum Note (Signed)
Addended by: Westley Hummer B on: 02/02/2018 02:09 PM   Modules accepted: Orders

## 2018-02-10 ENCOUNTER — Other Ambulatory Visit: Payer: Self-pay | Admitting: Family Medicine

## 2018-02-15 ENCOUNTER — Other Ambulatory Visit: Payer: Self-pay | Admitting: Family Medicine

## 2018-03-15 ENCOUNTER — Telehealth: Payer: Self-pay | Admitting: *Deleted

## 2018-03-15 MED ORDER — CELECOXIB 200 MG PO CAPS: 200.0000 mg | ORAL_CAPSULE | Freq: Two times a day (BID) | ORAL | 0 refills | Status: DC

## 2018-03-15 NOTE — Telephone Encounter (Signed)
Levada Dy with [facility] called and said that she received the new scrip for the generic Celebrex. The script is for 60.  And he is taking 2x a day. The patient is requesting a 180 day supply.  Please advise Elon Alas # [phone]

## 2018-03-15 NOTE — Telephone Encounter (Signed)
New Rx sent for generic refill.

## 2018-03-15 NOTE — Telephone Encounter (Signed)
Copied from [place] [id number]. Topic: General - Other >> Mar 15, 2018  4:49 PM Yvette Rack wrote: Reason for CRM: Levada Dy with [facility] states a new Rx for celecoxib (CELEBREX) 200 MG capsule is needed because the pt wife is requesting generic medication. Levada Dy states a DAW code of 1 is listed on the Rx thererore she can not dispense. Cb# [id number]

## 2018-03-16 MED ORDER — CELECOXIB 200 MG PO CAPS: 200.0000 mg | ORAL_CAPSULE | Freq: Two times a day (BID) | ORAL | 0 refills | Status: DC

## 2018-03-16 NOTE — Addendum Note (Signed)
Addended by: Westley Hummer B on: 03/16/2018 04:29 PM   Modules accepted: Orders

## 2018-03-16 NOTE — Telephone Encounter (Signed)
Refill sent.

## 2018-03-18 ENCOUNTER — Other Ambulatory Visit: Payer: Self-pay | Admitting: Family Medicine

## 2018-03-24 ENCOUNTER — Other Ambulatory Visit: Payer: Self-pay | Admitting: Family Medicine

## 2018-04-06 DIAGNOSIS — E119 Type 2 diabetes mellitus without complications: Secondary | ICD-10-CM | POA: Diagnosis not present

## 2018-04-06 DIAGNOSIS — H524 Presbyopia: Secondary | ICD-10-CM | POA: Diagnosis not present

## 2018-04-06 LAB — HM DIABETES EYE EXAM

## 2018-04-08 ENCOUNTER — Encounter: Payer: Self-pay | Admitting: Family Medicine

## 2018-04-16 ENCOUNTER — Other Ambulatory Visit (INDEPENDENT_AMBULATORY_CARE_PROVIDER_SITE_OTHER): Payer: BLUE CROSS/BLUE SHIELD

## 2018-04-16 DIAGNOSIS — E785 Hyperlipidemia, unspecified: Secondary | ICD-10-CM

## 2018-04-16 LAB — LIPID PANEL
CHOLESTEROL: 144 mg/dL (ref 0–200)
HDL: 35.3 mg/dL — AB (ref >39.00)
NONHDL: 108.89
TRIGLYCERIDES: 384 mg/dL — AB (ref 0.0–149.0)
Total CHOL/HDL Ratio: 4
VLDL: 76.8 mg/dL — ABNORMAL HIGH (ref 0.0–40.0)

## 2018-04-16 LAB — HEPATIC FUNCTION PANEL
ALBUMIN: 4.4 g/dL (ref 3.5–5.2)
ALT: 29 U/L (ref 0–53)
AST: 34 U/L (ref 0–37)
Alkaline Phosphatase: 52 U/L (ref 39–117)
BILIRUBIN DIRECT: 0.1 mg/dL (ref 0.0–0.3)
TOTAL PROTEIN: 7.1 g/dL (ref 6.0–8.3)
Total Bilirubin: 0.8 mg/dL (ref 0.2–1.2)

## 2018-04-16 LAB — LDL CHOLESTEROL, DIRECT: Direct LDL: 73 mg/dL

## 2018-04-18 ENCOUNTER — Encounter: Payer: Self-pay | Admitting: Family Medicine

## 2018-04-21 DIAGNOSIS — D225 Melanocytic nevi of trunk: Secondary | ICD-10-CM | POA: Diagnosis not present

## 2018-04-21 DIAGNOSIS — Z1283 Encounter for screening for malignant neoplasm of skin: Secondary | ICD-10-CM | POA: Diagnosis not present

## 2018-04-21 DIAGNOSIS — Z08 Encounter for follow-up examination after completed treatment for malignant neoplasm: Secondary | ICD-10-CM | POA: Diagnosis not present

## 2018-04-21 DIAGNOSIS — Z85828 Personal history of other malignant neoplasm of skin: Secondary | ICD-10-CM | POA: Diagnosis not present

## 2018-05-06 ENCOUNTER — Encounter: Payer: Self-pay | Admitting: Family Medicine

## 2018-05-10 ENCOUNTER — Other Ambulatory Visit: Payer: Self-pay

## 2018-05-10 MED ORDER — ATORVASTATIN CALCIUM 20 MG PO TABS: 20.0000 mg | ORAL_TABLET | Freq: Every day | ORAL | 3 refills | Status: DC

## 2018-06-04 ENCOUNTER — Ambulatory Visit (INDEPENDENT_AMBULATORY_CARE_PROVIDER_SITE_OTHER): Payer: BLUE CROSS/BLUE SHIELD | Admitting: Family Medicine

## 2018-06-04 ENCOUNTER — Encounter: Payer: Self-pay | Admitting: Family Medicine

## 2018-06-04 VITALS — BP 120/80 | HR 66 | Temp 98.3°F | Wt 289.9 lb

## 2018-06-04 DIAGNOSIS — E119 Type 2 diabetes mellitus without complications: Secondary | ICD-10-CM | POA: Diagnosis not present

## 2018-06-04 DIAGNOSIS — M150 Primary generalized (osteo)arthritis: Secondary | ICD-10-CM | POA: Diagnosis not present

## 2018-06-04 DIAGNOSIS — M159 Polyosteoarthritis, unspecified: Secondary | ICD-10-CM

## 2018-06-04 DIAGNOSIS — M8949 Other hypertrophic osteoarthropathy, multiple sites: Secondary | ICD-10-CM

## 2018-06-04 DIAGNOSIS — F418 Other specified anxiety disorders: Secondary | ICD-10-CM

## 2018-06-04 LAB — POCT GLYCOSYLATED HEMOGLOBIN (HGB A1C): Hemoglobin A1C: 6.8 % — AB (ref 4.0–5.6)

## 2018-06-04 MED ORDER — CELECOXIB 200 MG PO CAPS: 200.0000 mg | ORAL_CAPSULE | Freq: Two times a day (BID) | ORAL | 5 refills | Status: DC

## 2018-06-04 MED ORDER — LORAZEPAM 1 MG PO TABS: 1.0000 mg | ORAL_TABLET | Freq: Four times a day (QID) | ORAL | 0 refills | Status: DC | PRN

## 2018-06-04 MED ORDER — CYCLOBENZAPRINE HCL 10 MG PO TABS: ORAL_TABLET | ORAL | 0 refills | Status: DC

## 2018-06-04 NOTE — Patient Instructions (Signed)
We will check on possible use of GLP- 1 medication such as Trulicity.

## 2018-06-04 NOTE — Progress Notes (Signed)
Subjective:     Patient ID: [name], male   DOB: 1961-02-20, 57 y.o.   MRN: [id number]  HPI Patient seen for medical follow-up.  He had left knee replacement last year and unfortunately has had some complications with ongoing chronic pain and swelling.  He is getting a second opinion.  This has limited his exercise.  Has had some mild weight gain because of inactivity.  Had been walking 3 to 5 miles per day until last year after his surgery  Type 2 diabetes.  Last A1c 6.7%.  Recent fasting blood sugars over 150.  He had some immediate release metformin and started taking that at night in addition to the extended release in the mornings.  Also takes [place].  Recent fasting blood sugars consistently around 150.  He is concerned about the weight gain.  He would like to explore other options.  He is not sure he wishes to look at gastric bypass at this point.  He does not have history of pancreatitis but has had benign pancreatic tumor with surgery several years ago  Requesting 1 refill of lorazepam.  He has upcoming trip and would like to have a few lorazepam for flying.  He has hyperlipidemia and on Lipitor.  He is also requesting refills of Celebrex and Flexeril.  Past Medical History:  Diagnosis Date  . Arthritis   . BURSITIS, LEFT HIP 05/20/2010  . Cancer [facility]) 2010   pancreatic cancer  . Chronic kidney disease    stones; 13 stones, currently has 2 stones   . Claustrophobia    "take Lorazepam if I have to fly"  . DIAB W/O COMP TYPE II/UNS NOT STATED UNCNTRL 03/30/2009  . ELEVATED BLOOD PRESSURE 03/30/2009  . Fissure, anal    occ bleeds still  . Headache(784.0)    "only when I was working; had headaches and migraines"  . Hyperlipidemia 10/11/2010  . SHOULDER PAIN, LEFT 10/20/2008   Past Surgical History:  Procedure Laterality Date  . ANAL FISSURE REPAIR  07/28/2011   Procedure: ANAL FISSURE REPAIR;  Surgeon: Adin Hector, MD;  Location: [facility];  Service: General;  Laterality:  N/A;  Repair Anal Fissure/sphinterotomy and excision rectal polypl  . BACK SURGERY  2000   "had 2 cages put in"  . BIOPSY PANCREAS  2010   benign  . HERNIA REPAIR  [id number]   umbilical  . KIDNEY STONES  2010 & 2008 & 2005  . LAPAROSCOPIC INCISIONAL / UMBILICAL / VENTRAL HERNIA REPAIR  07/08/11   VHR  . left rotator cuff  02/26/2011  . left shoulder bone spurs  2011  . LIPOMA EXCISION     "several; off back, arms"  . pancreatic tumor  2010  . right rotator cuff     2011  . TONSILLECTOMY  1967  . TOTAL KNEE ARTHROPLASTY Left 04/28/2017   Procedure: LEFT TOTAL KNEE ARTHROPLASTY;  Surgeon: Paralee Cancel, MD;  Location: [facility];  Service: Orthopedics;  Laterality: Left;  Marland Kitchen VASECTOMY  1994  . VENTRAL HERNIA REPAIR  07/08/2011   Procedure: LAPAROSCOPIC VENTRAL HERNIA;  Surgeon: Adin Hector, MD;  Location: [place] [facility];  Service: General;  Laterality: N/A;  Laparoscopic repair ventral hernia with mesh, Lysis of adhesions.    reports that he has never smoked. He has never used smokeless tobacco. He reports that he does not drink alcohol or use drugs. family history includes Cancer in his maternal grandfather; Colon cancer (age of onset: 73) in his maternal grandfather; Diabetes  in his brother; Heart attack (age of onset: 70) in his father; Heart disease in his father; Stroke in his father. Allergies  Allergen Reactions  . Merthiolate [Thimerosol]     Unknown reaction  . Morphine     REACTION: paronia  . Nickel Dermatitis     Review of Systems  Constitutional: Negative for fatigue and unexpected weight change.  Eyes: Negative for visual disturbance.  Respiratory: Negative for cough, chest tightness and shortness of breath.   Cardiovascular: Negative for chest pain, palpitations and leg swelling.  Endocrine: Negative for polydipsia and polyuria.  Musculoskeletal: Positive for arthralgias.  Neurological: Negative for dizziness, syncope, weakness, light-headedness and headaches.        Objective:   Physical Exam  Constitutional: He is oriented to person, place, and time. He appears well-developed and well-nourished.  HENT:  Right Ear: External ear normal.  Left Ear: External ear normal.  Mouth/Throat: Oropharynx is clear and moist.  Eyes: Pupils are equal, round, and reactive to light.  Neck: Neck supple. No thyromegaly present.  Cardiovascular: Normal rate and regular rhythm.  Pulmonary/Chest: Effort normal and breath sounds normal. No respiratory distress. He has no wheezes. He has no rales.  Musculoskeletal: He exhibits no edema.  Neurological: He is alert and oriented to person, place, and time.       Assessment:     #1 type 2 diabetes.  Relatively stable with A1c today 6.8%  #2 recent weight gain related to inactivity  #3 history of situational anxiety  #4 primary osteoarthritis involving multiple joints      Plan:     -Refilled lorazepam # 20 with no refill to use only as needed for severe anxiety with flying  -Refilled Flexeril and Celebrex  -Discussed management of his weight gain.  Very limited in physical activity at this point.  He has borderline diabetes control.  We explained medication such as a GLP-1 class could be effective in helping his weight control and diabetes control but with his past history of benign pancreatic issue would like to explore and research that further.  He does not have any history of pancreatitis.  [name] MD [facility] at [facility]

## 2018-06-10 ENCOUNTER — Encounter: Payer: Self-pay | Admitting: Family Medicine

## 2018-06-24 DIAGNOSIS — M25562 Pain in left knee: Secondary | ICD-10-CM | POA: Diagnosis not present

## 2018-07-23 DIAGNOSIS — M7632 Iliotibial band syndrome, left leg: Secondary | ICD-10-CM | POA: Diagnosis not present

## 2018-07-23 DIAGNOSIS — Z96652 Presence of left artificial knee joint: Secondary | ICD-10-CM | POA: Diagnosis not present

## 2018-07-23 DIAGNOSIS — M24662 Ankylosis, left knee: Secondary | ICD-10-CM | POA: Diagnosis not present

## 2018-08-12 ENCOUNTER — Telehealth: Payer: Self-pay | Admitting: Family Medicine

## 2018-08-12 NOTE — Telephone Encounter (Unsigned)
Copied from [place] [id number]. Topic: Quick Communication - Rx Refill/Question >> Aug 12, 2018  9:39 AM Percell Belt A wrote: Medication: cyclobenzaprine (FLEXERIL) 10 MG tablet [[id number]]  Has the patient contacted their pharmacy? Pharmacy needs new script , new pharmacy for pt  (Agent: If no, request that the patient contact the pharmacy for the refill.) (Agent: If yes, when and what did the pharmacy advise?)  Preferred Pharmacy (with phone number or street name): [facility] [address] [place], [place]. [phone] (Phone)  Agent: Please be advised that RX refills may take up to 3 business days. We ask that you follow-up with your pharmacy.

## 2018-08-13 ENCOUNTER — Other Ambulatory Visit: Payer: Self-pay

## 2018-08-13 MED ORDER — CYCLOBENZAPRINE HCL 10 MG PO TABS: ORAL_TABLET | ORAL | 0 refills | Status: DC

## 2018-08-13 NOTE — Telephone Encounter (Signed)
Refill has been sent to the requested pharmacy

## 2018-08-13 NOTE — Telephone Encounter (Signed)
Last OV 06/04/18, No future OV  Last filled 06/04/18, #60 with 0 refills  OK to continue to fill?

## 2018-08-13 NOTE — Telephone Encounter (Signed)
Refill OK

## 2018-09-09 ENCOUNTER — Other Ambulatory Visit: Payer: Self-pay | Admitting: Family Medicine

## 2018-09-22 ENCOUNTER — Other Ambulatory Visit: Payer: Self-pay | Admitting: Family Medicine

## 2018-09-28 DIAGNOSIS — Z9889 Other specified postprocedural states: Secondary | ICD-10-CM | POA: Diagnosis not present

## 2018-10-17 ENCOUNTER — Encounter: Payer: Self-pay | Admitting: Family Medicine

## 2018-10-18 ENCOUNTER — Other Ambulatory Visit: Payer: Self-pay | Admitting: Family Medicine

## 2018-10-20 ENCOUNTER — Other Ambulatory Visit: Payer: Self-pay

## 2018-10-20 MED ORDER — CELECOXIB 200 MG PO CAPS: 200.0000 mg | ORAL_CAPSULE | Freq: Two times a day (BID) | ORAL | 1 refills | Status: DC

## 2018-10-28 ENCOUNTER — Other Ambulatory Visit: Payer: Self-pay | Admitting: Family Medicine

## 2019-01-05 ENCOUNTER — Ambulatory Visit (INDEPENDENT_AMBULATORY_CARE_PROVIDER_SITE_OTHER): Payer: BC Managed Care – PPO | Admitting: Family Medicine

## 2019-01-05 ENCOUNTER — Other Ambulatory Visit: Payer: Self-pay

## 2019-01-05 DIAGNOSIS — E119 Type 2 diabetes mellitus without complications: Secondary | ICD-10-CM | POA: Diagnosis not present

## 2019-01-05 DIAGNOSIS — F5104 Psychophysiologic insomnia: Secondary | ICD-10-CM | POA: Diagnosis not present

## 2019-01-05 DIAGNOSIS — E785 Hyperlipidemia, unspecified: Secondary | ICD-10-CM | POA: Diagnosis not present

## 2019-01-05 LAB — BASIC METABOLIC PANEL
BUN: 16 mg/dL (ref 6–23)
CO2: 23 mEq/L (ref 19–32)
Calcium: 9.4 mg/dL (ref 8.4–10.5)
Chloride: 105 mEq/L (ref 96–112)
Creatinine, Ser: 0.6 mg/dL (ref 0.40–1.50)
GFR: 138.37 mL/min (ref >60.00)
Glucose, Bld: 144 mg/dL — ABNORMAL HIGH (ref 70–99)
Potassium: 4.4 mEq/L (ref 3.5–5.1)
Sodium: 138 mEq/L (ref 135–145)

## 2019-01-05 LAB — LIPID PANEL
Cholesterol: 130 mg/dL (ref 0–200)
HDL: 40 mg/dL (ref >39.00)
NonHDL: 90.29
Total CHOL/HDL Ratio: 3
Triglycerides: 227 mg/dL — ABNORMAL HIGH (ref 0.0–149.0)
VLDL: 45.4 mg/dL — ABNORMAL HIGH (ref 0.0–40.0)

## 2019-01-05 LAB — HEPATIC FUNCTION PANEL
ALT: 42 U/L (ref 0–53)
AST: 50 U/L — ABNORMAL HIGH (ref 0–37)
Albumin: 4.5 g/dL (ref 3.5–5.2)
Alkaline Phosphatase: 55 U/L (ref 39–117)
Bilirubin, Direct: 0.2 mg/dL (ref 0.0–0.3)
Total Bilirubin: 1 mg/dL (ref 0.2–1.2)
Total Protein: 7.2 g/dL (ref 6.0–8.3)

## 2019-01-05 LAB — HEMOGLOBIN A1C: Hgb A1c MFr Bld: 7.3 % — ABNORMAL HIGH (ref 4.6–6.5)

## 2019-01-05 LAB — LDL CHOLESTEROL, DIRECT: Direct LDL: 66 mg/dL

## 2019-01-05 MED ORDER — METFORMIN HCL ER 500 MG PO TB24: ORAL_TABLET | ORAL | 3 refills | Status: DC

## 2019-01-05 NOTE — Progress Notes (Signed)
Patient ID: FED CECI, male   DOB: Aug 10, 1960, 58 y.o.   MRN: [id number]  This visit type was conducted due to national recommendations for restrictions regarding the COVID-19 pandemic in an effort to limit this patient's exposure and mitigate transmission in our community.   Virtual Visit via Video Note  I connected with [name] on 01/05/19 at  8:30 AM EDT by a video enabled telemedicine application and verified that I am speaking with the correct person using two identifiers.  Location patient: home Location provider:work or home office Persons participating in the virtual visit: patient, provider  I discussed the limitations of evaluation and management by telemedicine and the availability of in person appointments. The patient expressed understanding and agreed to proceed.   HPI:  Patient for medical follow-up.  His chronic problems include type 2 diabetes, obesity, hyperlipidemia.  He has done excellent job with exercise and recently has been walking almost 2 hours/day.  He has not lost much weight but his belt size gone down 2 sizes.  Feels well overall.  His main concern is he has had some problems with chronic insomnia and especially recently.  He ends his current job later this month.  He has been recently taking Flexeril, melatonin, and Benadryl without much success.  He occasional takes lorazepam.  He is tried to taper off all medication for insomnia.  He has scaled back caffeine.  Very little alcohol.  No consistent alcohol consumption.  Has been taking combination of metformin extended release and immediate release.  For simplification, will transition to Metformin extended release only.   ROS: See pertinent positives and negatives per HPI.  Past Medical History:  Diagnosis Date  . Arthritis   . BURSITIS, LEFT HIP 05/20/2010  . Cancer [facility]) 2010   pancreatic cancer  . Chronic kidney disease    stones; 13 stones, currently has 2 stones   . Claustrophobia    "take  Lorazepam if I have to fly"  . DIAB W/O COMP TYPE II/UNS NOT STATED UNCNTRL 03/30/2009  . ELEVATED BLOOD PRESSURE 03/30/2009  . Fissure, anal    occ bleeds still  . Headache(784.0)    "only when I was working; had headaches and migraines"  . Hyperlipidemia 10/11/2010  . SHOULDER PAIN, LEFT 10/20/2008    Past Surgical History:  Procedure Laterality Date  . ANAL FISSURE REPAIR  07/28/2011   Procedure: ANAL FISSURE REPAIR;  Surgeon: Adin Hector, MD;  [facility];  Service: General;  Laterality: N/A;  Repair Anal Fissure/sphinterotomy and excision rectal polypl  . BACK SURGERY  2000   "had 2 cages put in"  . BIOPSY PANCREAS  2010   benign  . HERNIA REPAIR  2334   umbilical  . KIDNEY STONES  2010 & 2008 & 2005  . LAPAROSCOPIC INCISIONAL / UMBILICAL / VENTRAL HERNIA REPAIR  07/08/11   VHR  . left rotator cuff  02/26/2011  . left shoulder bone spurs  2011  . LIPOMA EXCISION     "several; off back, arms"  . pancreatic tumor  2010  . right rotator cuff     2011  . TONSILLECTOMY  1967  . TOTAL KNEE ARTHROPLASTY Left 04/28/2017   Procedure: LEFT TOTAL KNEE ARTHROPLASTY;  Surgeon: Paralee Cancel, MD;  [facility];  Service: Orthopedics;  Laterality: Left;  Marland Kitchen VASECTOMY  1994  . VENTRAL HERNIA REPAIR  07/08/2011   Procedure: LAPAROSCOPIC VENTRAL HERNIA;  Surgeon: Adin Hector, MD;  Location: [place];  Service:  General;  Laterality: N/A;  Laparoscopic repair ventral hernia with mesh, Lysis of adhesions.    Family History  Problem Relation Age of Onset  . Diabetes Brother   . Stroke Father   . Heart disease Father   . Heart attack Father 39  . Cancer Maternal Grandfather        colon  . Colon cancer Maternal Grandfather 67    SOCIAL HX: Non-smoker   Current Outpatient Medications:  .  atorvastatin (LIPITOR) 20 MG tablet, Take 1 tablet (20 mg total) by mouth daily., Disp: 90 tablet, Rfl: 3 .  celecoxib (CELEBREX) 200 MG capsule, Take 1 capsule (200 mg total) by mouth 2  (two) times daily., Disp: 180 capsule, Rfl: 1 .  cetirizine (ZYRTEC) 10 MG tablet, Take 10 mg by mouth at bedtime. , Disp: , Rfl:  .  cyclobenzaprine (FLEXERIL) 10 MG tablet, TAKE ONE TABLET BY MOUTH THREE TIMES A DAY AS NEEDED FOR MUSCLE SPASMS, Disp: 60 tablet, Rfl: 0 .  diphenhydrAMINE (BENADRYL) 50 MG capsule, Take 50 mg by mouth every 6 (six) hours as needed., Disp: , Rfl:  .  glucose blood (CONTOUR NEXT TEST) test strip, Use twice daily for glucose control.  Dx E11.9, Disp: 200 each, Rfl: 3 .  JARDIANCE 10 MG TABS tablet, TAKE ONE TABLET BY MOUTH DAILY, Disp: 90 tablet, Rfl: 1 .  Lancet Device MISC, Contour next test lancets.  Test once daily.  Dx E11.9, Disp: 100 each, Rfl: 3 .  LORazepam (ATIVAN) 1 MG tablet, Take 1 tablet (1 mg total) by mouth every 6 (six) hours as needed. for anxiety, Disp: 20 tablet, Rfl: 0 .  metFORMIN (GLUCOPHAGE-XR) 500 MG 24 hr tablet, TAKE 2 TABLETS BY MOUTH twice daily., Disp: 360 tablet, Rfl: 3 .  Multiple Vitamins-Minerals (CENTRUM PO), Take 1 tablet by mouth daily. , Disp: , Rfl:  .  OVER THE COUNTER MEDICATION, Melatonin 15 mg at bedtime, Disp: , Rfl:  .  potassium citrate (UROCIT-K) 10 MEQ (1080 MG) SR tablet, TAKE 1 TABLET BY MOUTH TWO TIMES A DAY... PATIENT NEEDS OFFICE VISIT, Disp: 180 each, Rfl: 1 .  ranitidine (ZANTAC) 150 MG tablet, Take 150 mg by mouth 2 (two) times daily., Disp: , Rfl:   EXAM:  VITALS per patient if applicable:  GENERAL: alert, oriented, appears well and in no acute distress  HEENT: atraumatic, conjunttiva clear, no obvious abnormalities on inspection of external nose and ears  NECK: normal movements of the head and neck  LUNGS: on inspection no signs of respiratory distress, breathing rate appears normal, no obvious gross SOB, gasping or wheezing  CV: no obvious cyanosis  MS: moves all visible extremities without noticeable abnormality  PSYCH/NEURO: pleasant and cooperative, no obvious depression or anxiety, speech and  thought processing grossly intact  ASSESSMENT AND PLAN:  Discussed the following assessment and plan:  #1 type 2 diabetes.  This has been fairly well controlled. -Repeat A1c -Refill metformin extended release and increase to 500 mg 2 tablets twice daily and discontinue immediate release metformin  #2 dyslipidemia -Lipitor and repeat lipids and hepatic panel today  #3 chronic intermittent insomnia -Sleep hygiene discussed.  Patient will try to minimize consistent medications.  We have tried trazodone in the past without much success.  He wishes to try to avoid medication such as Ambien and Lunesta.   I discussed the assessment and treatment plan with the patient. The patient was provided an opportunity to ask questions and all were answered. The patient agreed  with the plan and demonstrated an understanding of the instructions.   The patient was advised to call back or seek an in-person evaluation if the symptoms worsen or if the condition fails to improve as anticipated.   Carolann Littler, MD

## 2019-01-12 DIAGNOSIS — Z08 Encounter for follow-up examination after completed treatment for malignant neoplasm: Secondary | ICD-10-CM | POA: Diagnosis not present

## 2019-01-12 DIAGNOSIS — L301 Dyshidrosis [pompholyx]: Secondary | ICD-10-CM | POA: Diagnosis not present

## 2019-01-12 DIAGNOSIS — L570 Actinic keratosis: Secondary | ICD-10-CM | POA: Diagnosis not present

## 2019-01-12 DIAGNOSIS — X32XXXD Exposure to sunlight, subsequent encounter: Secondary | ICD-10-CM | POA: Diagnosis not present

## 2019-01-12 DIAGNOSIS — Z85828 Personal history of other malignant neoplasm of skin: Secondary | ICD-10-CM | POA: Diagnosis not present

## 2019-02-27 ENCOUNTER — Other Ambulatory Visit: Payer: Self-pay | Admitting: Family Medicine

## 2019-03-13 ENCOUNTER — Encounter: Payer: Self-pay | Admitting: Family Medicine

## 2019-03-13 ENCOUNTER — Other Ambulatory Visit: Payer: Self-pay | Admitting: Family Medicine

## 2019-03-14 ENCOUNTER — Other Ambulatory Visit: Payer: Self-pay

## 2019-03-14 MED ORDER — AMOXICILLIN 500 MG PO CAPS: ORAL_CAPSULE | ORAL | 0 refills | Status: DC

## 2019-05-08 ENCOUNTER — Other Ambulatory Visit: Payer: Self-pay | Admitting: Family Medicine

## 2019-05-17 ENCOUNTER — Other Ambulatory Visit: Payer: Self-pay | Admitting: Family Medicine

## 2019-05-19 NOTE — Telephone Encounter (Signed)
Please refill if appropriate thanks

## 2019-05-28 ENCOUNTER — Encounter: Payer: Self-pay | Admitting: Family Medicine

## 2019-05-31 ENCOUNTER — Other Ambulatory Visit: Payer: Self-pay

## 2019-05-31 DIAGNOSIS — Z8719 Personal history of other diseases of the digestive system: Secondary | ICD-10-CM

## 2019-07-18 ENCOUNTER — Other Ambulatory Visit: Payer: Self-pay | Admitting: Family Medicine

## 2019-07-18 MED ORDER — JARDIANCE 10 MG PO TABS: 10.0000 mg | ORAL_TABLET | Freq: Every day | ORAL | 0 refills | Status: DC

## 2019-07-18 NOTE — Telephone Encounter (Signed)
Medication Refill - Medication: JARDIANCE 10 MG TABS tablet  Patient has a physical schedule for 08/08/19   Preferred Pharmacy (with phone number or street name):  [facility] [place], [place] Phone:  [phone]  Fax:  [phone]       Agent: Please be advised that RX refills may take up to 3 business days. We ask that you follow-up with your pharmacy.

## 2019-07-18 NOTE — Telephone Encounter (Signed)
Requested medication (s) are due for refill today: yes  Requested medication (s) are on the active medication list: yes  Last refill:  05/08/2019  Future visit scheduled: yes  Notes to clinic:  patient has appointment coming up   Requested Prescriptions  Pending Prescriptions Disp Refills   empagliflozin (JARDIANCE) 10 MG TABS tablet 90 tablet 0    Sig: Take 10 mg by mouth daily.      Endocrinology:  Diabetes - SGLT2 Inhibitors Failed - 07/18/2019 10:38 AM      Failed - HBA1C is between 0 and 7.9 and within 180 days    Hgb A1c MFr Bld  Date Value Ref Range Status  01/05/2019 7.3 (H) 4.6 - 6.5 % Final    Comment:    Glycemic Control Guidelines for People with Diabetes:Non Diabetic:  <6%Goal of Therapy: <7%Additional Action Suggested:  >8%           Failed - Valid encounter within last 6 months    Recent Outpatient Visits           6 months ago Controlled type 2 diabetes mellitus without complication, without long-term current use of insulin ([place])   [place] at [facility], Alinda Sierras, MD   1 year ago Controlled type 2 diabetes mellitus without complication, without long-term current use of insulin ([place])   [facility], Alinda Sierras, MD   1 year ago Controlled type 2 diabetes mellitus without complication, without long-term current use of insulin ([place])   [facility], Alinda Sierras, MD   1 year ago Controlled type 2 diabetes mellitus without complication, without long-term current use of insulin ([place])   [facility], Alinda Sierras, MD   2 years ago Need for shingles vaccine   [facility], Alinda Sierras, MD       Future Appointments             In 3 weeks [place], Alinda Sierras, MD [place] [facility] [place], [place] in normal range and within 360 days    Creatinine, Ser  Date Value Ref Range Status  01/05/2019 0.60 0.40 - 1.50  mg/dL Final   Creatinine,U  Date Value Ref Range Status  03/23/2017 130.8 mg/dL Final          Passed - LDL in normal range and within 360 days    LDL Cholesterol  Date Value Ref Range Status  08/20/2012 86 0 - 99 mg/dL Final   Direct LDL  Date Value Ref Range Status  01/05/2019 66.0 mg/dL Final    Comment:    Optimal:  <100 mg/dLNear or Above Optimal:  100-129 mg/dLBorderline High:  130-159 mg/dLHigh:  160-189 mg/dLVery High:  >190 mg/dL          Passed - eGFR in normal range and within 360 days    GFR calc Af Amer  Date Value Ref Range Status  04/29/2017 >60 >60 mL/min Final    Comment:    (NOTE) The eGFR has been calculated using the CKD EPI equation. This calculation has not been validated in all clinical situations. eGFR's persistently <60 mL/min signify possible Chronic Kidney Disease.    GFR calc non Af Amer  Date Value Ref Range Status  04/29/2017 >60 >60 mL/min Final   GFR  Date Value Ref Range Status  01/05/2019 138.37 >60.00 mL/min Final

## 2019-08-08 ENCOUNTER — Other Ambulatory Visit: Payer: Self-pay

## 2019-08-08 ENCOUNTER — Ambulatory Visit (INDEPENDENT_AMBULATORY_CARE_PROVIDER_SITE_OTHER): Payer: BC Managed Care – PPO | Admitting: Family Medicine

## 2019-08-08 ENCOUNTER — Encounter: Payer: Self-pay | Admitting: Family Medicine

## 2019-08-08 ENCOUNTER — Telehealth: Payer: Self-pay | Admitting: *Deleted

## 2019-08-08 VITALS — BP 110/68 | HR 95 | Temp 97.4°F | Ht 73.5 in | Wt 262.2 lb

## 2019-08-08 DIAGNOSIS — E119 Type 2 diabetes mellitus without complications: Secondary | ICD-10-CM | POA: Diagnosis not present

## 2019-08-08 DIAGNOSIS — Z0000 Encounter for general adult medical examination without abnormal findings: Secondary | ICD-10-CM | POA: Diagnosis not present

## 2019-08-08 LAB — HEPATIC FUNCTION PANEL
ALT: 18 U/L (ref 0–53)
AST: 27 U/L (ref 0–37)
Albumin: 4.9 g/dL (ref 3.5–5.2)
Alkaline Phosphatase: 58 U/L (ref 39–117)
Bilirubin, Direct: 0.2 mg/dL (ref 0.0–0.3)
Total Bilirubin: 1.2 mg/dL (ref 0.2–1.2)
Total Protein: 7.6 g/dL (ref 6.0–8.3)

## 2019-08-08 LAB — CBC WITH DIFFERENTIAL/PLATELET
Basophils Absolute: 0 10*3/uL (ref 0.0–0.1)
Basophils Relative: 0.3 % (ref 0.0–3.0)
Eosinophils Absolute: 0.1 10*3/uL (ref 0.0–0.7)
Eosinophils Relative: 1.4 % (ref 0.0–5.0)
HCT: 52.1 % — ABNORMAL HIGH (ref 39.0–52.0)
Hemoglobin: 17.7 g/dL — ABNORMAL HIGH (ref 13.0–17.0)
Lymphocytes Relative: 24 % (ref 12.0–46.0)
Lymphs Abs: 1.9 10*3/uL (ref 0.7–4.0)
MCHC: 33.9 g/dL (ref 30.0–36.0)
MCV: 90.5 fl (ref 78.0–100.0)
Monocytes Absolute: 0.4 10*3/uL (ref 0.1–1.0)
Monocytes Relative: 5.5 % (ref 3.0–12.0)
Neutro Abs: 5.4 10*3/uL (ref 1.4–7.7)
Neutrophils Relative %: 68.8 % (ref 43.0–77.0)
Platelets: 240 10*3/uL (ref 150.0–400.0)
RBC: 5.76 Mil/uL (ref 4.22–5.81)
RDW: 12.7 % (ref 11.5–15.5)
WBC: 7.9 10*3/uL (ref 4.0–10.5)

## 2019-08-08 LAB — PSA: PSA: 1 ng/mL (ref 0.10–4.00)

## 2019-08-08 LAB — BASIC METABOLIC PANEL
BUN: 16 mg/dL (ref 6–23)
CO2: 28 mEq/L (ref 19–32)
Calcium: 10.1 mg/dL (ref 8.4–10.5)
Chloride: 104 mEq/L (ref 96–112)
Creatinine, Ser: 0.84 mg/dL (ref 0.40–1.50)
GFR: 93.65 mL/min (ref >60.00)
Glucose, Bld: 112 mg/dL — ABNORMAL HIGH (ref 70–99)
Potassium: 4.8 mEq/L (ref 3.5–5.1)
Sodium: 142 mEq/L (ref 135–145)

## 2019-08-08 LAB — POCT GLYCOSYLATED HEMOGLOBIN (HGB A1C): Hemoglobin A1C: 6.1 % — AB (ref 4.0–5.6)

## 2019-08-08 LAB — TSH: TSH: 1.96 u[IU]/mL (ref 0.35–4.50)

## 2019-08-08 LAB — VITAMIN B12: Vitamin B-12: 494 pg/mL (ref 211–911)

## 2019-08-08 MED ORDER — ATORVASTATIN CALCIUM 20 MG PO TABS: 20.0000 mg | ORAL_TABLET | Freq: Every day | ORAL | 3 refills | Status: DC

## 2019-08-08 MED ORDER — METFORMIN HCL ER 500 MG PO TB24: ORAL_TABLET | ORAL | 3 refills | Status: DC

## 2019-08-08 MED ORDER — CYCLOBENZAPRINE HCL 10 MG PO TABS: ORAL_TABLET | ORAL | 3 refills | Status: DC

## 2019-08-08 MED ORDER — POTASSIUM CITRATE ER 10 MEQ (1080 MG) PO TBCR: EXTENDED_RELEASE_TABLET | ORAL | 3 refills | Status: DC

## 2019-08-08 MED ORDER — CELECOXIB 200 MG PO CAPS: 200.0000 mg | ORAL_CAPSULE | Freq: Two times a day (BID) | ORAL | 3 refills | Status: DC

## 2019-08-08 MED ORDER — LORAZEPAM 1 MG PO TABS: 1.0000 mg | ORAL_TABLET | Freq: Two times a day (BID) | ORAL | 0 refills | Status: DC | PRN

## 2019-08-08 MED ORDER — JARDIANCE 10 MG PO TABS: 10.0000 mg | ORAL_TABLET | Freq: Every day | ORAL | 3 refills | Status: DC

## 2019-08-08 NOTE — Telephone Encounter (Signed)
Copied from [place] [phone]. Topic: General - Other >> Aug 08, 2019  8:49 AM Rayann Heman wrote: Reason for CRM:pt called and stated that he has a physical today and would like to know if he will have labs. Please advise

## 2019-08-08 NOTE — Telephone Encounter (Signed)
Labs were done on 01/05/2019. I advised patient that we will do a POCT Hemoglobin A1c test today. Does patient needs any further labs?

## 2019-08-08 NOTE — Progress Notes (Signed)
Subjective:     Patient ID: [name], male   DOB: 10/14/1960, 59 y.o.   MRN: [id number]  HPI Grover is seen for physical exam.  He has chronic problems including history of obesity, type 2 diabetes, osteoarthritis involving multiple joints, hyperlipidemia, history of recurrent kidney stones.  He started walking earlier this year and is walking up to 3 to 10 miles per day and has lost over 20 pounds.  Feels good overall.  He states his fasting blood sugars are still frequently up around 150.  His last A1c was 7.3%.  Past history of mildly elevated AST enzyme.  Suspected fatty liver.  Due for urine microalbumin.  He does complain of his feet frequently feeling cold but he does not describe any claudication type symptoms.  Has occasional mild numbness from the ball of the feet distally bilaterally.  Past Medical History:  Diagnosis Date  . Arthritis   . BURSITIS, LEFT HIP 05/20/2010  . Cancer [facility]) 2010   pancreatic cancer  . Chronic kidney disease    stones; 13 stones, currently has 2 stones   . Claustrophobia    "take Lorazepam if I have to fly"  . DIAB W/O COMP TYPE II/UNS NOT STATED UNCNTRL 03/30/2009  . ELEVATED BLOOD PRESSURE 03/30/2009  . Fissure, anal    occ bleeds still  . Headache(784.0)    "only when I was working; had headaches and migraines"  . Hyperlipidemia 10/11/2010  . SHOULDER PAIN, LEFT 10/20/2008   Past Surgical History:  Procedure Laterality Date  . ANAL FISSURE REPAIR  07/28/2011   Procedure: ANAL FISSURE REPAIR;  Surgeon: Adin Hector, MD;  Location: [facility];  Service: General;  Laterality: N/A;  Repair Anal Fissure/sphinterotomy and excision rectal polypl  . BACK SURGERY  2000   "had 2 cages put in"  . BIOPSY PANCREAS  2010   benign  . HERNIA REPAIR  [id number]   umbilical  . KIDNEY STONES  2010 & 2008 & 2005  . LAPAROSCOPIC INCISIONAL / UMBILICAL / VENTRAL HERNIA REPAIR  07/08/11   VHR  . left rotator cuff  02/26/2011  . left shoulder bone spurs  2011  .  LIPOMA EXCISION     "several; off back, arms"  . pancreatic tumor  2010  . right rotator cuff     2011  . TONSILLECTOMY  1967  . TOTAL KNEE ARTHROPLASTY Left 04/28/2017   Procedure: LEFT TOTAL KNEE ARTHROPLASTY;  Surgeon: Paralee Cancel, MD;  Location: [facility];  Service: Orthopedics;  Laterality: Left;  Marland Kitchen VASECTOMY  1994  . VENTRAL HERNIA REPAIR  07/08/2011   Procedure: LAPAROSCOPIC VENTRAL HERNIA;  Surgeon: Adin Hector, MD;  Location: [place];  Service: General;  Laterality: N/A;  Laparoscopic repair ventral hernia with mesh, Lysis of adhesions.    reports that he has never smoked. He has never used smokeless tobacco. He reports that he does not drink alcohol or use drugs. family history includes Cancer in his maternal grandfather; Colon cancer (age of onset: 76) in his maternal grandfather; Diabetes in his brother; Heart attack (age of onset: 70) in his father; Heart disease in his father; Stroke in his father. Allergies  Allergen Reactions  . Merthiolate [Thimerosol]     Unknown reaction  . Morphine     REACTION: paronia  . Nickel Dermatitis     Review of Systems  Constitutional: Negative for activity change, appetite change, fatigue and fever.  HENT: Negative for congestion, ear pain and trouble swallowing.  Eyes: Negative for pain and visual disturbance.  Respiratory: Negative for cough, shortness of breath and wheezing.   Cardiovascular: Negative for chest pain and palpitations.  Gastrointestinal: Negative for abdominal distention, abdominal pain, blood in stool, constipation, diarrhea, nausea, rectal pain and vomiting.  Genitourinary: Negative for dysuria, hematuria and testicular pain.  Musculoskeletal: Negative for arthralgias and joint swelling.  Skin: Negative for rash.  Neurological: Positive for numbness. Negative for dizziness, syncope and headaches.  Hematological: Negative for adenopathy.  Psychiatric/Behavioral: Negative for confusion and dysphoric mood.        Objective:   Physical Exam Constitutional:      General: He is not in acute distress.    Appearance: He is well-developed.  HENT:     Head: Normocephalic and atraumatic.     Right Ear: External ear normal.     Left Ear: External ear normal.  Eyes:     Conjunctiva/sclera: Conjunctivae normal.     Pupils: Pupils are equal, round, and reactive to light.  Neck:     Thyroid: No thyromegaly.  Cardiovascular:     Rate and Rhythm: Normal rate and regular rhythm.     Heart sounds: Normal heart sounds. No murmur.  Pulmonary:     Effort: No respiratory distress.     Breath sounds: No wheezing or rales.  Abdominal:     General: Bowel sounds are normal. There is no distension.     Palpations: Abdomen is soft. There is no mass.     Tenderness: There is no abdominal tenderness. There is no guarding or rebound.     Comments: Diastases recti but no definitive hernia noted  Musculoskeletal:     Cervical back: Normal range of motion and neck supple.  Lymphadenopathy:     Cervical: No cervical adenopathy.  Skin:    Findings: No rash.     Comments: Feet reveal no skin lesions. Good distal foot pulses. Good capillary refill. No calluses. Normal sensation with monofilament testing-with exception of minimal impairment with great toe bilaterally volar surface   Neurological:     Mental Status: He is alert and oriented to person, place, and time.     Cranial Nerves: No cranial nerve deficit.     Deep Tendon Reflexes: Reflexes normal.        Assessment:     Physical exam.  He has type 2 diabetes with improving control with his weight loss with A1c today 6.1%    Plan:     -Check further labs with urine microalbumin, PSA, hepatic panel, TSH, B12 in view of his recent numbness and also in view of his chronic Metformin use which can reduce B12 absorption  -Continue weight loss efforts  -Refill several medications today including Celebrex, Flexeril, Lipitor, Metformin, Jardiance, and potassium  citrate  -Recommend 78-month routine follow-up  [name] MD [facility]

## 2019-08-08 NOTE — Telephone Encounter (Signed)
Will address at physical

## 2019-08-11 DIAGNOSIS — E119 Type 2 diabetes mellitus without complications: Secondary | ICD-10-CM | POA: Diagnosis not present

## 2019-08-11 DIAGNOSIS — K432 Incisional hernia without obstruction or gangrene: Secondary | ICD-10-CM | POA: Diagnosis not present

## 2019-08-11 DIAGNOSIS — E785 Hyperlipidemia, unspecified: Secondary | ICD-10-CM | POA: Diagnosis not present

## 2019-08-11 DIAGNOSIS — D171 Benign lipomatous neoplasm of skin and subcutaneous tissue of trunk: Secondary | ICD-10-CM | POA: Diagnosis not present

## 2019-08-11 LAB — HM DIABETES EYE EXAM

## 2019-11-08 DIAGNOSIS — M25562 Pain in left knee: Secondary | ICD-10-CM | POA: Diagnosis not present

## 2019-11-08 DIAGNOSIS — Z96652 Presence of left artificial knee joint: Secondary | ICD-10-CM | POA: Diagnosis not present

## 2019-11-16 DIAGNOSIS — R200 Anesthesia of skin: Secondary | ICD-10-CM | POA: Diagnosis not present

## 2020-01-11 DIAGNOSIS — Z1283 Encounter for screening for malignant neoplasm of skin: Secondary | ICD-10-CM | POA: Diagnosis not present

## 2020-01-11 DIAGNOSIS — L821 Other seborrheic keratosis: Secondary | ICD-10-CM | POA: Diagnosis not present

## 2020-02-15 ENCOUNTER — Other Ambulatory Visit: Payer: Self-pay | Admitting: Family Medicine

## 2020-02-15 NOTE — Telephone Encounter (Signed)
Please advise 

## 2020-03-08 DIAGNOSIS — Z01818 Encounter for other preprocedural examination: Secondary | ICD-10-CM | POA: Diagnosis not present

## 2020-03-12 ENCOUNTER — Other Ambulatory Visit: Payer: Self-pay | Admitting: General Surgery

## 2020-03-12 DIAGNOSIS — D171 Benign lipomatous neoplasm of skin and subcutaneous tissue of trunk: Secondary | ICD-10-CM | POA: Diagnosis not present

## 2020-03-21 DIAGNOSIS — Z20822 Contact with and (suspected) exposure to covid-19: Secondary | ICD-10-CM | POA: Diagnosis not present

## 2020-03-21 DIAGNOSIS — Z03818 Encounter for observation for suspected exposure to other biological agents ruled out: Secondary | ICD-10-CM | POA: Diagnosis not present

## 2020-03-30 ENCOUNTER — Other Ambulatory Visit: Payer: Self-pay | Admitting: Family Medicine

## 2020-05-03 ENCOUNTER — Encounter: Payer: Self-pay | Admitting: Cardiology

## 2020-05-03 ENCOUNTER — Other Ambulatory Visit: Payer: Self-pay

## 2020-05-03 ENCOUNTER — Ambulatory Visit (INDEPENDENT_AMBULATORY_CARE_PROVIDER_SITE_OTHER)
Admission: RE | Admit: 2020-05-03 | Discharge: 2020-05-03 | Disposition: A | Discharge status: Home / Self Care | Payer: Self-pay | Source: Ambulatory Visit | Attending: Cardiology | Admitting: Cardiology

## 2020-05-03 ENCOUNTER — Ambulatory Visit (INDEPENDENT_AMBULATORY_CARE_PROVIDER_SITE_OTHER): Payer: BC Managed Care – PPO | Admitting: Cardiology

## 2020-05-03 VITALS — BP 110/80 | HR 78 | Ht 73.5 in | Wt 262.0 lb

## 2020-05-03 DIAGNOSIS — E785 Hyperlipidemia, unspecified: Secondary | ICD-10-CM | POA: Diagnosis not present

## 2020-05-03 DIAGNOSIS — R030 Elevated blood-pressure reading, without diagnosis of hypertension: Secondary | ICD-10-CM

## 2020-05-03 DIAGNOSIS — E1169 Type 2 diabetes mellitus with other specified complication: Secondary | ICD-10-CM

## 2020-05-03 DIAGNOSIS — Z8249 Family history of ischemic heart disease and other diseases of the circulatory system: Secondary | ICD-10-CM

## 2020-05-03 NOTE — Progress Notes (Signed)
Cardiology Office Note:    Date:  05/03/2020   ID:  [name], DOB 1961-06-21, MRN [id number]  PCP:  [name], MD  [facility] Cardiologist:  Candee Furbish, MD  [facility] Electrophysiologist:  None   Referring MD: [name], MD     History of Present Illness:    [name] is a 59 y.o. male here for evaluation of cardiac status hypertension hyperlipidemia diabetes at the request of Dr. Elease Hashimoto.  Previously was seen here in 2014, noted to have LVH on stress echocardiogram in 2008 but no other issues.   He underwent a nuclear stress test back in 2014 that was unremarkable.  Left knee replacement.  Since then, no more running, he has been losing weight however 40 pounds with walking.  Sometimes he will walk at a 15 mile clip.  No real shortness of breath with activity.  No significant chest pain.  No syncope.  Sometimes when bending over and getting up quickly he may feel some lightheadedness.  Father - CVA and MI in hospital.   He does have occasional neuropathy in his toes.   His wife is a Marine scientist,  Contractor.   Past Medical History:  Diagnosis Date  . Arthritis   . BURSITIS, LEFT HIP 05/20/2010  . Cancer [facility]) 2010   pancreatic cancer  . Chronic kidney disease    stones; 13 stones, currently has 2 stones   . Claustrophobia    "take Lorazepam if I have to fly"  . DIAB W/O COMP TYPE II/UNS NOT STATED UNCNTRL 03/30/2009  . ELEVATED BLOOD PRESSURE 03/30/2009  . Fissure, anal    occ bleeds still  . Headache(784.0)    "only when I was working; had headaches and migraines"  . Hyperlipidemia 10/11/2010  . SHOULDER PAIN, LEFT 10/20/2008    Past Surgical History:  Procedure Laterality Date  . ANAL FISSURE REPAIR  07/28/2011   Procedure: ANAL FISSURE REPAIR;  Surgeon: Adin Hector, MD;  [facility];  Service: General;  Laterality: N/A;  Repair Anal Fissure/sphinterotomy and excision rectal polypl  . BACK SURGERY  2000   "had 2 cages put in"    . BIOPSY PANCREAS  2010   benign  . HERNIA REPAIR  [id number]   umbilical  . KIDNEY STONES  2010 & 2008 & 2005  . LAPAROSCOPIC INCISIONAL / UMBILICAL / VENTRAL HERNIA REPAIR  07/08/11   VHR  . left rotator cuff  02/26/2011  . left shoulder bone spurs  2011  . LIPOMA EXCISION     "several; off back, arms"  . pancreatic tumor  2010  . right rotator cuff     2011  . TONSILLECTOMY  1967  . TOTAL KNEE ARTHROPLASTY Left 04/28/2017   Procedure: LEFT TOTAL KNEE ARTHROPLASTY;  Surgeon: Paralee Cancel, MD;  [facility];  Service: Orthopedics;  Laterality: Left;  Marland Kitchen VASECTOMY  1994  . VENTRAL HERNIA REPAIR  07/08/2011   Procedure: LAPAROSCOPIC VENTRAL HERNIA;  Surgeon: Adin Hector, MD;  Location: [place];  Service: General;  Laterality: N/A;  Laparoscopic repair ventral hernia with mesh, Lysis of adhesions.    Current Medications: Current Meds  Medication Sig  . amoxicillin (AMOXIL) 500 MG capsule Take 4 capsules (2,000 mg) 1 hour before dental cleaning.  Marland Kitchen atorvastatin (LIPITOR) 20 MG tablet Take 1 tablet (20 mg total) by mouth daily.  . celecoxib (CELEBREX) 200 MG capsule Take 1 capsule (200 mg total) by mouth 2 (two) times daily.  Marland Kitchen  cetirizine (ZYRTEC) 10 MG tablet Take 10 mg by mouth as needed.   . cyclobenzaprine (FLEXERIL) 10 MG tablet TAKE ONE TABLET BY MOUTH EVERY 8 HOURS AS NEEDED FOR MUSCLE SPASM  . diphenhydrAMINE (BENADRYL) 50 MG capsule Take 50 mg by mouth every 6 (six) hours as needed.  . empagliflozin (JARDIANCE) 10 MG TABS tablet Take 10 mg by mouth daily.  Marland Kitchen glucose blood (CONTOUR NEXT TEST) test strip Use twice daily for glucose control.  Dx E11.9  . Lancet Device MISC Contour next test lancets.  Test once daily.  Dx E11.9  . LORazepam (ATIVAN) 1 MG tablet TAKE ONE TABLET BY MOUTH TWICE A DAY AS NEEDED FOR ANXIETY  . metFORMIN (GLUCOPHAGE-XR) 500 MG 24 hr tablet TAKE 2 TABLETS BY MOUTH twice daily.  . Multiple Vitamins-Minerals (CENTRUM PO) Take 1 tablet by mouth daily.   Marland Kitchen  OVER THE COUNTER MEDICATION Melatonin 15 mg at bedtime  . potassium citrate (UROCIT-K) 10 MEQ (1080 MG) SR tablet TAKE 1 TABLET BY MOUTH TWO TIMES A DAY...     Allergies:   Merthiolate [thimerosol], Morphine, and Nickel   Social History   Socioeconomic History  . Marital status: Married    Spouse name: Not on file  . Number of children: 1  . Years of education: Not on file  . Highest education level: Not on file  Occupational History  . Occupation: Retired-part time Barrister's clerk  Tobacco Use  . Smoking status: Never Smoker  . Smokeless tobacco: Never Used  Vaping Use  . Vaping Use: Never used  Substance and Sexual Activity  . Alcohol use: No    Comment: 07/08/11 "maybe 2-3 times a year"  . Drug use: No  . Sexual activity: Yes  Other Topics Concern  . Not on file  Social History Narrative  . Not on file   Social Determinants of Health   Financial Resource Strain:   . Difficulty of Paying Living Expenses: Not on file  Food Insecurity:   . Worried About Charity fundraiser in the Last Year: Not on file  . Ran Out of Food in the Last Year: Not on file  Transportation Needs:   . Lack of Transportation (Medical): Not on file  . Lack of Transportation (Non-Medical): Not on file  Physical Activity:   . Days of Exercise per Week: Not on file  . Minutes of Exercise per Session: Not on file  Stress:   . Feeling of Stress : Not on file  Social Connections:   . Frequency of Communication with Friends and Family: Not on file  . Frequency of Social Gatherings with Friends and Family: Not on file  . Attends Religious Services: Not on file  . Active Member of Clubs or Organizations: Not on file  . Attends Archivist Meetings: Not on file  . Marital Status: Not on file     Family History: [name] family history includes Cancer in his maternal grandfather; Colon cancer (age of onset: 46) in his maternal grandfather; Diabetes in his brother; Heart attack (age of  onset: 86) in his father; Heart disease in his father; Stroke in his father.  ROS:   Please see the history of present illness.     All other systems reviewed and are negative.  EKGs/Labs/Other Studies Reviewed:    The following studies were reviewed today:   EKG:  EKG is  ordered today.  The ekg ordered today demonstrates sinus rhythm 78 with left anterior fascicular block  Recent Labs: 08/08/2019: ALT 18; BUN 16; Creatinine, Ser 0.84; Hemoglobin 17.7; Platelets 240.0; Potassium 4.8; Sodium 142; TSH 1.96  Recent Lipid Panel    Component Value Date/Time   CHOL 130 01/05/2019 0904   TRIG 227.0 (H) 01/05/2019 0904   HDL 40.00 01/05/2019 0904   CHOLHDL 3 01/05/2019 0904   VLDL 45.4 (H) 01/05/2019 0904   LDLCALC 86 08/20/2012 0816   LDLDIRECT 66.0 01/05/2019 0904    Physical Exam:    VS:  BP 110/80   Pulse 78   Ht 6' 1.5" (1.867 m)   Wt 262 lb (118.8 kg)   SpO2 94%   BMI 34.10 kg/m     Wt Readings from Last 3 Encounters:  05/03/20 262 lb (118.8 kg)  08/08/19 262 lb 3.2 oz (118.9 kg)  06/04/18 289 lb 14.4 oz (131.5 kg)     GEN:  Well nourished, well developed in no acute distress HEENT: Normal NECK: No JVD; No carotid bruits LYMPHATICS: No lymphadenopathy CARDIAC: RRR, no murmurs, rubs, gallops RESPIRATORY:  Clear to auscultation without rales, wheezing or rhonchi  ABDOMEN: Soft, non-tender, non-distended MUSCULOSKELETAL:  No edema; No deformity  SKIN: Warm and dry NEUROLOGIC:  Alert and oriented x 3 PSYCHIATRIC:  Normal affect   ASSESSMENT:    1. Hyperlipidemia, unspecified hyperlipidemia type   2. ELEVATED BLOOD PRESSURE   3. Type 2 diabetes mellitus with hyperlipidemia ([place])   4. Family history of coronary arteriosclerosis    PLAN:    In order of problems listed above:  Family history of coronary artery disease -I think it makes sense for him to proceed with a calcium score.  If abnormal, we will likely transition him from atorvastatin 20 mg over to  Crestor 20 mg for high intensity statin dose.  Currently he is not having any anginal symptoms.  He denies any significant shortness of breath or chest pain.  Hyperlipidemia -Last LDL 66, excellent on atorvastatin 20 mg.  However, if he does have evidence of calcified plaque or coronary artery disease, we should intensify this with Crestor 20 mg, high intensity dose.  Diabetes with hyperlipidemia -Improved hemoglobin A1c with diet and exercise.  He has lost 40 pounds.  Continue with ambulation, walking.  Addendum: Calcium score was in the 370, 89th percentile range.  I am going to start Crestor 20 mg.  Recheck lipids in 3 months.  Continue with aggressive secondary risk factor prevention.  Also should strongly consider aspirin 81 mg.   Medication Adjustments/Labs and Tests Ordered: Current medicines are reviewed at length with the patient today.  Concerns regarding medicines are outlined above.  Orders Placed This Encounter  Procedures  . CT CARDIAC SCORING  . EKG 12-Lead   No orders of the defined types were placed in this encounter.   Patient Instructions  Medication Instructions:  The current medical regimen is effective;  continue present plan and medications.  *If you need a refill on your cardiac medications before your next appointment, please call your pharmacy*  Testing/Procedures: Your physician has requested that you have Coronary Calcium score which is completed by CT scan.  This is a painless test that uses an x-ray machine to take clear, detailed pictures of your heart. The cost is $150, not covered by insurance and due at the time of the testing.  Follow-Up: At [facility], you and your health needs are our priority.  As part of our continuing mission to provide you with exceptional heart care, we have created designated Provider Care  Teams.  These Care Teams include your primary Cardiologist (physician) and Advanced Practice Providers (APPs -  Physician Assistants  and Nurse Practitioners) who all work together to provide you with the care you need, when you need it.  We recommend signing up for the patient portal called "MyChart".  Sign up information is provided on this After Visit Summary.  MyChart is used to connect with patients for Virtual Visits (Telemedicine).  Patients are able to view lab/test results, encounter notes, upcoming appointments, etc.  Non-urgent messages can be sent to your provider as well.   To learn more about what you can do with MyChart, go to [email or web address].    Follow up as needed with Dr Marlou Porch.  Thank you for choosing [facility]!!        Signed, Candee Furbish, MD  05/03/2020 4:53 PM    [facility]

## 2020-05-03 NOTE — Patient Instructions (Signed)
Medication Instructions:  The current medical regimen is effective;  continue present plan and medications.  *If you need a refill on your cardiac medications before your next appointment, please call your pharmacy*  Testing/Procedures: Your physician has requested that you have Coronary Calcium score which is completed by CT scan.  This is a painless test that uses an x-ray machine to take clear, detailed pictures of your heart. The cost is $150, not covered by insurance and due at the time of the testing.  Follow-Up: At [facility], you and your health needs are our priority.  As part of our continuing mission to provide you with exceptional heart care, we have created designated Provider Care Teams.  These Care Teams include your primary Cardiologist (physician) and Advanced Practice Providers (APPs -  Physician Assistants and Nurse Practitioners) who all work together to provide you with the care you need, when you need it.  We recommend signing up for the patient portal called "MyChart".  Sign up information is provided on this After Visit Summary.  MyChart is used to connect with patients for Virtual Visits (Telemedicine).  Patients are able to view lab/test results, encounter notes, upcoming appointments, etc.  Non-urgent messages can be sent to your provider as well.   To learn more about what you can do with MyChart, go to [email or web address].    Follow up as needed with Dr Marlou Porch.  Thank you for choosing [facility]!!

## 2020-05-04 ENCOUNTER — Telehealth: Payer: Self-pay | Admitting: *Deleted

## 2020-05-04 MED ORDER — ROSUVASTATIN CALCIUM 20 MG PO TABS: 20.0000 mg | ORAL_TABLET | Freq: Every day | ORAL | 3 refills | Status: DC

## 2020-05-04 MED ORDER — ASPIRIN EC 81 MG PO TBEC: 81.0000 mg | DELAYED_RELEASE_TABLET | Freq: Every day | ORAL | 3 refills | Status: DC

## 2020-05-04 NOTE — Telephone Encounter (Signed)
Elevated coronary calcium score 371. 89th percentile   Lets change the atorvastatin 20 mg over to Crestor 20 mg daily. This is a high intensity statin dose. This will be better for overall prevention efforts. I would also encourage 81 mg of aspirin daily.   Repeat lipid panel in 3 months.   Candee Furbish, MD    Left message on VM to c/b to discuss the above.

## 2020-05-04 NOTE — Telephone Encounter (Signed)
Spoke with patient and reviewed results and needed medication changes.  RX for Crestor sent into [facility]	[place] as requested.  Pt has already been taking ASA 81 mg. He will have blood work at his PCP office in January 2022.  Pt is aware to report any new SOB or chest pain/pressure should they occur. He is aware to follow healthy life style habits.  He will c/b if any questions or concerns.

## 2020-06-06 ENCOUNTER — Encounter: Payer: Self-pay | Admitting: Family Medicine

## 2020-06-07 MED ORDER — LORAZEPAM 1 MG PO TABS: ORAL_TABLET | ORAL | 0 refills | Status: DC

## 2020-06-13 ENCOUNTER — Encounter: Payer: Self-pay | Admitting: Family Medicine

## 2020-06-25 ENCOUNTER — Encounter: Payer: Self-pay | Admitting: Podiatry

## 2020-06-25 ENCOUNTER — Ambulatory Visit (INDEPENDENT_AMBULATORY_CARE_PROVIDER_SITE_OTHER): Payer: BC Managed Care – PPO

## 2020-06-25 ENCOUNTER — Ambulatory Visit (INDEPENDENT_AMBULATORY_CARE_PROVIDER_SITE_OTHER): Payer: BC Managed Care – PPO | Admitting: Podiatry

## 2020-06-25 ENCOUNTER — Other Ambulatory Visit: Payer: Self-pay

## 2020-06-25 DIAGNOSIS — G629 Polyneuropathy, unspecified: Secondary | ICD-10-CM | POA: Diagnosis not present

## 2020-06-25 DIAGNOSIS — M779 Enthesopathy, unspecified: Secondary | ICD-10-CM | POA: Diagnosis not present

## 2020-06-25 DIAGNOSIS — G5791 Unspecified mononeuropathy of right lower limb: Secondary | ICD-10-CM | POA: Diagnosis not present

## 2020-06-25 DIAGNOSIS — G5792 Unspecified mononeuropathy of left lower limb: Secondary | ICD-10-CM

## 2020-06-25 MED ORDER — GABAPENTIN 100 MG PO CAPS: 100.0000 mg | ORAL_CAPSULE | Freq: Three times a day (TID) | ORAL | 3 refills | Status: DC

## 2020-06-27 ENCOUNTER — Encounter: Payer: Self-pay | Admitting: Family Medicine

## 2020-06-27 ENCOUNTER — Ambulatory Visit (INDEPENDENT_AMBULATORY_CARE_PROVIDER_SITE_OTHER): Payer: BC Managed Care – PPO | Admitting: Family Medicine

## 2020-06-27 ENCOUNTER — Other Ambulatory Visit: Payer: Self-pay

## 2020-06-27 VITALS — BP 110/78 | HR 93 | Temp 98.3°F | Ht 73.5 in | Wt 253.3 lb

## 2020-06-27 DIAGNOSIS — F5104 Psychophysiologic insomnia: Secondary | ICD-10-CM

## 2020-06-27 DIAGNOSIS — E785 Hyperlipidemia, unspecified: Secondary | ICD-10-CM | POA: Diagnosis not present

## 2020-06-27 DIAGNOSIS — E119 Type 2 diabetes mellitus without complications: Secondary | ICD-10-CM

## 2020-06-27 LAB — POCT GLYCOSYLATED HEMOGLOBIN (HGB A1C): Hemoglobin A1C: 6.1 % — AB (ref 4.0–5.6)

## 2020-06-27 MED ORDER — ESZOPICLONE 2 MG PO TABS: 2.0000 mg | ORAL_TABLET | Freq: Every evening | ORAL | 0 refills | Status: DC | PRN

## 2020-06-27 NOTE — Progress Notes (Signed)
Established Patient Office Visit  Subjective:  Patient ID: [name], male    DOB: 1961/06/25  Age: 59 y.o. MRN: [id number]  CC:  Chief Complaint  Patient presents with  . Diabetes    HPI [name] presents for medical follow-up  Type 2 diabetes.  History of good control.  Last A1c 6.1%.  He takes extended release Metformin and Jardiance.  No significant polyuria or polydipsia.  He has history of hyperlipidemia.  Had been on Lipitor for several years.  Was recently switched by cardiology to Crestor 20 mg daily after having high coronary calcium score.  He had coronary calcium score of 371 which is 89th percentile for age and sex matched control.  He is a non-smoker.  He takes aspirin regularly.  No recent chest pains.  Has not had follow-up lipids since switching over from atorvastatin to rosuvastatin.  Chronic insomnia.  He has difficulty sometimes falling asleep and also staying asleep.  He has taken Benadryl up to 75 mg without much success and has taken melatonin up to 30 mg without relief.  Has occasionally taken lorazepam in the past.  Avoids late the use of caffeine.  No regular alcohol use.  Past Medical History:  Diagnosis Date  . Arthritis   . BURSITIS, LEFT HIP 05/20/2010  . Cancer [facility]) 2010   pancreatic cancer  . Chronic kidney disease    stones; 13 stones, currently has 2 stones   . Claustrophobia    "take Lorazepam if I have to fly"  . DIAB W/O COMP TYPE II/UNS NOT STATED UNCNTRL 03/30/2009  . ELEVATED BLOOD PRESSURE 03/30/2009  . Fissure, anal    occ bleeds still  . Headache(784.0)    "only when I was working; had headaches and migraines"  . Hyperlipidemia 10/11/2010  . SHOULDER PAIN, LEFT 10/20/2008    Past Surgical History:  Procedure Laterality Date  . ANAL FISSURE REPAIR  07/28/2011   Procedure: ANAL FISSURE REPAIR;  Surgeon: Adin Hector, MD;  Location: [facility];  Service: General;  Laterality: N/A;  Repair Anal Fissure/sphinterotomy and excision  rectal polypl  . BACK SURGERY  2000   "had 2 cages put in"  . BIOPSY PANCREAS  2010   benign  . HERNIA REPAIR  5465   umbilical  . KIDNEY STONES  2010 & 2008 & 2005  . LAPAROSCOPIC INCISIONAL / UMBILICAL / VENTRAL HERNIA REPAIR  07/08/11   V[facility]  . left rotator cuff  02/26/2011  . left shoulder bone spurs  2011  . LIPOMA EXCISION     "several; off back, arms"  . pancreatic tumor  2010  . right rotator cuff     2011  . TONSILLECTOMY  1967  . TOTAL KNEE ARTHROPLASTY Left 04/28/2017   Procedure: LEFT TOTAL KNEE ARTHROPLASTY;  Surgeon: Paralee Cancel, MD;  Location: [facility];  Service: Orthopedics;  Laterality: Left;  Marland Kitchen VASECTOMY  1994  . VENTRAL HERNIA REPAIR  07/08/2011   Procedure: LAPAROSCOPIC VENTRAL HERNIA;  Surgeon: Adin Hector, MD;  Location: [place];  Service: General;  Laterality: N/A;  Laparoscopic repair ventral hernia with mesh, Lysis of adhesions.    Family History  Problem Relation Age of Onset  . Diabetes Brother   . Stroke Father   . Heart disease Father   . Heart attack Father 32  . Cancer Maternal Grandfather        colon  . Colon cancer Maternal Grandfather 83    Social History  Socioeconomic History  . Marital status: Married    Spouse name: Not on file  . Number of children: 1  . Years of education: Not on file  . Highest education level: Not on file  Occupational History  . Occupation: Retired-part time Barrister's clerk  Tobacco Use  . Smoking status: Never Smoker  . Smokeless tobacco: Never Used  Vaping Use  . Vaping Use: Never used  Substance and Sexual Activity  . Alcohol use: No    Comment: 07/08/11 "maybe 2-3 times a year"  . Drug use: No  . Sexual activity: Yes  Other Topics Concern  . Not on file  Social History Narrative  . Not on file   Social Determinants of Health   Financial Resource Strain:   . Difficulty of Paying Living Expenses: Not on file  Food Insecurity:   . Worried About Charity fundraiser in the Last Year: Not on file    . Ran Out of Food in the Last Year: Not on file  Transportation Needs:   . Lack of Transportation (Medical): Not on file  . Lack of Transportation (Non-Medical): Not on file  Physical Activity:   . Days of Exercise per Week: Not on file  . Minutes of Exercise per Session: Not on file  Stress:   . Feeling of Stress : Not on file  Social Connections:   . Frequency of Communication with Friends and Family: Not on file  . Frequency of Social Gatherings with Friends and Family: Not on file  . Attends Religious Services: Not on file  . Active Member of Clubs or Organizations: Not on file  . Attends Archivist Meetings: Not on file  . Marital Status: Not on file  Intimate Partner Violence:   . Fear of Current or Ex-Partner: Not on file  . Emotionally Abused: Not on file  . Physically Abused: Not on file  . Sexually Abused: Not on file    Outpatient Medications Prior to Visit  Medication Sig Dispense Refill  . amoxicillin (AMOXIL) 500 MG capsule Take 4 capsules (2,000 mg) 1 hour before dental cleaning. 12 capsule 0  . aspirin EC 81 MG tablet Take 1 tablet (81 mg total) by mouth daily. Swallow whole. 90 tablet 3  . celecoxib (CELEBREX) 200 MG capsule Take 1 capsule (200 mg total) by mouth 2 (two) times daily. 180 capsule 3  . cetirizine (ZYRTEC) 10 MG tablet Take 10 mg by mouth as needed.     . cyclobenzaprine (FLEXERIL) 10 MG tablet TAKE ONE TABLET BY MOUTH EVERY 8 HOURS AS NEEDED FOR MUSCLE SPASM 60 tablet 3  . diphenhydrAMINE (BENADRYL) 50 MG capsule Take 50 mg by mouth every 6 (six) hours as needed.    . empagliflozin (JARDIANCE) 10 MG TABS tablet Take 10 mg by mouth daily. 90 tablet 3  . gabapentin (NEURONTIN) 100 MG capsule Take 1 capsule (100 mg total) by mouth 3 (three) times daily. 90 capsule 3  . glucose blood (CONTOUR NEXT TEST) test strip Use twice daily for glucose control.  Dx E11.9 200 each 3  . Lancet Device MISC Contour next test lancets.  Test once daily.  Dx  E11.9 100 each 3  . LORazepam (ATIVAN) 1 MG tablet TAKE ONE TABLET BY MOUTH TWICE A DAY AS NEEDED FOR ANXIETY 30 tablet 0  . metFORMIN (GLUCOPHAGE-XR) 500 MG 24 hr tablet TAKE 2 TABLETS BY MOUTH twice daily. 360 tablet 3  . Multiple Vitamins-Minerals (CENTRUM PO) Take 1 tablet  by mouth daily.     Marland Kitchen OVER THE COUNTER MEDICATION Melatonin 15 mg at bedtime    . potassium citrate (UROCIT-K) 10 MEQ (1080 MG) SR tablet TAKE 1 TABLET BY MOUTH TWO TIMES A DAY... 180 tablet 3  . rosuvastatin (CRESTOR) 20 MG tablet Take 1 tablet (20 mg total) by mouth daily. 90 tablet 3   No facility-administered medications prior to visit.    Allergies  Allergen Reactions  . Merthiolate [Thimerosol]     Unknown reaction  . Morphine     REACTION: paronia  . Nickel Dermatitis    ROS Review of Systems  Constitutional: Negative for fatigue and unexpected weight change.  Eyes: Negative for visual disturbance.  Respiratory: Negative for cough, chest tightness and shortness of breath.   Cardiovascular: Negative for chest pain, palpitations and leg swelling.  Endocrine: Negative for polydipsia and polyuria.  Neurological: Negative for dizziness, syncope, weakness, light-headedness and headaches.      Objective:    Physical Exam Constitutional:      Appearance: He is well-developed.  HENT:     Right Ear: External ear normal.     Left Ear: External ear normal.  Eyes:     Pupils: Pupils are equal, round, and reactive to light.  Neck:     Thyroid: No thyromegaly.  Cardiovascular:     Rate and Rhythm: Normal rate and regular rhythm.  Pulmonary:     Effort: Pulmonary effort is normal. No respiratory distress.     Breath sounds: Normal breath sounds. No wheezing or rales.  Musculoskeletal:     Cervical back: Neck supple.  Neurological:     Mental Status: He is alert and oriented to person, place, and time.     BP 110/78 (BP Location: Left Arm, Cuff Size: Normal)   Pulse 93   Temp 98.3 F (36.8 C)  (Oral)   Ht 6' 1.5" (1.867 m)   Wt 253 lb 4.8 oz (114.9 kg)   SpO2 93%   BMI 32.97 kg/m  Wt Readings from Last 3 Encounters:  06/27/20 253 lb 4.8 oz (114.9 kg)  05/03/20 262 lb (118.8 kg)  08/08/19 262 lb 3.2 oz (118.9 kg)     Health Maintenance Due  Topic Date Due  . HIV Screening  Never done  . FOOT EXAM  05/06/2014  . URINE MICROALBUMIN  03/23/2018    There are no preventive care reminders to display for this patient.  Lab Results  Component Value Date   TSH 1.96 08/08/2019   Lab Results  Component Value Date   WBC 7.9 08/08/2019   HGB 17.7 (H) 08/08/2019   HCT 52.1 (H) 08/08/2019   MCV 90.5 08/08/2019   PLT 240.0 08/08/2019   Lab Results  Component Value Date   NA 142 08/08/2019   K 4.8 08/08/2019   CO2 28 08/08/2019   GLUCOSE 112 (H) 08/08/2019   BUN 16 08/08/2019   CREATININE 0.84 08/08/2019   BILITOT 1.2 08/08/2019   ALKPHOS 58 08/08/2019   AST 27 08/08/2019   ALT 18 08/08/2019   PROT 7.6 08/08/2019   ALBUMIN 4.9 08/08/2019   CALCIUM 10.1 08/08/2019   ANIONGAP 9 04/29/2017   GFR 93.65 08/08/2019   Lab Results  Component Value Date   CHOL 130 01/05/2019   Lab Results  Component Value Date   HDL 40.00 01/05/2019   Lab Results  Component Value Date   LDLCALC 86 08/20/2012   Lab Results  Component Value Date   TRIG 227.0 (  H) 01/05/2019   Lab Results  Component Value Date   CHOLHDL 3 01/05/2019   Lab Results  Component Value Date   HGBA1C 6.1 (A) 06/27/2020      Assessment & Plan:   #1 type 2 diabetes well controlled with A1c 6.1%  -Continue current medication regimen -Continue with yearly eye exam  #2 dyslipidemia.  Recent elevated coronary calcium score as above.  Recent transition from Lipitor 20 mg daily to Crestor 20 mg daily -Place order for future fasting lab for hepatic and lipid panel and he will schedule next week  #3 chronic insomnia -Sleep hygiene discussed -Consider trial of Lunesta 2 mg nightly as he has tried  multiple other things without relief previously  Meds ordered this encounter  Medications  . eszopiclone (LUNESTA) 2 MG TABS tablet    Sig: Take 1 tablet (2 mg total) by mouth at bedtime as needed for sleep. Take immediately before bedtime    Dispense:  30 tablet    Refill:  0    Follow-up: No follow-ups on file.    Carolann Littler, MD

## 2020-06-27 NOTE — Patient Instructions (Signed)

## 2020-07-02 ENCOUNTER — Encounter (HOSPITAL_COMMUNITY): Payer: Self-pay | Admitting: Emergency Medicine

## 2020-07-02 ENCOUNTER — Emergency Department (HOSPITAL_COMMUNITY): Payer: BC Managed Care – PPO

## 2020-07-02 ENCOUNTER — Other Ambulatory Visit: Payer: Self-pay

## 2020-07-02 ENCOUNTER — Emergency Department (HOSPITAL_COMMUNITY)
Admission: EM | Admit: 2020-07-02 | Discharge: 2020-07-03 | Disposition: A | Discharge status: Home / Self Care | Payer: BC Managed Care – PPO | Attending: Emergency Medicine | Admitting: Emergency Medicine

## 2020-07-02 DIAGNOSIS — Z7984 Long term (current) use of oral hypoglycemic drugs: Secondary | ICD-10-CM | POA: Insufficient documentation

## 2020-07-02 DIAGNOSIS — Z8507 Personal history of malignant neoplasm of pancreas: Secondary | ICD-10-CM | POA: Diagnosis not present

## 2020-07-02 DIAGNOSIS — M545 Low back pain, unspecified: Secondary | ICD-10-CM | POA: Insufficient documentation

## 2020-07-02 DIAGNOSIS — Z20822 Contact with and (suspected) exposure to covid-19: Secondary | ICD-10-CM | POA: Insufficient documentation

## 2020-07-02 DIAGNOSIS — N132 Hydronephrosis with renal and ureteral calculous obstruction: Secondary | ICD-10-CM | POA: Diagnosis not present

## 2020-07-02 DIAGNOSIS — R10819 Abdominal tenderness, unspecified site: Secondary | ICD-10-CM | POA: Insufficient documentation

## 2020-07-02 DIAGNOSIS — Z96652 Presence of left artificial knee joint: Secondary | ICD-10-CM | POA: Diagnosis not present

## 2020-07-02 DIAGNOSIS — N139 Obstructive and reflux uropathy, unspecified: Secondary | ICD-10-CM

## 2020-07-02 DIAGNOSIS — K5790 Diverticulosis of intestine, part unspecified, without perforation or abscess without bleeding: Secondary | ICD-10-CM | POA: Diagnosis not present

## 2020-07-02 DIAGNOSIS — R109 Unspecified abdominal pain: Secondary | ICD-10-CM | POA: Diagnosis not present

## 2020-07-02 DIAGNOSIS — Z7982 Long term (current) use of aspirin: Secondary | ICD-10-CM | POA: Insufficient documentation

## 2020-07-02 DIAGNOSIS — K439 Ventral hernia without obstruction or gangrene: Secondary | ICD-10-CM | POA: Diagnosis not present

## 2020-07-02 DIAGNOSIS — E119 Type 2 diabetes mellitus without complications: Secondary | ICD-10-CM | POA: Diagnosis not present

## 2020-07-02 DIAGNOSIS — N281 Cyst of kidney, acquired: Secondary | ICD-10-CM | POA: Diagnosis not present

## 2020-07-02 LAB — BASIC METABOLIC PANEL
Anion gap: 14 (ref 5–15)
BUN: 24 mg/dL — ABNORMAL HIGH (ref 6–20)
CO2: 19 mmol/L — ABNORMAL LOW (ref 22–32)
Calcium: 9.4 mg/dL (ref 8.9–10.3)
Chloride: 106 mmol/L (ref 98–111)
Creatinine, Ser: 1.09 mg/dL (ref 0.61–1.24)
GFR, Estimated: >60 mL/min (ref >60)
Glucose, Bld: 154 mg/dL — ABNORMAL HIGH (ref 70–99)
Potassium: 4.1 mmol/L (ref 3.5–5.1)
Sodium: 139 mmol/L (ref 135–145)

## 2020-07-02 LAB — CBC
HCT: 46.2 % (ref 39.0–52.0)
Hemoglobin: 16.1 g/dL (ref 13.0–17.0)
MCH: 30.7 pg (ref 26.0–34.0)
MCHC: 34.8 g/dL (ref 30.0–36.0)
MCV: 88.2 fL (ref 80.0–100.0)
Platelets: 255 10*3/uL (ref 150–400)
RBC: 5.24 MIL/uL (ref 4.22–5.81)
RDW: 12.4 % (ref 11.5–15.5)
WBC: 15.9 10*3/uL — ABNORMAL HIGH (ref 4.0–10.5)
nRBC: 0 % (ref 0.0–0.2)

## 2020-07-02 LAB — URINALYSIS, ROUTINE W REFLEX MICROSCOPIC
Bacteria, UA: NONE SEEN
Bilirubin Urine: NEGATIVE
Glucose, UA: >=500 mg/dL — AB
Ketones, ur: 5 mg/dL — AB
Leukocytes,Ua: NEGATIVE
Nitrite: NEGATIVE
Protein, ur: NEGATIVE mg/dL
Specific Gravity, Urine: 1.024 (ref 1.005–1.030)
pH: 6 (ref 5.0–8.0)

## 2020-07-02 MED ORDER — SODIUM CHLORIDE 0.9 % IV BOLUS
1000.0000 mL | Freq: Once | INTRAVENOUS | Status: AC
  Administered 2020-07-02: 1000 mL via INTRAVENOUS

## 2020-07-02 MED ORDER — KETOROLAC TROMETHAMINE 30 MG/ML IJ SOLN
30.0000 mg | Freq: Once | INTRAMUSCULAR | Status: AC
  Administered 2020-07-02: 30 mg via INTRAVENOUS
  Filled 2020-07-02: qty 1

## 2020-07-02 MED ORDER — ONDANSETRON HCL 4 MG/2ML IJ SOLN
4.0000 mg | Freq: Once | INTRAMUSCULAR | Status: AC
  Administered 2020-07-02: 4 mg via INTRAVENOUS
  Filled 2020-07-02: qty 2

## 2020-07-02 MED ORDER — HYDROMORPHONE HCL 1 MG/ML IJ SOLN
1.0000 mg | Freq: Once | INTRAMUSCULAR | Status: AC
  Administered 2020-07-02: 1 mg via INTRAVENOUS
  Filled 2020-07-02: qty 1

## 2020-07-02 NOTE — ED Triage Notes (Signed)
Patient is complaining of left lower back pain that started two hours ago. Patient states that he has a hx of kidney stones. Patient has not taking anything for it.

## 2020-07-02 NOTE — ED Provider Notes (Signed)
[facility] DEPT Provider Note   CSN: [id number] Arrival date & time: 07/02/20  3500     History Chief Complaint  Patient presents with  . Back Pain    [name] is a 59 y.o. male with history of kidney stones, anal fissure, HLD, pancreatic cancer, left hip bursitis who presents the emergency department with a chief complaint of back pain.  The patient endorses sudden onset left lower back and left flank pain that began at approximately 18:15.  He rates the pain as 10 out of 10, constant, and radiating down into the suprapubic region.  He states that the pain feels similar to previous kidney stones, but has not passed a kidney stone since 2010.  Pain is worse with movement.  He is unable to get comfortable.  No other known aggravating or alleviating factors.  He reports associated nausea and retching, but no vomiting.  No fever, chills, dysuria, hematuria, diarrhea, constipation.  No chest pains, shortness of breath, or URI.  The history is provided by the patient and medical records. No language interpreter was used.       Past Medical History:  Diagnosis Date  . Arthritis   . BURSITIS, LEFT HIP 05/20/2010  . Cancer [facility]) 2010   pancreatic cancer  . Chronic kidney disease    stones; 13 stones, currently has 2 stones   . Claustrophobia    "take Lorazepam if I have to fly"  . DIAB W/O COMP TYPE II/UNS NOT STATED UNCNTRL 03/30/2009  . ELEVATED BLOOD PRESSURE 03/30/2009  . Fissure, anal    occ bleeds still  . Headache(784.0)    "only when I was working; had headaches and migraines"  . Hyperlipidemia 10/11/2010  . SHOULDER PAIN, LEFT 10/20/2008    Patient Active Problem List   Diagnosis Date Noted  . S/P left TKA 04/28/2017  . S/P total knee replacement 04/28/2017  . Situational anxiety 03/17/2016  . Low testosterone 03/13/2015  . Osteoarthritis, multiple sites 08/22/2013  . Obesity (BMI 30-39.9) 05/08/2013  . Primary pancreatic  neuroendocrine tumor 10/07/2012  . Liver nodule 10/07/2012  . History of kidney stones 08/27/2012  . 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 Rotator cuff tear, left 01/23/2011  . Hyperlipidemia 10/11/2010  . BURSITIS, LEFT HIP 05/20/2010  . Controlled type 2 diabetes mellitus without complication ([place]) 93/81/8299  . CHRONIC TENSION TYPE HEADACHE 03/30/2009  . ELEVATED BLOOD PRESSURE 03/30/2009  . SHOULDER PAIN, LEFT 10/20/2008  . CONTUSION OF KNEE 10/20/2008  . ABNORMAL FINDINGS GI TRACT 06/23/2008    Past Surgical History:  Procedure Laterality Date  . ANAL FISSURE REPAIR  07/28/2011   Procedure: ANAL FISSURE REPAIR;  Surgeon: Adin Hector, MD;  Location: [facility]S;  Service: General;  Laterality: N/A;  Repair Anal Fissure/sphinterotomy and excision rectal polypl  . BACK SURGERY  2000   "had 2 cages put in"  . BIOPSY PANCREAS  2010   benign  . HERNIA REPAIR  3716   umbilical  . KIDNEY STONES  2010 & 2008 & 2005  . LAPAROSCOPIC INCISIONAL / UMBILICAL / VENTRAL HERNIA REPAIR  07/08/11   [facility]  . left rotator cuff  02/26/2011  . left shoulder bone spurs  2011  . LIPOMA EXCISION     "several; off back, arms"  . pancreatic tumor  2010  . right rotator cuff     2011  . TONSILLECTOMY  1967  . TOTAL KNEE ARTHROPLASTY Left 04/28/2017   Procedure: LEFT TOTAL KNEE ARTHROPLASTY;  Surgeon: Paralee Cancel, MD;  Location: [facility];  Service: Orthopedics;  Laterality: Left;  Marland Kitchen VASECTOMY  1994  . VENTRAL HERNIA REPAIR  07/08/2011   Procedure: LAPAROSCOPIC VENTRAL HERNIA;  Surgeon: Adin Hector, MD;  Location: [facility];  Service: General;  Laterality: N/A;  Laparoscopic repair ventral hernia with mesh, Lysis of adhesions.       Family History  Problem Relation Age of Onset  . Diabetes Brother   . Stroke Father   . Heart disease Father   . Heart attack Father 31  . Cancer Maternal Grandfather        colon  . Colon cancer Maternal Grandfather 76    Social History   Tobacco Use  . Smoking  status: Never Smoker  . Smokeless tobacco: Never Used  Vaping Use  . Vaping Use: Never used  Substance Use Topics  . Alcohol use: No    Comment: 07/08/11 "maybe 2-3 times a year"  . Drug use: No    Home Medications Prior to Admission medications   Medication Sig Start Date End Date Taking? Authorizing Provider  amoxicillin (AMOXIL) 500 MG capsule Take 4 capsules (2,000 mg) 1 hour before dental cleaning. 03/14/19   Burchette, Alinda Sierras, MD  aspirin EC 81 MG tablet Take 1 tablet (81 mg total) by mouth daily. Swallow whole. 05/04/20   Jerline Pain, MD  celecoxib (CELEBREX) 200 MG capsule Take 1 capsule (200 mg total) by mouth 2 (two) times daily. 08/08/19   Burchette, Alinda Sierras, MD  cetirizine (ZYRTEC) 10 MG tablet Take 10 mg by mouth as needed.     [provider]  cyclobenzaprine (FLEXERIL) 10 MG tablet TAKE ONE TABLET BY MOUTH EVERY 8 HOURS AS NEEDED FOR MUSCLE SPASM 03/30/20   Burchette, Alinda Sierras, MD  diphenhydrAMINE (BENADRYL) 50 MG capsule Take 50 mg by mouth every 6 (six) hours as needed.    [provider]  empagliflozin (JARDIANCE) 10 MG TABS tablet Take 10 mg by mouth daily. 08/08/19   Burchette, Alinda Sierras, MD  eszopiclone (LUNESTA) 2 MG TABS tablet Take 1 tablet (2 mg total) by mouth at bedtime as needed for sleep. Take immediately before bedtime 06/27/20   Burchette, Alinda Sierras, MD  gabapentin (NEURONTIN) 100 MG capsule Take 1 capsule (100 mg total) by mouth 3 (three) times daily. 06/25/20   Wallene Huh, DPM  glucose blood (CONTOUR NEXT TEST) test strip Use twice daily for glucose control.  Dx E11.9 05/04/17   [name], MD  Lancet Device MISC Contour next test lancets.  Test once daily.  Dx E11.9 04/08/17   Burchette, Alinda Sierras, MD  LORazepam (ATIVAN) 1 MG tablet TAKE ONE TABLET BY MOUTH TWICE A DAY AS NEEDED FOR ANXIETY 06/07/20   Burchette, Alinda Sierras, MD  metFORMIN (GLUCOPHAGE-XR) 500 MG 24 hr tablet TAKE 2 TABLETS BY MOUTH twice daily. 08/08/19   Burchette, Alinda Sierras, MD   Multiple Vitamins-Minerals (CENTRUM PO) Take 1 tablet by mouth daily.     [provider]  OVER THE COUNTER MEDICATION Melatonin 15 mg at bedtime    [provider]  potassium citrate (UROCIT-K) 10 MEQ (1080 MG) SR tablet TAKE 1 TABLET BY MOUTH TWO TIMES A DAY... 08/08/19   Burchette, Alinda Sierras, MD  rosuvastatin (CRESTOR) 20 MG tablet Take 1 tablet (20 mg total) by mouth daily. 05/04/20   Jerline Pain, MD    Allergies    Merthiolate [thimerosol], Morphine, and Nickel  Review of Systems  Review of Systems  Constitutional: Negative for appetite change, chills and fever.  HENT: Negative for congestion.   Respiratory: Negative for shortness of breath and wheezing.   Cardiovascular: Negative for chest pain and palpitations.  Gastrointestinal: Negative for abdominal pain, diarrhea, nausea and vomiting.  Genitourinary: Positive for flank pain. Negative for decreased urine volume, dysuria, genital sores, hematuria, penile swelling, scrotal swelling and urgency.  Musculoskeletal: Positive for back pain. Negative for myalgias, neck pain and neck stiffness.  Skin: Negative for rash.  Allergic/Immunologic: Negative for immunocompromised state.  Neurological: Negative for dizziness, seizures, syncope, weakness, numbness and headaches.  Psychiatric/Behavioral: Negative for confusion.    Physical Exam Updated Vital Signs BP (!) 149/88   Pulse 92   Temp 97.9 F (36.6 C) (Oral)   Resp 16   Ht 6\' 2"  (1.88 m)   Wt 115.2 kg   SpO2 99%   BMI 32.61 kg/m   Physical Exam Vitals and nursing note reviewed.  Constitutional:      General: He is in acute distress.     Appearance: He is well-developed. He is not ill-appearing, toxic-appearing or diaphoretic.     Comments: Writhing around on the bed.  HENT:     Head: Normocephalic.     Mouth/Throat:     Mouth: Mucous membranes are moist.  Eyes:     Conjunctiva/sclera: Conjunctivae normal.  Cardiovascular:     Rate and Rhythm:  Normal rate and regular rhythm.     Heart sounds: No murmur heard.   Pulmonary:     Effort: Pulmonary effort is normal. No respiratory distress.     Breath sounds: No stridor. No wheezing, rhonchi or rales.  Chest:     Chest wall: No tenderness.  Abdominal:     General: There is no distension.     Palpations: Abdomen is soft. There is no mass.     Tenderness: There is abdominal tenderness. There is left CVA tenderness. There is no right CVA tenderness, guarding or rebound.     Hernia: No hernia is present.     Comments: There is left CVA tenderness.  Abdomen is soft and nondistended.  Musculoskeletal:     Cervical back: Neck supple.     Right lower leg: No edema.     Left lower leg: No edema.  Skin:    General: Skin is warm and dry.     Capillary Refill: Capillary refill takes less than 2 seconds.     Coloration: Skin is not jaundiced or pale.     Findings: No erythema.  Neurological:     Mental Status: He is alert.  Psychiatric:        Behavior: Behavior normal.     ED Results / Procedures / Treatments   Labs (all labs ordered are listed, but only abnormal results are displayed) Labs Reviewed  BASIC METABOLIC PANEL - Abnormal; Notable for the following components:      Result Value   CO2 19 (*)    Glucose, Bld 154 (*)    BUN 24 (*)    All other components within normal limits  CBC - Abnormal; Notable for the following components:   WBC 15.9 (*)    All other components within normal limits  URINALYSIS, ROUTINE W REFLEX MICROSCOPIC    EKG None  Radiology CT Renal Stone Study  Result Date: 07/02/2020 CLINICAL DATA:  Left-sided flank pain for 2 hours EXAM: CT ABDOMEN AND PELVIS WITHOUT CONTRAST TECHNIQUE: Multidetector CT imaging of the abdomen and pelvis  was performed following the standard protocol without IV contrast. COMPARISON:  10/04/2012 FINDINGS: Lower chest: Lung bases are free of acute infiltrate or sizable effusion. Hepatobiliary: No focal liver  abnormality is seen. No gallstones, gallbladder wall thickening, or biliary dilatation. Pancreas: Unremarkable. No pancreatic ductal dilatation or surrounding inflammatory changes. Spleen: Normal in size without focal abnormality. Adrenals/Urinary Tract: Adrenal glands are within normal limits with the exception of a small nodule on the left adrenal measuring 12 mm likely representing a small adenoma. The kidneys demonstrate left-sided hydronephrosis and hydroureter which extends inferiorly to an obstructing stone which measures approximately 10 mm in greatest dimension. The more distal left ureter is within normal limits. No obstructive changes are noted on the right. Bilateral nonobstructing stones are seen. The largest of these on the left measures approximately 11 mm. The largest of these on the right measures 10 mm. Small cyst is noted in the upper pole of the right kidney. The bladder is partially distended. Stomach/Bowel: Scattered diverticular change of the colon is noted. No evidence of diverticulitis is seen. No obstructive or inflammatory changes are noted. The appendix is well visualized and within normal limits. No obstructive or inflammatory changes of the small bowel are seen. The stomach is filled with ingested food stuffs. Vascular/Lymphatic: Aortic atherosclerosis. No enlarged abdominal or pelvic lymph nodes. Reproductive: Prostate is unremarkable. Other: No free fluid is noted. There is a fat containing anterior abdominal wall hernia similar to that seen on the prior exam. The mouth measures approximately 5.6 cm in greatest dimension. No bowel is noted within. Musculoskeletal: No acute or significant osseous findings. Postsurgical changes are noted at L5-S1. IMPRESSION: Left mid ureteral stone measuring 10 mm with left-sided hydronephrosis and hydroureter. Bilateral nonobstructing renal calculi as described. Small adrenal nodule likely representing an adenoma. Diverticulosis without  diverticulitis. Fat containing anterior abdominal wall hernia. Electronically Signed   By: Inez Catalina M.D.   On: 07/02/2020 21:38    Procedures Procedures (including critical care time)  Medications Ordered in ED Medications  ondansetron (ZOFRAN) injection 4 mg (4 mg Intravenous Given 07/02/20 2223)  HYDROmorphone (DILAUDID) injection 1 mg (1 mg Intravenous Given 07/02/20 2222)  ketorolac (TORADOL) 30 MG/ML injection 30 mg (30 mg Intravenous Given 07/02/20 2222)  sodium chloride 0.9 % bolus 1,000 mL (1,000 mLs Intravenous New Bag/Given 07/02/20 2227)    ED Course  I have reviewed the triage vital signs and the nursing notes.  Pertinent labs & imaging results that were available during my care of the patient were reviewed by me and considered in my medical decision making (see chart for details).  Clinical Course as of Jul 03 118  Tue Jul 03, 2020  0110 Update from RN.  Patient is feeling significantly improved and is ready for discharge to home.   [MM]    Clinical Course User Index [MM] Jamine Wingate, Laymond Purser, PA-C   MDM Rules/Calculators/A&P                          59 year old male with history of kidney stones, anal fissure, HLD, pancreatic cancer, left hip bursitis presenting with sudden onset left flank and left low back pain, earlier tonight.  Pain feels similar to previous kidney stones.  Vitals are stable.  Labs have been reviewed and independently interpreted by me.  He has a leukocytosis of 15.9.  Bicarb is 19. UA with glucosuria, related to Jardiance on his medication list.  Imaging has been reviewed and independently interpreted  by me.  There is an obstructed 10 mm stone in the left ureter with associated hydronephrosis and hydroureter.   We will order Dilaudid, Toradol, and IV fluids given decreased bicarb.   On initial reevaluation, patient was no longer writhing on the bed.  He no longer appeared in acute distress.  However, he continued to endorse severe nausea and  pain.  He was treated with Reglan and Percocet.  Following this regimen, he was significantly improved.  He feels ready to be discharged home.  I suspect leukocytosis is reactive.  He is having no constitutional symptoms and urine does not appear infectious.  Doubt urosepsis, pyelonephritis, diverticulitis, appendicitis, cholecystitis.  He is established with [facility] and plans to call the office tomorrow for follow-up.  He has been given a prescription for Flomax, Reglan, and Roxicodone.  ER return precautions discussed.  He is hemodynamically stable and in no acute distress.  Safe for discharge home with outpatient follow-up as indicated.  Final Clinical Impression(s) / ED Diagnoses Final diagnoses:  None    Rx / DC Orders ED Discharge Orders    None       Joanne Gavel, PA-C 07/03/20 0121    Charlesetta Shanks, MD 07/13/20 1642

## 2020-07-02 NOTE — ED Notes (Signed)
Pt aware urine sample needed 

## 2020-07-02 NOTE — Progress Notes (Signed)
Subjective:   Patient ID: [name], male   DOB: 59 y.o.   MRN: [id number]   HPI patient presents stating he was concerned about tingling in his feet and also his feet get sore if he is on them for too long of the time.  States that he has had a lot of back problems over the years and has had surgery several times.  Patient does not smoke likes to be active advised   Review of Systems  All other systems reviewed and are negative.       Objective:  Physical Exam Vitals and nursing note reviewed.  Constitutional:      Appearance: He is well-developed.  Pulmonary:     Effort: Pulmonary effort is normal.  Musculoskeletal:        General: Normal range of motion.  Skin:    General: Skin is warm.  Neurological:     Mental Status: He is alert.     Neurovascular status was found indicate pulses PT DP within normal limits moderate diminishment of both sharp dull and vibratory bilateral with muscle strength is adequate and range of motion adequate subtalar midtarsal joint.  He does have diabetes which appears to be well controlled and moderately obese and does have discomfort when I pressed into the plantar feet but no indications of specific areas that are intensely discomforting.  Moderate depression of the arch and patient did have good digital perfusion well oriented x3     Assessment:  Probability that this is related to long-term back issues with low-grade type neuropathic condition and also history of diabetes with possible tendinitis      Plan:  H&P conditions reviewed and I have recommended anti-inflammatories topical medicine and good support shoes with cushion.  I discussed possibility for gabapentin and he denies wanting this at the current time  X-rays indicate that there is no signs of stress fracture no signs of arthritis bilateral

## 2020-07-03 ENCOUNTER — Other Ambulatory Visit: Payer: Self-pay | Admitting: Urology

## 2020-07-03 ENCOUNTER — Other Ambulatory Visit (HOSPITAL_COMMUNITY)
Admission: RE | Admit: 2020-07-03 | Discharge: 2020-07-03 | Disposition: A | Discharge status: Home / Self Care | Payer: BC Managed Care – PPO | Source: Ambulatory Visit | Attending: Urology | Admitting: Urology

## 2020-07-03 ENCOUNTER — Other Ambulatory Visit: Payer: Self-pay

## 2020-07-03 ENCOUNTER — Encounter (HOSPITAL_COMMUNITY): Payer: Self-pay | Admitting: Urology

## 2020-07-03 DIAGNOSIS — N202 Calculus of kidney with calculus of ureter: Secondary | ICD-10-CM | POA: Diagnosis not present

## 2020-07-03 LAB — SARS CORONAVIRUS 2 (TAT 6-24 HRS): SARS Coronavirus 2: NEGATIVE

## 2020-07-03 MED ORDER — TAMSULOSIN HCL 0.4 MG PO CAPS: 0.4000 mg | ORAL_CAPSULE | Freq: Every day | ORAL | 0 refills | Status: DC

## 2020-07-03 MED ORDER — METOCLOPRAMIDE HCL 5 MG/ML IJ SOLN
10.0000 mg | Freq: Once | INTRAMUSCULAR | Status: AC
  Administered 2020-07-03: 10 mg via INTRAVENOUS
  Filled 2020-07-03: qty 2

## 2020-07-03 MED ORDER — OXYCODONE HCL 5 MG PO TABS
5.0000 mg | ORAL_TABLET | Freq: Four times a day (QID) | ORAL | 0 refills | Status: DC | PRN
Start: 2020-07-03 — End: 2020-07-04

## 2020-07-03 MED ORDER — OXYCODONE-ACETAMINOPHEN 5-325 MG PO TABS
1.0000 | ORAL_TABLET | Freq: Once | ORAL | Status: AC
  Administered 2020-07-03: 1 via ORAL
  Filled 2020-07-03: qty 1

## 2020-07-03 MED ORDER — METOCLOPRAMIDE HCL 10 MG PO TABS: 10.0000 mg | ORAL_TABLET | Freq: Four times a day (QID) | ORAL | 0 refills | Status: DC

## 2020-07-03 NOTE — Progress Notes (Addendum)
COVID Vaccine Completed: Yes  Date COVID Vaccine completed: 05/05/20, 10/28/19, 09/29/19 COVID vaccine manufacturer: Pf[employer]     PCP - [name], MD last office visit 06/27/20 in epic Cardiologist - Candee Furbish, MD last office visit 05/03/20 in epic  Chest x-ray - greater than 1 year in epic EKG - 05/03/20 in epic Stress Test - greater than 2 years in epic ECHO - greater than 2 years in epic Cardiac Cath - N/A Pacemaker/ICD device last checked:N/A  Sleep Study - N/A CPAP - N/A  Fasting Blood Sugar - N/A Checks Blood Sugar _N/A___ times a day  Blood Thinner Instructions:N/A Aspirin Instructions:N/A Last Dose:N/A  Activity level:   Able to exercise without symptoms     Anesthesia review: N/A  Patient denies shortness of breath, fever, cough and chest pain at PAT appointment   Patient verbalized understanding of instructions that were given to them at the PAT appointment. Patient was also instructed that they will need to review over the PAT instructions again at home before surgery.

## 2020-07-03 NOTE — Discharge Instructions (Addendum)
Thank you for allowing me to care for you today in the Emergency Department.  Please call [facility] tomorrow to schedule a follow-up appointment in the office.  Start taking 1 tablet of Flomax daily/  Take 650 mg of Tylenol or 600 mg of ibuprofen with food every 6 hours for pain.  You can alternate between these 2 medications every 3 hours if your pain returns.  For instance, you can take Tylenol at noon, followed by a dose of ibuprofen at 3, followed by second dose of Tylenol and 6.  For severe, uncontrollable pain, you can take 1 tablet of oxycodone once every 6 hours.  This is a narcotic.  It can be addicting.  Do not take with other substances or medications that may be sedating.  Do not work or drive when taking this medication as it can cause you to be impaired.  Take 1 tablet of Reglan every 6 hours as needed for nausea or vomiting.  Return to the emergency department if you develop uncontrollable pain, uncontrollable vomiting, if you start having fevers or chills, or other new, concerning symptoms.

## 2020-07-04 ENCOUNTER — Ambulatory Visit (HOSPITAL_COMMUNITY): Payer: BC Managed Care – PPO | Admitting: Certified Registered Nurse Anesthetist

## 2020-07-04 ENCOUNTER — Ambulatory Visit (HOSPITAL_COMMUNITY): Payer: BC Managed Care – PPO

## 2020-07-04 ENCOUNTER — Encounter (HOSPITAL_COMMUNITY)
Admission: RE | Disposition: A | Discharge status: Home / Self Care | Payer: Self-pay | Source: Home / Self Care | Attending: Urology

## 2020-07-04 ENCOUNTER — Ambulatory Visit (HOSPITAL_COMMUNITY)
Admission: RE | Admit: 2020-07-04 | Discharge: 2020-07-04 | Disposition: A | Discharge status: Home / Self Care | Payer: BC Managed Care – PPO | Attending: Urology | Admitting: Urology

## 2020-07-04 ENCOUNTER — Encounter (HOSPITAL_COMMUNITY): Payer: Self-pay | Admitting: Urology

## 2020-07-04 ENCOUNTER — Telehealth (HOSPITAL_COMMUNITY): Payer: Self-pay | Admitting: *Deleted

## 2020-07-04 DIAGNOSIS — Z7984 Long term (current) use of oral hypoglycemic drugs: Secondary | ICD-10-CM | POA: Insufficient documentation

## 2020-07-04 DIAGNOSIS — N202 Calculus of kidney with calculus of ureter: Secondary | ICD-10-CM | POA: Insufficient documentation

## 2020-07-04 DIAGNOSIS — E785 Hyperlipidemia, unspecified: Secondary | ICD-10-CM | POA: Diagnosis not present

## 2020-07-04 DIAGNOSIS — N200 Calculus of kidney: Secondary | ICD-10-CM

## 2020-07-04 DIAGNOSIS — Z7982 Long term (current) use of aspirin: Secondary | ICD-10-CM | POA: Insufficient documentation

## 2020-07-04 DIAGNOSIS — Z79899 Other long term (current) drug therapy: Secondary | ICD-10-CM | POA: Diagnosis not present

## 2020-07-04 DIAGNOSIS — Z8507 Personal history of malignant neoplasm of pancreas: Secondary | ICD-10-CM | POA: Insufficient documentation

## 2020-07-04 DIAGNOSIS — E119 Type 2 diabetes mellitus without complications: Secondary | ICD-10-CM | POA: Diagnosis not present

## 2020-07-04 DIAGNOSIS — M75102 Unspecified rotator cuff tear or rupture of left shoulder, not specified as traumatic: Secondary | ICD-10-CM | POA: Diagnosis not present

## 2020-07-04 HISTORY — DX: Insomnia, unspecified: G47.00

## 2020-07-04 HISTORY — DX: Cardiac arrhythmia, unspecified: I49.9

## 2020-07-04 HISTORY — PX: CYSTOSCOPY/URETEROSCOPY/HOLMIUM LASER/STENT PLACEMENT: SHX6546

## 2020-07-04 HISTORY — DX: Personal history of other diseases of the digestive system: Z87.19

## 2020-07-04 HISTORY — DX: Anesthesia of skin: R20.0

## 2020-07-04 HISTORY — DX: Unspecified malignant neoplasm of skin, unspecified: C44.90

## 2020-07-04 LAB — GLUCOSE, CAPILLARY: Glucose-Capillary: 111 mg/dL — ABNORMAL HIGH (ref 70–99)

## 2020-07-04 SURGERY — CYSTOSCOPY/URETEROSCOPY/HOLMIUM LASER/STENT PLACEMENT
Anesthesia: General | Laterality: Left

## 2020-07-04 MED ORDER — FENTANYL CITRATE (PF) 100 MCG/2ML IJ SOLN
INTRAMUSCULAR | Status: AC
  Filled 2020-07-04: qty 2

## 2020-07-04 MED ORDER — LIDOCAINE HCL (PF) 2 % IJ SOLN
INTRAMUSCULAR | Status: AC
  Filled 2020-07-04: qty 5

## 2020-07-04 MED ORDER — SODIUM CHLORIDE 0.9 % IV SOLN
INTRAVENOUS | Status: DC | PRN
  Administered 2020-07-04: 20 mL

## 2020-07-04 MED ORDER — DEXAMETHASONE SODIUM PHOSPHATE 10 MG/ML IJ SOLN
INTRAMUSCULAR | Status: DC | PRN
  Administered 2020-07-04: 10 mg via INTRAVENOUS

## 2020-07-04 MED ORDER — BELLADONNA ALKALOIDS-OPIUM 16.2-30 MG RE SUPP
RECTAL | Status: AC
  Filled 2020-07-04: qty 1

## 2020-07-04 MED ORDER — CHLORHEXIDINE GLUCONATE 0.12 % MT SOLN
15.0000 mL | Freq: Once | OROMUCOSAL | Status: AC
  Administered 2020-07-04: 15 mL via OROMUCOSAL

## 2020-07-04 MED ORDER — PROPOFOL 10 MG/ML IV BOLUS
INTRAVENOUS | Status: AC
  Filled 2020-07-04: qty 40

## 2020-07-04 MED ORDER — FENTANYL CITRATE (PF) 100 MCG/2ML IJ SOLN
50.0000 ug | Freq: Once | INTRAMUSCULAR | Status: AC
  Administered 2020-07-04: 50 ug via INTRAVENOUS

## 2020-07-04 MED ORDER — MIDAZOLAM HCL 5 MG/5ML IJ SOLN
INTRAMUSCULAR | Status: DC | PRN
  Administered 2020-07-04: 2 mg via INTRAVENOUS

## 2020-07-04 MED ORDER — LACTATED RINGERS IV SOLN: INTRAVENOUS | Status: DC

## 2020-07-04 MED ORDER — PHENAZOPYRIDINE HCL 200 MG PO TABS: 200.0000 mg | ORAL_TABLET | Freq: Three times a day (TID) | ORAL | 0 refills | Status: DC | PRN

## 2020-07-04 MED ORDER — ONDANSETRON HCL 4 MG/2ML IJ SOLN
INTRAMUSCULAR | Status: AC
  Filled 2020-07-04: qty 2

## 2020-07-04 MED ORDER — PROMETHAZINE HCL 25 MG/ML IJ SOLN: 6.2500 mg | INTRAMUSCULAR | Status: DC | PRN

## 2020-07-04 MED ORDER — LIDOCAINE 2% (20 MG/ML) 5 ML SYRINGE
INTRAMUSCULAR | Status: DC | PRN
  Administered 2020-07-04: 60 mg via INTRAVENOUS

## 2020-07-04 MED ORDER — SODIUM CHLORIDE 0.9 % IR SOLN
Status: DC | PRN
  Administered 2020-07-04: 6000 mL

## 2020-07-04 MED ORDER — MIDAZOLAM HCL 2 MG/2ML IJ SOLN
INTRAMUSCULAR | Status: AC
  Filled 2020-07-04: qty 2

## 2020-07-04 MED ORDER — FENTANYL CITRATE (PF) 100 MCG/2ML IJ SOLN
25.0000 ug | INTRAMUSCULAR | Status: DC | PRN
  Administered 2020-07-04: 50 ug via INTRAVENOUS

## 2020-07-04 MED ORDER — ORAL CARE MOUTH RINSE: 15.0000 mL | Freq: Once | OROMUCOSAL | Status: AC

## 2020-07-04 MED ORDER — FENTANYL CITRATE (PF) 100 MCG/2ML IJ SOLN
INTRAMUSCULAR | Status: DC | PRN
  Administered 2020-07-04: 25 ug via INTRAVENOUS
  Administered 2020-07-04: 50 ug via INTRAVENOUS
  Administered 2020-07-04: 25 ug via INTRAVENOUS

## 2020-07-04 MED ORDER — CEFAZOLIN SODIUM-DEXTROSE 2-4 GM/100ML-% IV SOLN
2.0000 g | INTRAVENOUS | Status: AC
  Administered 2020-07-04: 2 g via INTRAVENOUS
  Filled 2020-07-04: qty 100

## 2020-07-04 MED ORDER — OXYCODONE HCL 5 MG PO TABS
5.0000 mg | ORAL_TABLET | Freq: Four times a day (QID) | ORAL | 0 refills | Status: DC | PRN
Start: 2020-07-04 — End: 2020-07-07

## 2020-07-04 MED ORDER — PROPOFOL 10 MG/ML IV BOLUS
INTRAVENOUS | Status: DC | PRN
  Administered 2020-07-04: 200 mg via INTRAVENOUS

## 2020-07-04 MED ORDER — DEXAMETHASONE SODIUM PHOSPHATE 10 MG/ML IJ SOLN
INTRAMUSCULAR | Status: AC
  Filled 2020-07-04: qty 1

## 2020-07-04 MED ORDER — ONDANSETRON HCL 4 MG/2ML IJ SOLN
INTRAMUSCULAR | Status: DC | PRN
  Administered 2020-07-04: 4 mg via INTRAVENOUS

## 2020-07-04 SURGICAL SUPPLY — 22 items
BAG URO CATCHER STRL LF (MISCELLANEOUS) ×2 IMPLANT
BASKET ZERO TIP NITINOL 2.4FR (BASKET) IMPLANT
BSKT STON RTRVL ZERO TP 2.4FR (BASKET)
CATH URET 5FR 28IN OPEN ENDED (CATHETERS) ×2 IMPLANT
CATH URET DUAL LUMEN 6-10FR 50 (CATHETERS) ×2 IMPLANT
CLOTH BEACON ORANGE TIMEOUT ST (SAFETY) ×2 IMPLANT
EXTRACTOR STONE 1.7FRX115CM (UROLOGICAL SUPPLIES) ×2 IMPLANT
GLOVE BIOGEL M STRL SZ7.5 (GLOVE) ×2 IMPLANT
GOWN STRL REUS W/TWL XL LVL3 (GOWN DISPOSABLE) ×2 IMPLANT
GUIDEWIRE ANG ZIPWIRE 038X150 (WIRE) IMPLANT
GUIDEWIRE STR DUAL SENSOR (WIRE) ×4 IMPLANT
KIT TURNOVER KIT A (KITS) IMPLANT
MANIFOLD NEPTUNE II (INSTRUMENTS) ×2 IMPLANT
PACK CYSTO (CUSTOM PROCEDURE TRAY) ×2 IMPLANT
SHEATH URETERAL 12FRX28CM (UROLOGICAL SUPPLIES) IMPLANT
SHEATH URETERAL 12FRX35CM (MISCELLANEOUS) ×2 IMPLANT
SHEATH URETERAL 12FRX55CM (UROLOGICAL SUPPLIES) ×2 IMPLANT
STENT URET 6FRX28 CONTOUR (STENTS) ×2 IMPLANT
TRACTIP FLEXIVA PULS ID 200XHI (Laser) ×1 IMPLANT
TRACTIP FLEXIVA PULSE ID 200 (Laser) ×2
TUBING CONNECTING 10 (TUBING) ×2 IMPLANT
TUBING UROLOGY SET (TUBING) ×2 IMPLANT

## 2020-07-04 NOTE — Transfer of Care (Signed)
Immediate Anesthesia Transfer of Care Note  Patient: [name]  Procedure(s) Performed: CYSTOSCOPY LEFT RETROGRADE URETEROSCOPY/HOLMIUM LASER/STENT PLACEMENT (Left )  Patient Location: PACU  Anesthesia Type:General  Level of Consciousness: awake, alert  and oriented  Airway & Oxygen Therapy: Patient Spontanous Breathing and Patient connected to face mask oxygen  Post-op Assessment: Report given to RN and Post -op Vital signs reviewed and stable  Post vital signs: Reviewed and stable  Last Vitals:  Vitals Value Taken Time  BP    Temp    Pulse 96 07/04/20 1723  Resp 18 07/04/20 1723  SpO2 95 % 07/04/20 1723  Vitals shown include unvalidated device data.  Last Pain:  Vitals:   07/04/20 1353  TempSrc: Oral  PainSc:          Complications: No complications documented.

## 2020-07-04 NOTE — H&P (Signed)
59 year old male who presents today for follow-up from the emergency department where he is seen yesterday for acute onset left-sided flank pain the patient has a long history of nephrolithiasis and has seen several of the providers in this office over the years for such. He did have some gross hematuria in 2014 and had a negative evaluation aside from the nonobstructing stones in his kidney.   In the emergency department the patient was complaining of 10/10 pain with associated nausea and vomiting. CT scan was performed demonstrated 10 mm stone in the left mid ureter with proximal hydroureteronephrosis. His pain was treated with Toradol, Dilaudid, Reglan, and oxycodone. At the time of discharge the patient's pain is well controlled.   Interval: The patient was seen in the emergency department last night. He has not been home or been to bed since he was seen last. He is pain at the moment is controlled. He does continue to have significant pressure. He relates to be that he has no voiding symptoms or no gross hematuria. He denies any dysuria, fevers, or chills.   The patient states that he has had shockwave lithotripsy in the past which was unsuccessful and he would rather have ureteroscopy.     ALLERGIES: Merthiolate TINC Morphine Derivatives Nickel    MEDICATIONS: Metformin Hcl 500 mg tablet  Tamsulosin Hcl 0.4 mg capsule  Amoxicillin 500 mg capsule  Aspirin 81 MG TABS Oral  Celebrex 200 mg capsule Oral  Centrum  Cetirizine Hcl 10 mg tablet  Contour Next Test Strip  Cyclobenzaprine Hcl 10 mg tablet  Diphenhydramine Hcl 50 mg capsule  Gabapentin 100 mg capsule  Jardiance 10 mg tablet  Lipitor 20 MG Oral Tablet Oral  Lorazepam 1 mg tablet  Lunesta 2 mg tablet  MetFORMIN HCl - 500 MG Oral Tablet Oral  Potassium Citrate 10 meq (1,080 mg) tablet, extended release  Reglan 10 mg tablet  Rosuvastatin Calcium 20 mg tablet  Zyrtec 10 mg tablet Oral     GU PSH: Cysto Uretero Lithotripsy -  2010 Cystoscopy Insert Stent - 2010 ESWL - 2010, 2008 Vasectomy - 2008       [place] Notes: Cystoscopy With Ureteroscopy With Lithotripsy, Cystoscopy With Insertion Of Ureteral Stent Left, Lithotripsy, Pancreatic Surgery, Back Surgery, Tonsillectomy, Surgery Of Male Genitalia Vasectomy, Lithotripsy   NON-GU PSH: Pancreas Surgery (Unspecified) - 2010 Remove Tonsils - 2008     GU PMH: BPH w/o LUTS, Benign prostatic hypertrophy without lower urinary tract symptoms - 2014 Gross hematuria, Gross hematuria - 2014 LUQ pain, Abdominal Pain In The Left Upper Belly (LUQ) - 2014 Other microscopic hematuria, Microscopic hematuria - 2014 Renal calculus, Bilateral kidney stones - 2014 Renal cyst, Renal cyst, acquired - 2014      PMH Notes: pancreatic cancer  2012-10-27 10:57:32 - Note: Neuroendocrine Carcinoma Of The Pancreas   NON-GU PMH: Anxiety, Anxiety - 2014 Personal history of other diseases of the circulatory system, History of hypertension - 2014 Personal history of other endocrine, nutritional and metabolic disease, History of hypercholesterolemia - 2014, History of diabetes mellitus, - 2014 Personal history of other mental and behavioral disorders, History of depression - 2014 Arthritis Diabetes Type 2 Encounter for general adult medical examination without abnormal findings, Encounter for preventive health examination Hypercholesterolemia    FAMILY HISTORY: 1 Daughter - Daughter Family Health Status Children _4__ Living Daughter - Sister Obesity - Mother, Sister osteoarthritis - Father stroke - Father   SOCIAL HISTORY: Marital Status: Married Preferred Language: English; Ethnicity: Not Hispanic Or Latino;  Race: White Current Smoking Status: Patient has never smoked.   Tobacco Use Assessment Completed: Used Tobacco in last 30 days? Has never drank.  Drinks 2 caffeinated drinks per day. Patient's occupation is/was Retired.     Notes: Never A Smoker, Tobacco Use, Caffeine Use,  Alcohol Use, Marital History - Currently Married, Occupation:   REVIEW OF SYSTEMS:    GU Review Male:   Patient denies frequent urination, hard to postpone urination, burning/ pain with urination, get up at night to urinate, leakage of urine, stream starts and stops, trouble starting your stream, have to strain to urinate , erection problems, and penile pain.  Gastrointestinal (Upper):   Patient reports nausea, vomiting, and indigestion/ heartburn.   Gastrointestinal (Lower):   Patient denies diarrhea and constipation.  Constitutional:   Patient denies night sweats, weight loss, fatigue, and fever.  Skin:   Patient denies skin rash/ lesion and itching.  Eyes:   Patient denies blurred vision and double vision.  Ears/ Nose/ Throat:   Patient denies sore throat and sinus problems.  Hematologic/Lymphatic:   Patient denies swollen glands and easy bruising.  Cardiovascular:   Patient denies leg swelling and chest pains.  Respiratory:   Patient denies cough and shortness of breath.  Endocrine:   Patient denies excessive thirst.  Musculoskeletal:   Patient reports back pain and joint pain.   Neurological:   Patient reports headaches and dizziness.   Psychologic:   Patient denies depression and anxiety.   VITAL SIGNS:      07/03/2020 10:09 AM  Weight 254 lb / 115.21 kg  BP 118/74 mmHg  Pulse 69 /min  Temperature 97.5 F / 36.3 C   MULTI-SYSTEM PHYSICAL EXAMINATION:    Constitutional: Well-nourished. No physical deformities. Normally developed. Good grooming.  Neck: Neck symmetrical, not swollen. Normal tracheal position.  Respiratory: Normal breath sounds. No labored breathing, no use of accessory muscles.   Cardiovascular: Regular rate and rhythm. No murmur, no gallop. Normal temperature, normal extremity pulses, no swelling, no varicosities.   Lymphatic: No enlargement of neck, axillae, groin.  Skin: No paleness, no jaundice, no cyanosis. No lesion, no ulcer, no rash.  Neurologic /  Psychiatric: Oriented to time, oriented to place, oriented to person. No depression, no anxiety, no agitation.  Gastrointestinal: No mass, no tenderness, no rigidity, non obese abdomen.  Eyes: Normal conjunctivae. Normal eyelids.  Ears, Nose, Mouth, and Throat: Left ear no scars, no lesions, no masses. Right ear no scars, no lesions, no masses. Nose no scars, no lesions, no masses. Normal hearing. Normal lips.  Musculoskeletal: Normal gait and station of head and neck.     Complexity of Data:  Source Of History:  Patient  Records Review:   Previous Doctor Records, Previous Patient Records  Urine Test Review:   Urinalysis   PROCEDURES:         KUB - 40973  A single view of the abdomen is obtained. Renal shadows are easily visualized bilaterally. There are 2 fairly sizable stones, 1 in each renal pelvis. In addition, the patient has a stone in the left mid ureter around the L to level. There are no additional calcifications along the expected location of either ureter bilaterally.  Gas pattern is grossly normal. No significant bony abnormalities.      Impression: The patient has 1 nonobstructing stone in each kidney as well as the large left mid ureteral stone.  Patient confirmed No Neulasta OnPro Device.  Urinalysis Dipstick Dipstick Cont'd  Color: Yellow Bilirubin: Neg mg/dL  Appearance: Clear Ketones: Neg mg/dL  Specific Gravity: 1.025 Blood: Neg ery/uL  pH: <=5.0 Protein: Neg mg/dL  Glucose: 3+ mg/dL Urobilinogen: 0.2 mg/dL    Nitrites: Neg    Leukocyte Esterase: Neg leu/uL    ASSESSMENT:      ICD-10 Details  1 GU:   Renal calculus - N20.0   2   Ureteral calculus - N20.1    PLAN:           Orders X-Rays: KUB          Schedule         Document Letter(s):  Created for Patient: Clinical Summary         Notes:   The patient has a large left obstructing ureteral stone with associated renal colic symptoms. He is eager to get this treated, but does not  want to go with shockwave lithotripsy because of past failures. In addition, the patient is not willing to accept the stent tonight at on an urgent basis [facility]. Instead, will plan to schedule the patient soon as possible for left-sided ureteroscopy and laser lithotripsy and stent placement. The patient would subsequently like to get the right side done as well. We will plan that shortly afterwards.

## 2020-07-04 NOTE — Op Note (Signed)
Preoperative diagnosis:  1. Left mid ureteral stone with associated renal colic 2. Left nonobstructing renal stones  Postoperative diagnosis:  1. Same  Procedure: 1. Cystoscopy, left retrograde pyelogram with interpretation 2. Left ureteroscopy, laser lithotripsy stone extraction 3. Left ureteral stent placement  Surgeon: Ardis Hughs, MD  Anesthesia: General  Complications: None  Intraoperative findings:  #1: [name] mid ureteral stone was noted to be in the proximal ureter around the level of L2.  There was proximal hydroureteronephrosis on the retrograde pyelogram.  The distal aspect of the ureter was normal-appearing.  There were no other filling defects or abnormalities. #2: The mid ureteral stone was pushed up into the renal pelvis during laser fragmentation from the pulsations of the laser.  The stone was fragmented into several pieces and some of these pieces were removed.  There was significant bleeding from the renal pelvis and visualization was obscured.  Stent was placed at the end of the case and decision made to stage the procedure at that time.  EBL: Minimal  Specimens: None  Indication: [name] is a 59 y.o. patient with symptomatic left ureteral stone with additional stones in the left renal pelvis.  After reviewing the management options for treatment, he elected to proceed with the above surgical procedure(s). We have discussed the potential benefits and risks of the procedure, side effects of the proposed treatment, the likelihood of the patient achieving the goals of the procedure, and any potential problems that might occur during the procedure or recuperation. Informed consent has been obtained.  Description of procedure:  The patient was taken to the operating room and general anesthesia was induced.  The patient was placed in the dorsal lithotomy position, prepped and draped in the usual sterile fashion, and preoperative antibiotics were  administered. A preoperative time-out was performed.   21 French 2 degrees cystoscope was gently passed to the patient's urethra into the bladder visual guidance.  Cystoscopy was performed with no additional or unusual findings.  The ureters were orthotopic.  5 French open-ended ureteral catheter was then used to perform a left retrograde pyelogram with the above findings.  I then advanced a wire up into the left renal pelvis and remove the open-ended catheter.  I then drained the bladder and remove the cystoscope.  I then exchanged the scope for a semirigid ureteroscope.  I advanced this up to the stone and was a 200 m laser fiber fragmented the stone.  However, it did propulsive into the renal pelvis.  At this point I opted to advance a second wire up through the scope and into the renal pelvis removing scope over the wire.  I then advanced a 12/14 French ureteral access sheath up into the proximal ureter.  I used the flexible ureteroscope and located the patient's stone in the upper pole.  I began fragmenting the stone and removing pieces.  At this point it became quite bloody and there was significant clot within the renal pelvis obscuring my visualization and making it hard for me to locate additional stones.  Given that the patient is scheduled for a right ureteroscopy and coming weeks I opted to stage the left procedure and come back once the bleeding has stopped to ensure that no additional stones remain.  As such, I slowly backed out the ureteral access sheath inspecting the ureter on the way out.  I did know that there was some trauma from the access sheath in the distal ureter.  I then backloaded the  cystoscope over the wire and advanced it into the bladder under visual guidance.  I then advanced the 28 cm time 6 French double-J ureteral stent over the wire and into the left renal pelvis.  Once the stent was noted to be well within the renal pelvis I backed the scope up to the patient's bladder neck  and once the stent was well within the bladder I remove the wire noting a nice curl within the renal pelvis and the bladder.  The stent tether was not left on.  The bladder was subsequently emptied.  The patient was subsequently extubated return the PACU in stable condition.  Disposition: The patient will be scheduled for second stage left ureteroscopy and right ureteroscopy within 1 week.  Ardis Hughs, M.D.

## 2020-07-04 NOTE — Interval H&P Note (Signed)
History and Physical Interval Note:  07/04/2020 3:17 PM  [name]  has presented today for surgery, with the diagnosis of LEFT URETERAL STONE.  The various methods of treatment have been discussed with the patient and family. After consideration of risks, benefits and other options for treatment, the patient has consented to  Procedure(s): CYSTOSCOPY LEFT RETROGRADE URETEROSCOPY/HOLMIUM LASER/STENT PLACEMENT (Left) as a surgical intervention.  [name] history has been reviewed, patient examined, no change in status, stable for surgery.  I have reviewed the patient's chart and labs.  Questions were answered to the patient's satisfaction.     Ardis Hughs

## 2020-07-04 NOTE — H&P (View-Only) (Signed)
59 year old male who presents today for follow-up from the emergency department where he is seen yesterday for acute onset left-sided flank pain the patient has a long history of nephrolithiasis and has seen several of the providers in this office over the years for such. He did have some gross hematuria in 2014 and had a negative evaluation aside from the nonobstructing stones in his kidney.   In the emergency department the patient was complaining of 10/10 pain with associated nausea and vomiting. CT scan was performed demonstrated 10 mm stone in the left mid ureter with proximal hydroureteronephrosis. His pain was treated with Toradol, Dilaudid, Reglan, and oxycodone. At the time of discharge the patient's pain is well controlled.   Interval: The patient was seen in the emergency department last night. He has not been home or been to bed since he was seen last. He is pain at the moment is controlled. He does continue to have significant pressure. He relates to be that he has no voiding symptoms or no gross hematuria. He denies any dysuria, fevers, or chills.   The patient states that he has had shockwave lithotripsy in the past which was unsuccessful and he would rather have ureteroscopy.     ALLERGIES: Merthiolate TINC Morphine Derivatives Nickel    MEDICATIONS: Metformin Hcl 500 mg tablet  Tamsulosin Hcl 0.4 mg capsule  Amoxicillin 500 mg capsule  Aspirin 81 MG TABS Oral  Celebrex 200 mg capsule Oral  Centrum  Cetirizine Hcl 10 mg tablet  Contour Next Test Strip  Cyclobenzaprine Hcl 10 mg tablet  Diphenhydramine Hcl 50 mg capsule  Gabapentin 100 mg capsule  Jardiance 10 mg tablet  Lipitor 20 MG Oral Tablet Oral  Lorazepam 1 mg tablet  Lunesta 2 mg tablet  MetFORMIN HCl - 500 MG Oral Tablet Oral  Potassium Citrate 10 meq (1,080 mg) tablet, extended release  Reglan 10 mg tablet  Rosuvastatin Calcium 20 mg tablet  Zyrtec 10 mg tablet Oral     GU PSH: Cysto Uretero Lithotripsy -  2010 Cystoscopy Insert Stent - 2010 ESWL - 2010, 2008 Vasectomy - 2008       [place] Notes: Cystoscopy With Ureteroscopy With Lithotripsy, Cystoscopy With Insertion Of Ureteral Stent Left, Lithotripsy, Pancreatic Surgery, Back Surgery, Tonsillectomy, Surgery Of Male Genitalia Vasectomy, Lithotripsy   NON-GU PSH: Pancreas Surgery (Unspecified) - 2010 Remove Tonsils - 2008     GU PMH: BPH w/o LUTS, Benign prostatic hypertrophy without lower urinary tract symptoms - 2014 Gross hematuria, Gross hematuria - 2014 LUQ pain, Abdominal Pain In The Left Upper Belly (LUQ) - 2014 Other microscopic hematuria, Microscopic hematuria - 2014 Renal calculus, Bilateral kidney stones - 2014 Renal cyst, Renal cyst, acquired - 2014      PMH Notes: pancreatic cancer  2012-10-27 10:57:32 - Note: Neuroendocrine Carcinoma Of The Pancreas   NON-GU PMH: Anxiety, Anxiety - 2014 Personal history of other diseases of the circulatory system, History of hypertension - 2014 Personal history of other endocrine, nutritional and metabolic disease, History of hypercholesterolemia - 2014, History of diabetes mellitus, - 2014 Personal history of other mental and behavioral disorders, History of depression - 2014 Arthritis Diabetes Type 2 Encounter for general adult medical examination without abnormal findings, Encounter for preventive health examination Hypercholesterolemia    FAMILY HISTORY: 1 Daughter - Daughter Family Health Status Children _4__ Living Daughter - Sister Obesity - Mother, Sister osteoarthritis - Father stroke - Father   SOCIAL HISTORY: Marital Status: Married Preferred Language: English; Ethnicity: Not Hispanic Or Latino;  Race: White Current Smoking Status: Patient has never smoked.   Tobacco Use Assessment Completed: Used Tobacco in last 30 days? Has never drank.  Drinks 2 caffeinated drinks per day. Patient's occupation is/was Retired.     Notes: Never A Smoker, Tobacco Use, Caffeine Use,  Alcohol Use, Marital History - Currently Married, Occupation:   REVIEW OF SYSTEMS:    GU Review Male:   Patient denies frequent urination, hard to postpone urination, burning/ pain with urination, get up at night to urinate, leakage of urine, stream starts and stops, trouble starting your stream, have to strain to urinate , erection problems, and penile pain.  Gastrointestinal (Upper):   Patient reports nausea, vomiting, and indigestion/ heartburn.   Gastrointestinal (Lower):   Patient denies diarrhea and constipation.  Constitutional:   Patient denies night sweats, weight loss, fatigue, and fever.  Skin:   Patient denies skin rash/ lesion and itching.  Eyes:   Patient denies blurred vision and double vision.  Ears/ Nose/ Throat:   Patient denies sore throat and sinus problems.  Hematologic/Lymphatic:   Patient denies swollen glands and easy bruising.  Cardiovascular:   Patient denies leg swelling and chest pains.  Respiratory:   Patient denies cough and shortness of breath.  Endocrine:   Patient denies excessive thirst.  Musculoskeletal:   Patient reports back pain and joint pain.   Neurological:   Patient reports headaches and dizziness.   Psychologic:   Patient denies depression and anxiety.   VITAL SIGNS:      07/03/2020 10:09 AM  Weight 254 lb / 115.21 kg  BP 118/74 mmHg  Pulse 69 /min  Temperature 97.5 F / 36.3 C   MULTI-SYSTEM PHYSICAL EXAMINATION:    Constitutional: Well-nourished. No physical deformities. Normally developed. Good grooming.  Neck: Neck symmetrical, not swollen. Normal tracheal position.  Respiratory: Normal breath sounds. No labored breathing, no use of accessory muscles.   Cardiovascular: Regular rate and rhythm. No murmur, no gallop. Normal temperature, normal extremity pulses, no swelling, no varicosities.   Lymphatic: No enlargement of neck, axillae, groin.  Skin: No paleness, no jaundice, no cyanosis. No lesion, no ulcer, no rash.  Neurologic /  Psychiatric: Oriented to time, oriented to place, oriented to person. No depression, no anxiety, no agitation.  Gastrointestinal: No mass, no tenderness, no rigidity, non obese abdomen.  Eyes: Normal conjunctivae. Normal eyelids.  Ears, Nose, Mouth, and Throat: Left ear no scars, no lesions, no masses. Right ear no scars, no lesions, no masses. Nose no scars, no lesions, no masses. Normal hearing. Normal lips.  Musculoskeletal: Normal gait and station of head and neck.     Complexity of Data:  Source Of History:  Patient  Records Review:   Previous Doctor Records, Previous Patient Records  Urine Test Review:   Urinalysis   PROCEDURES:         KUB - 85027  A single view of the abdomen is obtained. Renal shadows are easily visualized bilaterally. There are 2 fairly sizable stones, 1 in each renal pelvis. In addition, the patient has a stone in the left mid ureter around the L to level. There are no additional calcifications along the expected location of either ureter bilaterally.  Gas pattern is grossly normal. No significant bony abnormalities.      Impression: The patient has 1 nonobstructing stone in each kidney as well as the large left mid ureteral stone.  Patient confirmed No Neulasta OnPro Device.  Urinalysis Dipstick Dipstick Cont'd  Color: Yellow Bilirubin: Neg mg/dL  Appearance: Clear Ketones: Neg mg/dL  Specific Gravity: 1.025 Blood: Neg ery/uL  pH: <=5.0 Protein: Neg mg/dL  Glucose: 3+ mg/dL Urobilinogen: 0.2 mg/dL    Nitrites: Neg    Leukocyte Esterase: Neg leu/uL    ASSESSMENT:      ICD-10 Details  1 GU:   Renal calculus - N20.0   2   Ureteral calculus - N20.1    PLAN:           Orders X-Rays: KUB          Schedule         Document Letter(s):  Created for Patient: Clinical Summary         Notes:   The patient has a large left obstructing ureteral stone with associated renal colic symptoms. He is eager to get this treated, but does not  want to go with shockwave lithotripsy because of past failures. In addition, the patient is not willing to accept the stent tonight at on an urgent basis to [facility]. Instead, will plan to schedule the patient soon as possible for left-sided ureteroscopy and laser lithotripsy and stent placement. The patient would subsequently like to get the right side done as well. We will plan that shortly afterwards.

## 2020-07-04 NOTE — Anesthesia Postprocedure Evaluation (Signed)
Anesthesia Post Note  Patient: [name]  Procedure(s) Performed: CYSTOSCOPY LEFT RETROGRADE URETEROSCOPY/HOLMIUM LASER/STENT PLACEMENT (Left )     Patient location during evaluation: PACU Anesthesia Type: General Level of consciousness: awake and alert Pain management: pain level controlled Vital Signs Assessment: post-procedure vital signs reviewed and stable Respiratory status: spontaneous breathing, nonlabored ventilation, respiratory function stable and patient connected to nasal cannula oxygen Cardiovascular status: blood pressure returned to baseline and stable Postop Assessment: no apparent nausea or vomiting Anesthetic complications: no   No complications documented.  Last Vitals:  Vitals:   07/04/20 1800 07/04/20 1815  BP: 127/74 126/70  Pulse: 83 88  Resp: 11 12  Temp:    SpO2: 94% 94%    Last Pain:  Vitals:   07/04/20 1815  TempSrc:   PainSc: 4                  Lalanya Rufener

## 2020-07-04 NOTE — Discharge Instructions (Signed)
DISCHARGE INSTRUCTIONS FOR KIDNEY STONE/URETERAL STENT   MEDICATIONS:  1. Resume all your other meds from home - except do not take any extra narcotic pain meds that you may have at home.  2. Pyridium is to help with the burning/stinging when you urinate. 3. Tramadol is for moderate/severe pain, otherwise taking upto 1000 mg every 6 hours of plainTylenol will help treat your pain.   4. Take Cipro one hour prior to removal of your stent.   ACTIVITY:  1. No strenuous activity x 1week  2. No driving while on narcotic pain medications  3. Drink plenty of water  4. Continue to walk at home - you can still get blood clots when you are at home, so keep active, but don't over do it.  5. May return to work/school tomorrow or when you feel ready   BATHING:  1. You can shower and we recommend daily showers  2. You have a string coming from your urethra: The stent string is attached to your ureteral stent. Do not pull on this.   SIGNS/SYMPTOMS TO CALL:  Please call us if you have a fever greater than 101.5, uncontrolled nausea/vomiting, uncontrolled pain, dizziness, unable to urinate, bloody urine, chest pain, shortness of breath, leg swelling, leg pain, redness around wound, drainage from wound, or any other concerns or questions.   You can reach [place] at [phone].   FOLLOW-UP:  1. You will be scheduled for ureteroscopy within the next 7 to 10 days.

## 2020-07-04 NOTE — Anesthesia Procedure Notes (Signed)
Procedure Name: Intubation Date/Time: 07/04/2020 3:30 PM Performed by: Kaivon Livesey, Clinical cytogeneticist D, CRNA Pre-anesthesia Checklist: Patient identified, Emergency Drugs available, Suction available and Patient being monitored Patient Re-evaluated:Patient Re-evaluated prior to induction Oxygen Delivery Method: Circle system utilized Preoxygenation: Pre-oxygenation with 100% oxygen Induction Type: IV induction Ventilation: Mask ventilation without difficulty LMA: LMA inserted LMA Size: 5.0 Tube type: Oral Number of attempts: 1 Airway Equipment and Method: Oral airway Placement Confirmation: positive ETCO2 Tube secured with: Tape Dental Injury: Teeth and Oropharynx as per pre-operative assessment

## 2020-07-04 NOTE — Interval H&P Note (Signed)
History and Physical Interval Note:  07/04/2020 3:16 PM  [name]  has presented today for surgery, with the diagnosis of LEFT URETERAL STONE.  The various methods of treatment have been discussed with the patient and family. After consideration of risks, benefits and other options for treatment, the patient has consented to  Procedure(s): CYSTOSCOPY LEFT RETROGRADE URETEROSCOPY/HOLMIUM LASER/STENT PLACEMENT (Left) as a surgical intervention.  [name] history has been reviewed, patient examined, no change in status, stable for surgery.  I have reviewed the patient's chart and labs.  Questions were answered to the patient's satisfaction.     Ardis Hughs

## 2020-07-04 NOTE — Anesthesia Preprocedure Evaluation (Addendum)
Anesthesia Evaluation  Patient identified by MRN, date of birth, ID band Patient awake    Reviewed: Allergy & Precautions, NPO status , Patient's Chart, lab work & pertinent test results  History of Anesthesia Complications Negative for: history of anesthetic complications  Airway Mallampati: II  TM Distance: >3 FB Neck ROM: Full    Dental  (+) Dental Advisory Given, Caps   Pulmonary neg pulmonary ROS,    Pulmonary exam normal        Cardiovascular negative cardio ROS Normal cardiovascular exam     Neuro/Psych  Headaches, PSYCHIATRIC DISORDERS Anxiety    GI/Hepatic Neg liver ROS, hiatal hernia,  Pancreatic cancer s/p excision    Endo/Other  diabetes, Type 2, Oral Hypoglycemic Agents Obesity   Renal/GU CRFRenal disease     Musculoskeletal  (+) Arthritis ,   Abdominal   Peds  Hematology negative hematology ROS (+)   Anesthesia Other Findings Covid test negative   Reproductive/Obstetrics                            Anesthesia Physical Anesthesia Plan  ASA: III  Anesthesia Plan: General   Post-op Pain Management:    Induction: Intravenous  PONV Risk Score and Plan: 3 and Treatment may vary due to age or medical condition, Ondansetron, Dexamethasone and Midazolam  Airway Management Planned: LMA  Additional Equipment: None  Intra-op Plan:   Post-operative Plan: Extubation in OR  Informed Consent: I have reviewed the patients History and Physical, chart, labs and discussed the procedure including the risks, benefits and alternatives for the proposed anesthesia with the patient or authorized representative who has indicated his/her understanding and acceptance.     Dental advisory given  Plan Discussed with: CRNA and Anesthesiologist  Anesthesia Plan Comments:        Anesthesia Quick Evaluation

## 2020-07-05 ENCOUNTER — Encounter (HOSPITAL_COMMUNITY): Payer: Self-pay

## 2020-07-05 ENCOUNTER — Emergency Department (HOSPITAL_COMMUNITY): Payer: BC Managed Care – PPO

## 2020-07-05 ENCOUNTER — Other Ambulatory Visit: Payer: Self-pay

## 2020-07-05 ENCOUNTER — Observation Stay (HOSPITAL_COMMUNITY)
Admission: EM | Admit: 2020-07-05 | Discharge: 2020-07-07 | Disposition: A | Discharge status: Home / Self Care | Payer: BC Managed Care – PPO | Attending: Urology | Admitting: Urology

## 2020-07-05 ENCOUNTER — Other Ambulatory Visit: Payer: Self-pay | Admitting: Urology

## 2020-07-05 DIAGNOSIS — K760 Fatty (change of) liver, not elsewhere classified: Secondary | ICD-10-CM | POA: Diagnosis not present

## 2020-07-05 DIAGNOSIS — R000 Tachycardia, unspecified: Secondary | ICD-10-CM | POA: Diagnosis not present

## 2020-07-05 DIAGNOSIS — N200 Calculus of kidney: Secondary | ICD-10-CM | POA: Diagnosis not present

## 2020-07-05 DIAGNOSIS — R109 Unspecified abdominal pain: Secondary | ICD-10-CM | POA: Diagnosis not present

## 2020-07-05 DIAGNOSIS — E119 Type 2 diabetes mellitus without complications: Secondary | ICD-10-CM | POA: Diagnosis not present

## 2020-07-05 DIAGNOSIS — N189 Chronic kidney disease, unspecified: Secondary | ICD-10-CM | POA: Diagnosis not present

## 2020-07-05 DIAGNOSIS — Z7984 Long term (current) use of oral hypoglycemic drugs: Secondary | ICD-10-CM | POA: Diagnosis not present

## 2020-07-05 DIAGNOSIS — N12 Tubulo-interstitial nephritis, not specified as acute or chronic: Secondary | ICD-10-CM | POA: Diagnosis not present

## 2020-07-05 DIAGNOSIS — R1084 Generalized abdominal pain: Secondary | ICD-10-CM | POA: Diagnosis not present

## 2020-07-05 DIAGNOSIS — N201 Calculus of ureter: Secondary | ICD-10-CM | POA: Diagnosis not present

## 2020-07-05 DIAGNOSIS — Z96652 Presence of left artificial knee joint: Secondary | ICD-10-CM | POA: Diagnosis not present

## 2020-07-05 DIAGNOSIS — Z79899 Other long term (current) drug therapy: Secondary | ICD-10-CM | POA: Insufficient documentation

## 2020-07-05 LAB — COMPREHENSIVE METABOLIC PANEL
ALT: 12 U/L (ref 0–44)
AST: 16 U/L (ref 15–41)
Albumin: 4.3 g/dL (ref 3.5–5.0)
Alkaline Phosphatase: 49 U/L (ref 38–126)
Anion gap: 12 (ref 5–15)
BUN: 21 mg/dL — ABNORMAL HIGH (ref 6–20)
CO2: 21 mmol/L — ABNORMAL LOW (ref 22–32)
Calcium: 9.1 mg/dL (ref 8.9–10.3)
Chloride: 104 mmol/L (ref 98–111)
Creatinine, Ser: 0.7 mg/dL (ref 0.61–1.24)
GFR, Estimated: >60 mL/min (ref >60)
Glucose, Bld: 166 mg/dL — ABNORMAL HIGH (ref 70–99)
Potassium: 4.5 mmol/L (ref 3.5–5.1)
Sodium: 137 mmol/L (ref 135–145)
Total Bilirubin: 1.2 mg/dL (ref 0.3–1.2)
Total Protein: 7.4 g/dL (ref 6.5–8.1)

## 2020-07-05 LAB — CBC WITH DIFFERENTIAL/PLATELET
Abs Immature Granulocytes: 0.14 10*3/uL — ABNORMAL HIGH (ref 0.00–0.07)
Basophils Absolute: 0 10*3/uL (ref 0.0–0.1)
Basophils Relative: 0 %
Eosinophils Absolute: 0 10*3/uL (ref 0.0–0.5)
Eosinophils Relative: 0 %
HCT: 44.8 % (ref 39.0–52.0)
Hemoglobin: 15.2 g/dL (ref 13.0–17.0)
Immature Granulocytes: 1 %
Lymphocytes Relative: 6 %
Lymphs Abs: 0.7 10*3/uL (ref 0.7–4.0)
MCH: 30.8 pg (ref 26.0–34.0)
MCHC: 33.9 g/dL (ref 30.0–36.0)
MCV: 90.9 fL (ref 80.0–100.0)
Monocytes Absolute: 0.5 10*3/uL (ref 0.1–1.0)
Monocytes Relative: 4 %
Neutro Abs: 9.6 10*3/uL — ABNORMAL HIGH (ref 1.7–7.7)
Neutrophils Relative %: 89 %
Platelets: 240 10*3/uL (ref 150–400)
RBC: 4.93 MIL/uL (ref 4.22–5.81)
RDW: 12.2 % (ref 11.5–15.5)
WBC: 10.9 10*3/uL — ABNORMAL HIGH (ref 4.0–10.5)
nRBC: 0 % (ref 0.0–0.2)

## 2020-07-05 LAB — URINALYSIS, ROUTINE W REFLEX MICROSCOPIC
Glucose, UA: >=500 mg/dL — AB
Ketones, ur: 40 mg/dL — AB
Nitrite: POSITIVE — AB
Protein, ur: 100 mg/dL — AB
RBC / HPF: >50 RBC/hpf — ABNORMAL HIGH (ref 0–5)
Specific Gravity, Urine: 1.025 (ref 1.005–1.030)
pH: 5 (ref 5.0–8.0)

## 2020-07-05 LAB — LACTIC ACID, PLASMA: Lactic Acid, Venous: 1.1 mmol/L (ref 0.5–1.9)

## 2020-07-05 LAB — LIPASE, BLOOD: Lipase: 28 U/L (ref 11–51)

## 2020-07-05 LAB — GLUCOSE, CAPILLARY: Glucose-Capillary: 159 mg/dL — ABNORMAL HIGH (ref 70–99)

## 2020-07-05 MED ORDER — POLYETHYLENE GLYCOL 3350 17 G PO PACK: 17.0000 g | PACK | Freq: Every day | ORAL | Status: DC | PRN

## 2020-07-05 MED ORDER — GABAPENTIN 100 MG PO CAPS
100.0000 mg | ORAL_CAPSULE | Freq: Every day | ORAL | Status: DC
  Administered 2020-07-05 – 2020-07-06 (×2): 100 mg via ORAL
  Filled 2020-07-05 (×2): qty 1

## 2020-07-05 MED ORDER — IOHEXOL 300 MG/ML  SOLN
100.0000 mL | Freq: Once | INTRAMUSCULAR | Status: AC | PRN
  Administered 2020-07-05: 100 mL via INTRAVENOUS

## 2020-07-05 MED ORDER — ONDANSETRON HCL 4 MG/2ML IJ SOLN
4.0000 mg | Freq: Once | INTRAMUSCULAR | Status: AC
  Administered 2020-07-05: 4 mg via INTRAVENOUS
  Filled 2020-07-05: qty 2

## 2020-07-05 MED ORDER — BELLADONNA ALKALOIDS-OPIUM 16.2-60 MG RE SUPP
1.0000 | Freq: Three times a day (TID) | RECTAL | Status: DC | PRN
  Administered 2020-07-06 – 2020-07-07 (×3): 1 via RECTAL
  Filled 2020-07-05 (×3): qty 1

## 2020-07-05 MED ORDER — LORAZEPAM 1 MG PO TABS
1.0000 mg | ORAL_TABLET | Freq: Two times a day (BID) | ORAL | Status: DC | PRN
  Administered 2020-07-06 – 2020-07-07 (×2): 1 mg via ORAL
  Filled 2020-07-05 (×2): qty 1

## 2020-07-05 MED ORDER — TAMSULOSIN HCL 0.4 MG PO CAPS: 0.4000 mg | ORAL_CAPSULE | Freq: Every day | ORAL | Status: DC

## 2020-07-05 MED ORDER — ACETAMINOPHEN 325 MG PO TABS: 650.0000 mg | ORAL_TABLET | ORAL | Status: DC | PRN

## 2020-07-05 MED ORDER — HYDROMORPHONE HCL 1 MG/ML IJ SOLN
0.5000 mg | INTRAMUSCULAR | Status: DC | PRN
  Administered 2020-07-06 (×5): 0.5 mg via INTRAVENOUS
  Filled 2020-07-05 (×5): qty 0.5

## 2020-07-05 MED ORDER — SODIUM CHLORIDE 0.9 % IV SOLN: Freq: Once | INTRAVENOUS | Status: AC

## 2020-07-05 MED ORDER — LACTULOSE 10 GM/15ML PO SOLN
20.0000 g | Freq: Two times a day (BID) | ORAL | Status: DC
  Administered 2020-07-05 – 2020-07-07 (×4): 20 g via ORAL
  Filled 2020-07-05 (×5): qty 30

## 2020-07-05 MED ORDER — TAMSULOSIN HCL 0.4 MG PO CAPS
0.4000 mg | ORAL_CAPSULE | Freq: Every day | ORAL | Status: DC
  Administered 2020-07-05 – 2020-07-06 (×2): 0.4 mg via ORAL
  Filled 2020-07-05 (×4): qty 1

## 2020-07-05 MED ORDER — HYDROMORPHONE HCL 1 MG/ML IJ SOLN
1.0000 mg | Freq: Once | INTRAMUSCULAR | Status: AC
  Administered 2020-07-05: 1 mg via INTRAVENOUS
  Filled 2020-07-05: qty 1

## 2020-07-05 MED ORDER — SODIUM CHLORIDE 0.9 % IV SOLN
2.0000 g | INTRAVENOUS | Status: DC
  Administered 2020-07-06 – 2020-07-07 (×2): 2 g via INTRAVENOUS
  Filled 2020-07-05 (×2): qty 2

## 2020-07-05 MED ORDER — ONDANSETRON HCL 4 MG/2ML IJ SOLN
4.0000 mg | INTRAMUSCULAR | Status: DC | PRN
  Administered 2020-07-06: 4 mg via INTRAVENOUS
  Filled 2020-07-05: qty 2

## 2020-07-05 MED ORDER — ROSUVASTATIN CALCIUM 20 MG PO TABS
20.0000 mg | ORAL_TABLET | Freq: Every day | ORAL | Status: DC
  Administered 2020-07-05 – 2020-07-06 (×2): 20 mg via ORAL
  Filled 2020-07-05 (×2): qty 1

## 2020-07-05 MED ORDER — DOCUSATE SODIUM 100 MG PO CAPS
100.0000 mg | ORAL_CAPSULE | Freq: Two times a day (BID) | ORAL | Status: DC
  Administered 2020-07-05 – 2020-07-07 (×4): 100 mg via ORAL
  Filled 2020-07-05 (×4): qty 1

## 2020-07-05 MED ORDER — LORAZEPAM 2 MG/ML IJ SOLN
1.0000 mg | Freq: Once | INTRAMUSCULAR | Status: AC
  Administered 2020-07-05: 1 mg via INTRAVENOUS
  Filled 2020-07-05: qty 1

## 2020-07-05 MED ORDER — OXYCODONE HCL 5 MG PO TABS
5.0000 mg | ORAL_TABLET | ORAL | Status: DC | PRN
  Administered 2020-07-06 – 2020-07-07 (×4): 10 mg via ORAL
  Filled 2020-07-05 (×2): qty 2
  Filled 2020-07-05: qty 1
  Filled 2020-07-05 (×2): qty 2

## 2020-07-05 MED ORDER — HYDROMORPHONE HCL 1 MG/ML IJ SOLN: 0.5000 mg | INTRAMUSCULAR | Status: DC | PRN

## 2020-07-05 MED ORDER — SODIUM CHLORIDE 0.9 % IV SOLN
1.0000 g | Freq: Once | INTRAVENOUS | Status: AC
  Administered 2020-07-05: 1 g via INTRAVENOUS
  Filled 2020-07-05: qty 10

## 2020-07-05 MED ORDER — OXYCODONE HCL 5 MG PO TABS: 5.0000 mg | ORAL_TABLET | ORAL | Status: DC | PRN

## 2020-07-05 MED ORDER — OXYBUTYNIN CHLORIDE 5 MG PO TABS
5.0000 mg | ORAL_TABLET | Freq: Three times a day (TID) | ORAL | Status: DC | PRN
  Administered 2020-07-06 – 2020-07-07 (×2): 5 mg via ORAL
  Filled 2020-07-05 (×2): qty 1

## 2020-07-05 MED ORDER — INSULIN ASPART 100 UNIT/ML ~~LOC~~ SOLN: 0.0000 [IU] | Freq: Three times a day (TID) | SUBCUTANEOUS | Status: DC

## 2020-07-05 MED ORDER — METOCLOPRAMIDE HCL 10 MG PO TABS: 10.0000 mg | ORAL_TABLET | Freq: Four times a day (QID) | ORAL | Status: DC | PRN

## 2020-07-05 MED ORDER — KCL IN DEXTROSE-NACL 20-5-0.45 MEQ/L-%-% IV SOLN
INTRAVENOUS | Status: DC
  Filled 2020-07-05 (×4): qty 1000

## 2020-07-05 NOTE — H&P (Signed)
Urology Admission H&P  Chief Complaint: Flank pain with nausea vomiting  History of Present Illness: Patient is a 59 year old white male who underwent left ureteroscopy by Dr. Louis Meckel yesterday for left proximal ureteral calculus.  During the procedure he experienced some intraoperative bleeding and perhaps a small ureteral perforation and left JJ stent was placed.  Patient was discharged home.  He presented back to the emergency room today with severe left-sided flank pain and nausea vomiting.  He was found on CT scan to have what appears to be perinephric fluid tracking along the left retroperitoneum and some stranding around the kidney.  The hydronephrosis has resolved and the left JJ stent is in good position.  Patient was also febrile.  Will be admitted at this time to undergo IV antibiotics and for pain management. CT review: CLINICAL DATA:  Fever with renal stenting yesterday. Abscess/infection suspected  EXAM: CT ABDOMEN AND PELVIS WITH CONTRAST  TECHNIQUE: Multidetector CT imaging of the abdomen and pelvis was performed using the standard protocol following bolus administration of intravenous contrast.  CONTRAST:  172mL OMNIPAQUE IOHEXOL 300 MG/ML  SOLN  COMPARISON:  CT from 3 days ago  FINDINGS: Lower chest: Multiple coronary calcifications. Mild atelectasis at the right base.  Hepatobiliary: Hepatic steatosis. Negative for focal lesion.No evidence of biliary obstruction or stone.  Pancreas: Unremarkable.  Spleen: Unremarkable.  Adrenals/Urinary Tract: Negative adrenals. Interval left ureteral stenting with improved hydronephrosis. No residual hydroureter. A left ureteral stent is seen with expected course. The stent is next to a stone at the upper ureter measuring 6 mm. Patchy gas is seen around the left ureter to the level of the left renal hilum. Small volume gas within the collecting system, seen at the upper pole and anti dependent bladder. A small  bladder calculus is again noted. There is prominent retroperitoneal edema in the perinephric space especially below the kidney. The fluid is low-density. No visible extravasation of urine on the delayed phase. Multiple bilateral renal calculi measuring up to 1 cm on the right.  Stomach/Bowel: Stranding at the left colonic mesentery is considered secondary. No bowel wall thickening. No evidence of bowel obstruction. There is generalized moderate stool retention.  Vascular/Lymphatic: No acute vascular abnormality. No mass or adenopathy.  Reproductive:No pathologic findings.  Other: No ascites or pneumoperitoneum.  Fatty midline hernia.  Musculoskeletal: No acute abnormalities.  IMPRESSION: 1. Improved left hydronephrosis after ureteral stenting. The offending ureteral stone is present at the upper ureter. 2. Extensive gas tracking along the left ureter which could be postprocedural or from emphysematous infection. 3. Prominent degree of retroperitoneal fluid in the lower left perirenal space and tracking into the pelvis. Urine extravasation was not seen on the delayed phase through the kidneys. A delayed CT through the ureters could be obtained within the next few hours to determine the presence of ureteral extravasation. 4. No drainable collection. 5. Hepatic steatosis and extensive coronary atherosclerosis.   Past Medical History:  Diagnosis Date  . Arthritis   . BURSITIS, LEFT HIP 05/20/2010  . Chronic kidney disease    stones; 13 stones, currently has 2 stones   . Claustrophobia    "take Lorazepam if I have to fly"  . DIAB W/O COMP TYPE II/UNS NOT STATED UNCNTRL 03/30/2009  . ELEVATED BLOOD PRESSURE 03/30/2009  . Fissure, anal    occ bleeds still  . Headache(784.0)    "only when I was working; had headaches and migraines"  . History of hiatal hernia   . Hyperlipidemia 10/11/2010  .  Insomnia   . Irregular heart beat   . Numbness and tingling of both feet   .  Pancreatic carcinoma ([place]) 2010  . SHOULDER PAIN, LEFT 10/20/2008  . Skin cancer    Past Surgical History:  Procedure Laterality Date  . ANAL FISSURE REPAIR  07/28/2011   Procedure: ANAL FISSURE REPAIR;  Surgeon: Adin Hector, MD;  Location: [facility];  Service: General;  Laterality: N/A;  Repair Anal Fissure/sphinterotomy and excision rectal polypl  . BACK SURGERY  2000   "had 2 cages put in"  . BIOPSY PANCREAS  2010   benign  . CYSTOSCOPY/URETEROSCOPY/HOLMIUM LASER/STENT PLACEMENT Left 07/04/2020   Procedure: CYSTOSCOPY LEFT RETROGRADE URETEROSCOPY/HOLMIUM LASER/STENT PLACEMENT;  Surgeon: Ardis Hughs, MD;  Location: [facility];  Service: Urology;  Laterality: Left;  . HERNIA REPAIR  0277   umbilical  . KIDNEY STONES  2010 & 2008 & 2005  . LAPAROSCOPIC INCISIONAL / UMBILICAL / VENTRAL HERNIA REPAIR  07/08/11   VHR  . left rotator cuff  02/26/2011  . left shoulder bone spurs  2011  . LIPOMA EXCISION     "several; off back, arms"  . pancreatic tumor  2010  . right rotator cuff     2011  . TONSILLECTOMY  1967  . TOTAL KNEE ARTHROPLASTY Left 04/28/2017   Procedure: LEFT TOTAL KNEE ARTHROPLASTY;  Surgeon: Paralee Cancel, MD;  Location: [facility];  Service: Orthopedics;  Laterality: Left;  Marland Kitchen VASECTOMY  1994  . VENTRAL HERNIA REPAIR  07/08/2011   Procedure: LAPAROSCOPIC VENTRAL HERNIA;  Surgeon: Adin Hector, MD;  Location: [facility];  Service: General;  Laterality: N/A;  Laparoscopic repair ventral hernia with mesh, Lysis of adhesions.    Home Medications:  Current Facility-Administered Medications  Medication Dose Route Frequency Provider Last Rate Last Admin  . [START ON 07/06/2020] cefTRIAXone (ROCEPHIN) 2 g in sodium chloride 0.9 % 100 mL IVPB  2 g Intravenous Q24H Kyle, Tyrone A, DO      . lactulose (CHRONULAC) 10 GM/15ML solution 20 g  20 g Oral BID Marylyn Ishihara, Tyrone A, DO       Allergies:  Allergies  Allergen Reactions  . Merthiolate [Thimerosol]     Unknown reaction  . Morphine      REACTION: paronia  . Nickel Dermatitis    Family History  Problem Relation Age of Onset  . Diabetes Brother   . Stroke Father   . Heart disease Father   . Heart attack Father 5  . Cancer Maternal Grandfather        colon  . Colon cancer Maternal Grandfather 66   Social History:  reports that he has never smoked. He has never used smokeless tobacco. He reports that he does not drink alcohol and does not use drugs.  Review of Systems  Physical Exam:  Vital signs in last 24 hours: Temp:  [98 F (36.7 C)-98.9 F (37.2 C)] 98 F (36.7 C) (12/02 1559) Pulse Rate:  [73-121] 87 (12/02 1606) Resp:  [7-26] 16 (12/02 1559) BP: (102-175)/(70-102) 153/87 (12/02 1606) SpO2:  [92 %-98 %] 98 % (12/02 1606) Weight:  [115.2 kg] 115.2 kg (12/02 0457) Physical Exam  Heart RRR Lung CTA AJO:INOMVEHM LLQ tebderness, no rebound tenderness  Laboratory Data:  Results for orders placed or performed during the hospital encounter of 07/05/20 (from the past 24 hour(s))  Urinalysis, Routine w reflex microscopic Urine, Clean Catch     Status: Abnormal   Collection Time: 07/05/20  5:04 AM  Result Value  Ref Range   Color, Urine AMBER (A) YELLOW   APPearance TURBID (A) CLEAR   Specific Gravity, Urine 1.025 1.005 - 1.030   pH 5.0 5.0 - 8.0   Glucose, UA >=500 (A) NEGATIVE mg/dL   Hgb urine dipstick LARGE (A) NEGATIVE   Bilirubin Urine SMALL (A) NEGATIVE   Ketones, ur 40 (A) NEGATIVE mg/dL   Protein, ur 100 (A) NEGATIVE mg/dL   Nitrite POSITIVE (A) NEGATIVE   Leukocytes,Ua TRACE (A) NEGATIVE   RBC / HPF >50 (H) 0 - 5 RBC/hpf   WBC, UA 6-10 0 - 5 WBC/hpf   Bacteria, UA RARE (A) NONE SEEN  CBC with Differential     Status: Abnormal   Collection Time: 07/05/20  5:08 AM  Result Value Ref Range   WBC 10.9 (H) 4.0 - 10.5 K/uL   RBC 4.93 4.22 - 5.81 MIL/uL   Hemoglobin 15.2 13.0 - 17.0 g/dL   HCT 44.8 39 - 52 %   MCV 90.9 80.0 - 100.0 fL   MCH 30.8 26.0 - 34.0 pg   MCHC 33.9 30.0 - 36.0 g/dL    RDW 12.2 11.5 - 15.5 %   Platelets 240 150 - 400 K/uL   nRBC 0.0 0.0 - 0.2 %   Neutrophils Relative % 89 %   Neutro Abs 9.6 (H) 1.7 - 7.7 K/uL   Lymphocytes Relative 6 %   Lymphs Abs 0.7 0.7 - 4.0 K/uL   Monocytes Relative 4 %   Monocytes Absolute 0.5 0.1 - 1.0 K/uL   Eosinophils Relative 0 %   Eosinophils Absolute 0.0 0.0 - 0.5 K/uL   Basophils Relative 0 %   Basophils Absolute 0.0 0.0 - 0.1 K/uL   Immature Granulocytes 1 %   Abs Immature Granulocytes 0.14 (H) 0.00 - 0.07 K/uL  Comprehensive metabolic panel     Status: Abnormal   Collection Time: 07/05/20  5:08 AM  Result Value Ref Range   Sodium 137 135 - 145 mmol/L   Potassium 4.5 3.5 - 5.1 mmol/L   Chloride 104 98 - 111 mmol/L   CO2 21 (L) 22 - 32 mmol/L   Glucose, Bld 166 (H) 70 - 99 mg/dL   BUN 21 (H) 6 - 20 mg/dL   Creatinine, Ser 0.70 0.61 - 1.24 mg/dL   Calcium 9.1 8.9 - 10.3 mg/dL   Total Protein 7.4 6.5 - 8.1 g/dL   Albumin 4.3 3.5 - 5.0 g/dL   AST 16 15 - 41 U/L   ALT 12 0 - 44 U/L   Alkaline Phosphatase 49 38 - 126 U/L   Total Bilirubin 1.2 0.3 - 1.2 mg/dL   GFR, Estimated >60 >60 mL/min   Anion gap 12 5 - 15  Lactic acid, plasma     Status: None   Collection Time: 07/05/20  5:08 AM  Result Value Ref Range   Lactic Acid, Venous 1.1 0.5 - 1.9 mmol/L  Lipase, blood     Status: None   Collection Time: 07/05/20  9:20 AM  Result Value Ref Range   Lipase 28 11 - 51 U/L   Recent Results (from the past 240 hour(s))  SARS CORONAVIRUS 2 (TAT 6-24 HRS) Nasopharyngeal Nasopharyngeal Swab     Status: None   Collection Time: 07/03/20 12:14 PM   Specimen: Nasopharyngeal Swab  Result Value Ref Range Status   SARS Coronavirus 2 NEGATIVE NEGATIVE Final    Comment: (NOTE) SARS-CoV-2 target nucleic acids are NOT DETECTED.  The SARS-CoV-2 RNA is generally detectable  in upper and lower respiratory specimens during the acute phase of infection. Negative results do not preclude SARS-CoV-2 infection, do not rule  out co-infections with other pathogens, and should not be used as the sole basis for treatment or other patient management decisions. Negative results must be combined with clinical observations, patient history, and epidemiological information. The expected result is Negative.  Fact Sheet for Patients: SugarRoll.be  Fact Sheet for Healthcare Providers: https://www.woods-mathews.com/  This test is not yet approved or cleared by the [place] FDA and  has been authorized for detection and/or diagnosis of SARS-CoV-2 by FDA under an Emergency Use Authorization (EUA). This EUA will remain  in effect (meaning this test can be used) for the duration of the COVID-19 declaration under Se ction 564(b)(1) of the Act, 21 U.S.C. section 360bbb-3(b)(1), unless the authorization is terminated or revoked sooner.  Performed at [facility], [place] [address] [place], [place] [address]    Creatinine: Recent Labs    07/02/20 1934 07/05/20 0508  CREATININE 1.09 0.70   Baseline Creatinine:   Impression/Assessment:  Left flank pain and probable extravasation following recent ureteroscopic procedure but left JJ stent in good position  Plan:  Admit for IV antibiotics and pain management.  Start Rocephin 1 g IV every 24 hours.  No surgical intervention needed at this point as stent is in good position.  Remi Haggard 07/05/2020, 5:07 PM

## 2020-07-05 NOTE — ED Notes (Signed)
Spoke to lab. Lab states they will pull urine culture from sample in lab already.

## 2020-07-05 NOTE — ED Notes (Signed)
Pt ambulatory to bathroom

## 2020-07-05 NOTE — ED Notes (Signed)
Family at bedside. 

## 2020-07-05 NOTE — ED Provider Notes (Signed)
[facility] DEPT Provider Note   CSN: [id number] Arrival date & time: 07/05/20  0446     History Chief Complaint  Patient presents with  . Post-op Problem    [name] is a 59 y.o. male.  The history is provided by the patient, the spouse and medical records. No language interpreter was used.  Flank Pain This is a new problem. The current episode started more than 2 days ago. The problem occurs constantly. The problem has not changed since onset.Associated symptoms include abdominal pain. Pertinent negatives include no chest pain, no headaches and no shortness of breath. Nothing (palpation and urination) aggravates the symptoms. Nothing relieves the symptoms. He has tried nothing for the symptoms. The treatment provided no relief.       Past Medical History:  Diagnosis Date  . Arthritis   . BURSITIS, LEFT HIP 05/20/2010  . Chronic kidney disease    stones; 13 stones, currently has 2 stones   . Claustrophobia    "take Lorazepam if I have to fly"  . DIAB W/O COMP TYPE II/UNS NOT STATED UNCNTRL 03/30/2009  . ELEVATED BLOOD PRESSURE 03/30/2009  . Fissure, anal    occ bleeds still  . Headache(784.0)    "only when I was working; had headaches and migraines"  . History of hiatal hernia   . Hyperlipidemia 10/11/2010  . Insomnia   . Irregular heart beat   . Numbness and tingling of both feet   . Pancreatic carcinoma ([place]) 2010  . SHOULDER PAIN, LEFT 10/20/2008  . Skin cancer     Patient Active Problem List   Diagnosis Date Noted  . S/P left TKA 04/28/2017  . S/P total knee replacement 04/28/2017  . Situational anxiety 03/17/2016  . Low testosterone 03/13/2015  . Osteoarthritis, multiple sites 08/22/2013  . Obesity (BMI 30-39.9) 05/08/2013  . 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  . Liver nodule 10/07/2012  . History of kidney stones 08/27/2012  . 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 Rotator cuff tear, left 01/23/2011  .  Hyperlipidemia 10/11/2010  . BURSITIS, LEFT HIP 05/20/2010  . Controlled type 2 diabetes mellitus without complication ([place]) 94/85/4627  . CHRONIC TENSION TYPE HEADACHE 03/30/2009  . ELEVATED BLOOD PRESSURE 03/30/2009  . SHOULDER PAIN, LEFT 10/20/2008  . CONTUSION OF KNEE 10/20/2008  . ABNORMAL FINDINGS GI TRACT 06/23/2008    Past Surgical History:  Procedure Laterality Date  . ANAL FISSURE REPAIR  07/28/2011   Procedure: ANAL FISSURE REPAIR;  Surgeon: Adin Hector, MD;  Location: [facility];  Service: General;  Laterality: N/A;  Repair Anal Fissure/sphinterotomy and excision rectal polypl  . BACK SURGERY  2000   "had 2 cages put in"  . BIOPSY PANCREAS  2010   benign  . HERNIA REPAIR  0350   umbilical  . KIDNEY STONES  2010 & 2008 & 2005  . LAPAROSCOPIC INCISIONAL / UMBILICAL / VENTRAL HERNIA REPAIR  07/08/11   [facility]  . left rotator cuff  02/26/2011  . left shoulder bone spurs  2011  . LIPOMA EXCISION     "several; off back, arms"  . pancreatic tumor  2010  . right rotator cuff     2011  . TONSILLECTOMY  1967  . TOTAL KNEE ARTHROPLASTY Left 04/28/2017   Procedure: LEFT TOTAL KNEE ARTHROPLASTY;  Surgeon: Paralee Cancel, MD;  Location: [facility];  Service: Orthopedics;  Laterality: Left;  Marland Kitchen VASECTOMY  1994  . VENTRAL HERNIA REPAIR  07/08/2011   Procedure: LAPAROSCOPIC VENTRAL  HERNIA;  Surgeon: Adin Hector, MD;  Location: [facility];  Service: General;  Laterality: N/A;  Laparoscopic repair ventral hernia with mesh, Lysis of adhesions.       Family History  Problem Relation Age of Onset  . Diabetes Brother   . Stroke Father   . Heart disease Father   . Heart attack Father 1  . Cancer Maternal Grandfather        colon  . Colon cancer Maternal Grandfather 56    Social History   Tobacco Use  . Smoking status: Never Smoker  . Smokeless tobacco: Never Used  Vaping Use  . Vaping Use: Never used  Substance Use Topics  . Alcohol use: No    Comment: 07/08/11 "maybe 2-3 times a  year"  . Drug use: No    Home Medications Prior to Admission medications   Medication Sig Start Date End Date Taking? Authorizing Provider  amoxicillin (AMOXIL) 500 MG capsule Take 4 capsules (2,000 mg) 1 hour before dental cleaning. 03/14/19   Burchette, Alinda Sierras, MD  cetirizine (ZYRTEC) 10 MG tablet Take 10 mg by mouth as needed.     [provider]  cyclobenzaprine (FLEXERIL) 10 MG tablet TAKE ONE TABLET BY MOUTH EVERY 8 HOURS AS NEEDED FOR MUSCLE SPASM 03/30/20   Burchette, Alinda Sierras, MD  diphenhydrAMINE (BENADRYL) 50 MG capsule Take 50 mg by mouth every 6 (six) hours as needed.    [provider]  empagliflozin (JARDIANCE) 10 MG TABS tablet Take 10 mg by mouth daily. 08/08/19   Burchette, Alinda Sierras, MD  eszopiclone (LUNESTA) 2 MG TABS tablet Take 1 tablet (2 mg total) by mouth at bedtime as needed for sleep. Take immediately before bedtime 06/27/20   Burchette, Alinda Sierras, MD  gabapentin (NEURONTIN) 100 MG capsule Take 1 capsule (100 mg total) by mouth 3 (three) times daily. 06/25/20   Wallene Huh, DPM  glucose blood (CONTOUR NEXT TEST) test strip Use twice daily for glucose control.  Dx E11.9 05/04/17   [name], MD  Lancet Device MISC Contour next test lancets.  Test once daily.  Dx E11.9 04/08/17   Burchette, Alinda Sierras, MD  LORazepam (ATIVAN) 1 MG tablet TAKE ONE TABLET BY MOUTH TWICE A DAY AS NEEDED FOR ANXIETY 06/07/20   Burchette, Alinda Sierras, MD  metFORMIN (GLUCOPHAGE-XR) 500 MG 24 hr tablet TAKE 2 TABLETS BY MOUTH twice daily. 08/08/19   Burchette, Alinda Sierras, MD  metoCLOPramide (REGLAN) 10 MG tablet Take 1 tablet (10 mg total) by mouth every 6 (six) hours. 07/03/20   McDonald, Mia A, PA-C  Multiple Vitamins-Minerals (CENTRUM PO) Take 1 tablet by mouth daily.     [provider]  OVER THE COUNTER MEDICATION Melatonin 15 mg at bedtime    [provider]  oxyCODONE (ROXICODONE) 5 MG immediate release tablet Take 1 tablet (5 mg total) by mouth every 6 (six) hours  as needed for severe pain. 07/04/20   Ardis Hughs, MD  phenazopyridine (PYRIDIUM) 200 MG tablet Take 1 tablet (200 mg total) by mouth 3 (three) times daily as needed for pain. 07/04/20   Ardis Hughs, MD  potassium citrate (UROCIT-K) 10 MEQ (1080 MG) SR tablet TAKE 1 TABLET BY MOUTH TWO TIMES A DAY... 08/08/19   Burchette, Alinda Sierras, MD  rosuvastatin (CRESTOR) 20 MG tablet Take 1 tablet (20 mg total) by mouth daily. 05/04/20   Jerline Pain, MD  tamsulosin (FLOMAX) 0.4 MG CAPS capsule Take 1 capsule (0.4  mg total) by mouth daily. 07/03/20   McDonald, Mia A, PA-C    Allergies    Merthiolate [thimerosol], Morphine, and Nickel  Review of Systems   Review of Systems  Constitutional: Positive for chills, fatigue and fever. Negative for diaphoresis.  HENT: Negative for congestion.   Eyes: Negative for visual disturbance.  Respiratory: Negative for cough, chest tightness and shortness of breath.   Cardiovascular: Negative for chest pain.  Gastrointestinal: Positive for abdominal pain, constipation and nausea. Negative for diarrhea and vomiting.  Genitourinary: Positive for dysuria, flank pain and hematuria.  Musculoskeletal: Positive for back pain. Negative for neck pain and neck stiffness.  Skin: Negative for rash and wound.  Neurological: Negative for weakness, light-headedness, numbness and headaches.  Psychiatric/Behavioral: Negative for agitation.  All other systems reviewed and are negative.   Physical Exam Updated Vital Signs BP (!) 152/96   Pulse 81   Temp 98.9 F (37.2 C) (Oral)   Resp 19   Ht 6' 1.5" (1.867 m)   Wt 115.2 kg   SpO2 98%   BMI 33.06 kg/m   Physical Exam Vitals and nursing note reviewed.  Constitutional:      General: He is not in acute distress.    Appearance: He is well-developed. He is ill-appearing. He is not toxic-appearing or diaphoretic.  HENT:     Head: Normocephalic and atraumatic.     Nose: No congestion or rhinorrhea.      Mouth/Throat:     Pharynx: No oropharyngeal exudate or posterior oropharyngeal erythema.  Eyes:     Extraocular Movements: Extraocular movements intact.     Conjunctiva/sclera: Conjunctivae normal.     Pupils: Pupils are equal, round, and reactive to light.  Cardiovascular:     Rate and Rhythm: Regular rhythm. Tachycardia present.     Heart sounds: No murmur heard.   Pulmonary:     Effort: Pulmonary effort is normal. No respiratory distress.     Breath sounds: Normal breath sounds.  Abdominal:     General: Abdomen is flat.     Palpations: Abdomen is soft.     Tenderness: There is generalized abdominal tenderness. There is left CVA tenderness. There is no right CVA tenderness, guarding or rebound.       Comments: Generalized mild abdominal tenderness but focus of tenderness in the left abdomen and left lower quadrant and left flank.  Also left CVA tenderness.  Musculoskeletal:        General: Tenderness present.     Cervical back: Neck supple. No tenderness.       Back:     Right lower leg: No edema.     Left lower leg: No edema.  Skin:    General: Skin is warm and dry.     Capillary Refill: Capillary refill takes less than 2 seconds.     Findings: No erythema.  Neurological:     General: No focal deficit present.     Mental Status: He is alert.  Psychiatric:        Mood and Affect: Mood normal.     ED Results / Procedures / Treatments   Labs (all labs ordered are listed, but only abnormal results are displayed) Labs Reviewed  CBC WITH DIFFERENTIAL/PLATELET - Abnormal; Notable for the following components:      Result Value   WBC 10.9 (*)    Neutro Abs 9.6 (*)    Abs Immature Granulocytes 0.14 (*)    All other components within normal limits  COMPREHENSIVE METABOLIC  PANEL - Abnormal; Notable for the following components:   CO2 21 (*)    Glucose, Bld 166 (*)    BUN 21 (*)    All other components within normal limits  URINALYSIS, ROUTINE W REFLEX MICROSCOPIC -  Abnormal; Notable for the following components:   Color, Urine AMBER (*)    APPearance TURBID (*)    Glucose, UA >=500 (*)    Hgb urine dipstick LARGE (*)    Bilirubin Urine SMALL (*)    Ketones, ur 40 (*)    Protein, ur 100 (*)    Nitrite POSITIVE (*)    Leukocytes,Ua TRACE (*)    RBC / HPF >50 (*)    Bacteria, UA RARE (*)    All other components within normal limits  CULTURE, BLOOD (ROUTINE X 2)  CULTURE, BLOOD (ROUTINE X 2)  URINE CULTURE  LACTIC ACID, PLASMA  LIPASE, BLOOD    EKG None  Radiology CT ABDOMEN PELVIS W CONTRAST  Result Date: 07/05/2020 CLINICAL DATA:  Fever with renal stenting yesterday. Abscess/infection suspected EXAM: CT ABDOMEN AND PELVIS WITH CONTRAST TECHNIQUE: Multidetector CT imaging of the abdomen and pelvis was performed using the standard protocol following bolus administration of intravenous contrast. CONTRAST:  182mL OMNIPAQUE IOHEXOL 300 MG/ML  SOLN COMPARISON:  CT from 3 days ago FINDINGS: Lower chest: Multiple coronary calcifications. Mild atelectasis at the right base. Hepatobiliary: Hepatic steatosis. Negative for focal lesion.No evidence of biliary obstruction or stone. Pancreas: Unremarkable. Spleen: Unremarkable. Adrenals/Urinary Tract: Negative adrenals. Interval left ureteral stenting with improved hydronephrosis. No residual hydroureter. A left ureteral stent is seen with expected course. The stent is next to a stone at the upper ureter measuring 6 mm. Patchy gas is seen around the left ureter to the level of the left renal hilum. Small volume gas within the collecting system, seen at the upper pole and anti dependent bladder. A small bladder calculus is again noted. There is prominent retroperitoneal edema in the perinephric space especially below the kidney. The fluid is low-density. No visible extravasation of urine on the delayed phase. Multiple bilateral renal calculi measuring up to 1 cm on the right. Stomach/Bowel: Stranding at the left  colonic mesentery is considered secondary. No bowel wall thickening. No evidence of bowel obstruction. There is generalized moderate stool retention. Vascular/Lymphatic: No acute vascular abnormality. No mass or adenopathy. Reproductive:No pathologic findings. Other: No ascites or pneumoperitoneum.  Fatty midline hernia. Musculoskeletal: No acute abnormalities. IMPRESSION: 1. Improved left hydronephrosis after ureteral stenting. The offending ureteral stone is present at the upper ureter. 2. Extensive gas tracking along the left ureter which could be postprocedural or from emphysematous infection. 3. Prominent degree of retroperitoneal fluid in the lower left perirenal space and tracking into the pelvis. Urine extravasation was not seen on the delayed phase through the kidneys. A delayed CT through the ureters could be obtained within the next few hours to determine the presence of ureteral extravasation. 4. No drainable collection. 5. Hepatic steatosis and extensive coronary atherosclerosis. Electronically Signed   By: Monte Fantasia M.D.   On: 07/05/2020 11:16   DG C-Arm 1-60 Min-No Report  Result Date: 07/04/2020 Fluoroscopy was utilized by the requesting physician.  No radiographic interpretation.    Procedures Procedures (including critical care time)  CRITICAL CARE Performed by: Gwenyth Allegra Silvana Holecek Total critical care time: 35 minutes Critical care time was exclusive of separately billable procedures and treating other patients. Critical care was necessary to treat or prevent imminent or life-threatening deterioration. Critical care  was time spent personally by me on the following activities: development of treatment plan with patient and/or surrogate as well as nursing, discussions with consultants, evaluation of patient's response to treatment, examination of patient, obtaining history from patient or surrogate, ordering and performing treatments and interventions, ordering and review of  laboratory studies, ordering and review of radiographic studies, pulse oximetry and re-evaluation of patient's condition.   Medications Ordered in ED Medications  HYDROmorphone (DILAUDID) injection 1 mg (1 mg Intravenous Given 07/05/20 0918)  cefTRIAXone (ROCEPHIN) 1 g in sodium chloride 0.9 % 100 mL IVPB (0 g Intravenous Stopped 07/05/20 1026)  LORazepam (ATIVAN) injection 1 mg (1 mg Intravenous Given 07/05/20 1022)  ondansetron (ZOFRAN) injection 4 mg (4 mg Intravenous Given 07/05/20 1021)  iohexol (OMNIPAQUE) 300 MG/ML solution 100 mL (100 mLs Intravenous Contrast Given 07/05/20 1038)  0.9 %  sodium chloride infusion ( Intravenous Rate/Dose Verify 07/05/20 1539)  HYDROmorphone (DILAUDID) injection 1 mg (1 mg Intravenous Given 07/05/20 1316)    ED Course  I have reviewed the triage vital signs and the nursing notes.  Pertinent labs & imaging results that were available during my care of the patient were reviewed by me and considered in my medical decision making (see chart for details).    MDM Rules/Calculators/A&P                          AMEYA KUTZ is a 59 y.o. male with a past medical history significant for pancreatic cancer status post surgery, diabetes, hyperlipidemia, hypertension, claustrophobia, and recent procedure yesterday with urology for ureteral stone destruction/removal status post stenting on the left side who presents with fevers, chills, and worsened left sided abdominal pain and flank pain.  Patient reports that yesterday, he underwent lithotripsy with laser and stent placement by Dr. Louis Meckel with urology.  He says that he was discharged home and since then has had worsened left flank pain, fever up to 101.7 overnight status post Tylenol, and he has not had any passage of flatus or bowel movement in the last few days.  He says that his pain gets up to 10 out of 10 and is currently now a 4 out of 10 in severity.  It wax and wanes.  He reports he is also having severe dysuria  and burning with urination.  He was told to come back to emergency department if symptoms worsen which they have.  On my exam, patient does have tenderness in his left CVA area and left leg.  Also left abdominal tenderness.  Some other mild diffuse abdominal tenderness but I did hear some bowel sounds.  No rash seen.  Lungs clear and chest nontender.  Patient has been in emergency department for several hours prior to my evaluation had some lab work obtained.  His urinalysis does show leukocytes, nitrites, and bacteria so I do suspect a urinary tract infection versus infected stone present.  He also has a mild leukocytosis of 10.9.  Lactic acid 1 not elevated, doubt sepsis at this time.  He was afebrile after taking Tylenol but felt warm to the touch, will recheck his temperature.  I spoke with urology who agrees with getting repeat imaging to make sure there is not some obstruction in the new stent as well as no other complications such as postoperative hematoma, abscess, perforation, urine leak or other abnormality.  We also get abdomen pelvis to make sure he is not having any bowel obstruction or other complication  in his abdomen.  We will give him antibiotics for the fever, tachycardia, and urinalysis showing infection.  We will give Rocephin.  Will give more pain medicine.  Urology will come to the patient when work-up is completed.  Anticipate reassessment after work-up.  Imaging returned showing concern for prominent retroperitoneal fluid in the left lower pararenal space tracking the pelvis which may be postsurgical changes and fluid versus infection versus extravasation of urine.  There was also gas tracking along the ureter which could be postprocedural versus emphysematous infection.  I spoke with urology again who reviewed the images.  They suspect this is more postprocedural and may be some extravasation but the treatment has already been performed with a stent.  They did not see any pockets  of fluid to drain and do not feel he needs any surgical intervention at this time.  They request admission to the medicine service for likely pyelonephritis with some inflammatory changes on imaging.  They will see the patient in follow-up and continue to consult.  Medical team called for admission for further management.   Medical team discussed with urology who will admit the patient for further management.    Final Clinical Impression(s) / ED Diagnoses Final diagnoses:  Left flank pain  Pyelonephritis     Clinical Impression: 1. Left flank pain   2. Pyelonephritis     Disposition: Admit  This note was prepared with assistance of Dragon voice recognition software. Occasional wrong-word or sound-a-like substitutions may have occurred due to the inherent limitations of voice recognition software.     Falana Clagg, Gwenyth Allegra, MD 07/05/20 [phone]

## 2020-07-05 NOTE — ED Triage Notes (Signed)
Pt arrives woth c/o fever of 101.7 at home. Took 1000mg  tylenol, and oxycodone at home at approx 0330. Pt seen yesterday and had stents placed. Pt began having a lot of blood clots and could not complete cystoscopy. Able to void.

## 2020-07-05 NOTE — Consult Note (Signed)
Medical Consultation  Caisen Mangas Stecklein NUU:[id number] DOB: 03-04-1961 DOA: 07/05/2020 PCP: [name], MD   Requesting physician: Milford Cage Date of consultation: 07/05/20 Reason for consultation: Med management  Impression/Recommendations Pyelonephritis     - admitted to urology     - will start rocephin, he's afebrile since he's been here and his vitals are ok     - anti-emetics, pain control   Nephrolithiasis Hematuria     - per primary team  DM2     - SSI, DM2 diet, glucose checks  Constipation     - CT w/ moderate stool burden     - lactulose  TRH will follow-up again tomorrow. Please contact me if I can be of assistance in the meanwhile. Thank you for this consultation.  Chief Complaint: flank pain, hematuria  HPI:  [name] is a 59 y.o. male with medical history significant of nephrolithiasis. Presenting with flank pain. 4 days ago, he came to the ED because of flank pain. He was noted to have a kidney stone and he was sent for urology follow up. He was recommended to have lithotripsy performed and that took place yesterday. He has intraoperative bleeding that prevented completion of the procedure. A stent was placed and he was allowed to go home. Patient got home and he started having lots of ab pain and pain with urination. His pain meds were not efective.  He woke up this morning with a fever 101.7. When he saw the fever, he decided that he needed to come back to the ED.      Review of Systems:  Denies CP, palpitations, dyspnea, N/V. Remainder of ROS is negative for all not mentioned in HPI.  Past Medical History:  Diagnosis Date  . Arthritis   . BURSITIS, LEFT HIP 05/20/2010  . Chronic kidney disease    stones; 13 stones, currently has 2 stones   . Claustrophobia    "take Lorazepam if I have to fly"  . DIAB W/O COMP TYPE II/UNS NOT STATED UNCNTRL 03/30/2009  . ELEVATED BLOOD PRESSURE 03/30/2009  . Fissure, anal    occ bleeds still  . Headache(784.0)     "only when I was working; had headaches and migraines"  . History of hiatal hernia   . Hyperlipidemia 10/11/2010  . Insomnia   . Irregular heart beat   . Numbness and tingling of both feet   . Pancreatic carcinoma ([place]) 2010  . SHOULDER PAIN, LEFT 10/20/2008  . Skin cancer    Past Surgical History:  Procedure Laterality Date  . ANAL FISSURE REPAIR  07/28/2011   Procedure: ANAL FISSURE REPAIR;  Surgeon: Adin Hector, MD;  Location: [facility];  Service: General;  Laterality: N/A;  Repair Anal Fissure/sphinterotomy and excision rectal polypl  . BACK SURGERY  2000   "had 2 cages put in"  . BIOPSY PANCREAS  2010   benign  . CYSTOSCOPY/URETEROSCOPY/HOLMIUM LASER/STENT PLACEMENT Left 07/04/2020   Procedure: CYSTOSCOPY LEFT RETROGRADE URETEROSCOPY/HOLMIUM LASER/STENT PLACEMENT;  Surgeon: Ardis Hughs, MD;  Location: [facility];  Service: Urology;  Laterality: Left;  . HERNIA REPAIR  3474   umbilical  . KIDNEY STONES  2010 & 2008 & 2005  . LAPAROSCOPIC INCISIONAL / UMBILICAL / VENTRAL HERNIA REPAIR  07/08/11   VHR  . left rotator cuff  02/26/2011  . left shoulder bone spurs  2011  . LIPOMA EXCISION     "several; off back, arms"  . pancreatic tumor  2010  . right rotator  cuff     2011  . TONSILLECTOMY  1967  . TOTAL KNEE ARTHROPLASTY Left 04/28/2017   Procedure: LEFT TOTAL KNEE ARTHROPLASTY;  Surgeon: Paralee Cancel, MD;  Location: [facility];  Service: Orthopedics;  Laterality: Left;  Marland Kitchen VASECTOMY  1994  . VENTRAL HERNIA REPAIR  07/08/2011   Procedure: LAPAROSCOPIC VENTRAL HERNIA;  Surgeon: Adin Hector, MD;  Location: [facility];  Service: General;  Laterality: N/A;  Laparoscopic repair ventral hernia with mesh, Lysis of adhesions.   Social History:  reports that he has never smoked. He has never used smokeless tobacco. He reports that he does not drink alcohol and does not use drugs.  Allergies  Allergen Reactions  . Merthiolate [Thimerosol]     Unknown reaction  . Morphine     REACTION:  paronia  . Nickel Dermatitis   Family History  Problem Relation Age of Onset  . Diabetes Brother   . Stroke Father   . Heart disease Father   . Heart attack Father 52  . Cancer Maternal Grandfather        colon  . Colon cancer Maternal Grandfather 48    Prior to Admission medications   Medication Sig Start Date End Date Taking? Authorizing Provider  acetaminophen (TYLENOL) 500 MG tablet Take 1,000 mg by mouth every 6 (six) hours as needed for fever.   Yes [provider]  amoxicillin (AMOXIL) 500 MG capsule Take 4 capsules (2,000 mg) 1 hour before dental cleaning. 03/14/19  Yes Burchette, Alinda Sierras, MD  bisacodyl (DULCOLAX) 10 MG suppository Place 10 mg rectally as needed for moderate constipation.   Yes [provider]  cetirizine (ZYRTEC) 10 MG tablet Take 10 mg by mouth as needed for allergies.    Yes [provider]  diphenhydrAMINE (BENADRYL) 25 MG tablet Take 75 mg by mouth every 6 (six) hours as needed for allergies.    Yes [provider]  empagliflozin (JARDIANCE) 10 MG TABS tablet Take 10 mg by mouth daily. 08/08/19  Yes Burchette, Alinda Sierras, MD  eszopiclone (LUNESTA) 2 MG TABS tablet Take 1 tablet (2 mg total) by mouth at bedtime as needed for sleep. Take immediately before bedtime 06/27/20  Yes Burchette, Alinda Sierras, MD  gabapentin (NEURONTIN) 100 MG capsule Take 1 capsule (100 mg total) by mouth 3 (three) times daily. Patient taking differently: Take 100 mg by mouth at bedtime.  06/25/20  Yes Regal, Tamala Fothergill, DPM  LORazepam (ATIVAN) 1 MG tablet TAKE ONE TABLET BY MOUTH TWICE A DAY AS NEEDED FOR ANXIETY Patient taking differently: Take 1 mg by mouth 2 (two) times daily as needed for anxiety.  06/07/20  Yes Burchette, Alinda Sierras, MD  Melatonin 10 MG TABS Take 15 mg by mouth at bedtime as needed (sleep).   Yes [provider]  metFORMIN (GLUCOPHAGE-XR) 500 MG 24 hr tablet TAKE 2 TABLETS BY MOUTH twice daily. Patient taking differently: Take 1,000  mg by mouth in the morning and at bedtime.  08/08/19  Yes Burchette, Alinda Sierras, MD  metoCLOPramide (REGLAN) 10 MG tablet Take 1 tablet (10 mg total) by mouth every 6 (six) hours. Patient taking differently: Take 10 mg by mouth every 6 (six) hours as needed for nausea.  07/03/20  Yes McDonald, Mia A, PA-C  Multiple Vitamins-Minerals (CENTRUM PO) Take 1 tablet by mouth daily.    Yes [provider]  OVER THE COUNTER MEDICATION Melatonin 15 mg at bedtime   Yes [provider]  oxyCODONE (ROXICODONE) 5 MG  immediate release tablet Take 1 tablet (5 mg total) by mouth every 6 (six) hours as needed for severe pain. 07/04/20  Yes Ardis Hughs, MD  phenazopyridine (PYRIDIUM) 200 MG tablet Take 1 tablet (200 mg total) by mouth 3 (three) times daily as needed for pain. 07/04/20  Yes Ardis Hughs, MD  potassium citrate (UROCIT-K) 10 MEQ (1080 MG) SR tablet TAKE 1 TABLET BY MOUTH TWO TIMES A DAY... Patient taking differently: Take 10 mEq by mouth in the morning and at bedtime.  08/08/19  Yes Burchette, Alinda Sierras, MD  rosuvastatin (CRESTOR) 20 MG tablet Take 1 tablet (20 mg total) by mouth daily. Patient taking differently: Take 20 mg by mouth at bedtime.  05/04/20  Yes Jerline Pain, MD  tamsulosin (FLOMAX) 0.4 MG CAPS capsule Take 1 capsule (0.4 mg total) by mouth daily. 07/03/20  Yes McDonald, Mia A, PA-C  aspirin EC 81 MG tablet Take 81 mg by mouth daily. Swallow whole.    [provider]  celecoxib (CELEBREX) 200 MG capsule Take 200 mg by mouth 2 (two) times daily.    [provider]  cyclobenzaprine (FLEXERIL) 10 MG tablet TAKE ONE TABLET BY MOUTH EVERY 8 HOURS AS NEEDED FOR MUSCLE SPASM Patient taking differently: Take 10 mg by mouth every 8 (eight) hours as needed for muscle spasms.  03/30/20   Burchette, Alinda Sierras, MD  glucose blood (CONTOUR NEXT TEST) test strip Use twice daily for glucose control.  Dx E11.9 05/04/17   [name], MD  Lancet Device MISC Contour  next test lancets.  Test once daily.  Dx E11.9 04/08/17   [name], MD   Physical Exam: Blood pressure 136/79, pulse 82, temperature 98.9 F (37.2 C), temperature source Oral, resp. rate 14, height 6' 1.5" (1.867 m), weight 115.2 kg, SpO2 94 %. Vitals:   07/05/20 1317 07/05/20 1430  BP: 139/81 136/79  Pulse: 84 82  Resp: 15 14  Temp:    SpO2: 94% 94%    General: 59 y.o. male resting in bed in NAD Eyes: PERRL, normal sclera ENMT: Nares patent w/o discharge, orophaynx clear, dentition normal, ears w/o discharge/lesions/ulcers Neck: Supple, trachea midline Cardiovascular: RRR, +S1, S2, no m/g/r, equal pulses throughout Respiratory: CTABL, no w/r/r, normal WOB GI: BS+, ND, LU/LLQ TTP, Left flank pain, no masses noted, no organomegaly noted MSK: No e/c/c Skin: No rashes, bruises, ulcerations noted Neuro: A&O x 3, no focal deficits Psyc: Appropriate interaction and affect, calm/cooperative  Labs on Admission:  Basic Metabolic Panel: Recent Labs  Lab 07/02/20 1934 07/05/20 0508  NA 139 137  K 4.1 4.5  CL 106 104  CO2 19* 21*  GLUCOSE 154* 166*  BUN 24* 21*  CREATININE 1.09 0.70  CALCIUM 9.4 9.1   Liver Function Tests: Recent Labs  Lab 07/05/20 0508  AST 16  ALT 12  ALKPHOS 49  BILITOT 1.2  PROT 7.4  ALBUMIN 4.3   Recent Labs  Lab 07/05/20 0920  LIPASE 28   No results for input(s): AMMONIA in the last 168 hours. CBC: Recent Labs  Lab 07/02/20 1934 07/05/20 0508  WBC 15.9* 10.9*  NEUTROABS  --  9.6*  HGB 16.1 15.2  HCT 46.2 44.8  MCV 88.2 90.9  PLT 255 240   Cardiac Enzymes: No results for input(s): CKTOTAL, CKMB, CKMBINDEX, TROPONINI in the last 168 hours. BNP: Invalid input(s): POCBNP CBG: Recent Labs  Lab 07/04/20 1351  GLUCAP 111*    Radiological Exams on Admission: CT  ABDOMEN PELVIS W CONTRAST  Result Date: 07/05/2020 CLINICAL DATA:  Fever with renal stenting yesterday. Abscess/infection suspected EXAM: CT ABDOMEN AND PELVIS WITH  CONTRAST TECHNIQUE: Multidetector CT imaging of the abdomen and pelvis was performed using the standard protocol following bolus administration of intravenous contrast. CONTRAST:  151mL OMNIPAQUE IOHEXOL 300 MG/ML  SOLN COMPARISON:  CT from 3 days ago FINDINGS: Lower chest: Multiple coronary calcifications. Mild atelectasis at the right base. Hepatobiliary: Hepatic steatosis. Negative for focal lesion.No evidence of biliary obstruction or stone. Pancreas: Unremarkable. Spleen: Unremarkable. Adrenals/Urinary Tract: Negative adrenals. Interval left ureteral stenting with improved hydronephrosis. No residual hydroureter. A left ureteral stent is seen with expected course. The stent is next to a stone at the upper ureter measuring 6 mm. Patchy gas is seen around the left ureter to the level of the left renal hilum. Small volume gas within the collecting system, seen at the upper pole and anti dependent bladder. A small bladder calculus is again noted. There is prominent retroperitoneal edema in the perinephric space especially below the kidney. The fluid is low-density. No visible extravasation of urine on the delayed phase. Multiple bilateral renal calculi measuring up to 1 cm on the right. Stomach/Bowel: Stranding at the left colonic mesentery is considered secondary. No bowel wall thickening. No evidence of bowel obstruction. There is generalized moderate stool retention. Vascular/Lymphatic: No acute vascular abnormality. No mass or adenopathy. Reproductive:No pathologic findings. Other: No ascites or pneumoperitoneum.  Fatty midline hernia. Musculoskeletal: No acute abnormalities. IMPRESSION: 1. Improved left hydronephrosis after ureteral stenting. The offending ureteral stone is present at the upper ureter. 2. Extensive gas tracking along the left ureter which could be postprocedural or from emphysematous infection. 3. Prominent degree of retroperitoneal fluid in the lower left perirenal space and tracking into the  pelvis. Urine extravasation was not seen on the delayed phase through the kidneys. A delayed CT through the ureters could be obtained within the next few hours to determine the presence of ureteral extravasation. 4. No drainable collection. 5. Hepatic steatosis and extensive coronary atherosclerosis. Electronically Signed   By: Monte Fantasia M.D.   On: 07/05/2020 11:16   DG C-Arm 1-60 Min-No Report  Result Date: 07/04/2020 Fluoroscopy was utilized by the requesting physician.  No radiographic interpretation.    Jonnie Finner DO [facility]  If 7PM-7AM, please contact night-coverage [email or web address] 07/05/2020, 2:50 PM

## 2020-07-06 ENCOUNTER — Other Ambulatory Visit: Payer: BC Managed Care – PPO

## 2020-07-06 DIAGNOSIS — N201 Calculus of ureter: Secondary | ICD-10-CM | POA: Diagnosis not present

## 2020-07-06 DIAGNOSIS — N12 Tubulo-interstitial nephritis, not specified as acute or chronic: Secondary | ICD-10-CM | POA: Diagnosis not present

## 2020-07-06 LAB — COMPREHENSIVE METABOLIC PANEL
ALT: 9 U/L (ref 0–44)
AST: 12 U/L — ABNORMAL LOW (ref 15–41)
Albumin: 4 g/dL (ref 3.5–5.0)
Alkaline Phosphatase: 45 U/L (ref 38–126)
Anion gap: 11 (ref 5–15)
BUN: 19 mg/dL (ref 6–20)
CO2: 25 mmol/L (ref 22–32)
Calcium: 8.7 mg/dL — ABNORMAL LOW (ref 8.9–10.3)
Chloride: 103 mmol/L (ref 98–111)
Creatinine, Ser: 0.86 mg/dL (ref 0.61–1.24)
GFR, Estimated: >60 mL/min (ref >60)
Glucose, Bld: 153 mg/dL — ABNORMAL HIGH (ref 70–99)
Potassium: 4 mmol/L (ref 3.5–5.1)
Sodium: 139 mmol/L (ref 135–145)
Total Bilirubin: 1 mg/dL (ref 0.3–1.2)
Total Protein: 7 g/dL (ref 6.5–8.1)

## 2020-07-06 LAB — GLUCOSE, CAPILLARY
Glucose-Capillary: 153 mg/dL — ABNORMAL HIGH (ref 70–99)
Glucose-Capillary: 181 mg/dL — ABNORMAL HIGH (ref 70–99)
Glucose-Capillary: 182 mg/dL — ABNORMAL HIGH (ref 70–99)
Glucose-Capillary: 133 mg/dL — ABNORMAL HIGH (ref 70–99)

## 2020-07-06 LAB — URINE CULTURE: Culture: NO GROWTH

## 2020-07-06 LAB — CBC WITH DIFFERENTIAL/PLATELET
Abs Immature Granulocytes: 0.05 10*3/uL (ref 0.00–0.07)
Basophils Absolute: 0 10*3/uL (ref 0.0–0.1)
Basophils Relative: 0 %
Eosinophils Absolute: 0.1 10*3/uL (ref 0.0–0.5)
Eosinophils Relative: 1 %
HCT: 41.8 % (ref 39.0–52.0)
Hemoglobin: 13.8 g/dL (ref 13.0–17.0)
Immature Granulocytes: 0 %
Lymphocytes Relative: 13 %
Lymphs Abs: 1.5 10*3/uL (ref 0.7–4.0)
MCH: 30.5 pg (ref 26.0–34.0)
MCHC: 33 g/dL (ref 30.0–36.0)
MCV: 92.5 fL (ref 80.0–100.0)
Monocytes Absolute: 0.9 10*3/uL (ref 0.1–1.0)
Monocytes Relative: 8 %
Neutro Abs: 8.7 10*3/uL — ABNORMAL HIGH (ref 1.7–7.7)
Neutrophils Relative %: 78 %
Platelets: 210 10*3/uL (ref 150–400)
RBC: 4.52 MIL/uL (ref 4.22–5.81)
RDW: 12.5 % (ref 11.5–15.5)
WBC: 11.3 10*3/uL — ABNORMAL HIGH (ref 4.0–10.5)
nRBC: 0 % (ref 0.0–0.2)

## 2020-07-06 LAB — HIV ANTIBODY (ROUTINE TESTING W REFLEX): HIV Screen 4th Generation wRfx: NONREACTIVE

## 2020-07-06 MED ORDER — TAMSULOSIN HCL 0.4 MG PO CAPS
0.8000 mg | ORAL_CAPSULE | Freq: Every day | ORAL | Status: DC
  Administered 2020-07-06 – 2020-07-07 (×2): 0.8 mg via ORAL
  Filled 2020-07-06 (×2): qty 2

## 2020-07-06 MED ORDER — BISACODYL 10 MG RE SUPP
10.0000 mg | Freq: Every day | RECTAL | Status: DC | PRN
  Administered 2020-07-06: 10 mg via RECTAL
  Filled 2020-07-06 (×2): qty 1

## 2020-07-06 MED ORDER — MIRABEGRON ER 25 MG PO TB24
50.0000 mg | ORAL_TABLET | Freq: Every day | ORAL | Status: DC
  Administered 2020-07-06 – 2020-07-07 (×2): 50 mg via ORAL
  Filled 2020-07-06 (×2): qty 2

## 2020-07-06 MED ORDER — ACETAMINOPHEN 10 MG/ML IV SOLN
1000.0000 mg | Freq: Four times a day (QID) | INTRAVENOUS | Status: AC
  Administered 2020-07-06 – 2020-07-07 (×4): 1000 mg via INTRAVENOUS
  Filled 2020-07-06 (×4): qty 100

## 2020-07-06 NOTE — Progress Notes (Signed)
Urology Inpatient Progress Report  Pyelonephritis [N12] Left flank pain [R10.9] Left ureteral calculus [N20.1]     Intv/Subj: No acute events overnight. Patient complaining of abdominal pain and constipation Afebrile Blood cultures NGTD  Active Problems:   Pyelonephritis   Left ureteral calculus  Current Facility-Administered Medications  Medication Dose Route Frequency Provider Last Rate Last Admin  . acetaminophen (TYLENOL) tablet 650 mg  650 mg Oral Q4H PRN Remi Haggard, MD      . bisacodyl (DULCOLAX) suppository 10 mg  10 mg Rectal Daily PRN Ardis Hughs, MD      . cefTRIAXone (ROCEPHIN) 2 g in sodium chloride 0.9 % 100 mL IVPB  2 g Intravenous Q24H Kyle, Tyrone A, DO      . dextrose 5 % and 0.45 % NaCl with KCl 20 mEq/L infusion   Intravenous Continuous Remi Haggard, MD 75 mL/hr at 07/06/20 0609 New Bag at 07/06/20 7035  . docusate sodium (COLACE) capsule 100 mg  100 mg Oral BID Remi Haggard, MD   100 mg at 07/05/20 2052  . gabapentin (NEURONTIN) capsule 100 mg  100 mg Oral QHS Kyle, Tyrone A, DO   100 mg at 07/05/20 2052  . HYDROmorphone (DILAUDID) injection 0.5 mg  0.5 mg Intravenous Q2H PRN Ardis Hughs, MD   0.5 mg at 07/06/20 0093  . insulin aspart (novoLOG) injection 0-6 Units  0-6 Units Subcutaneous TID WC Kyle, Tyrone A, DO      . lactulose (CHRONULAC) 10 GM/15ML solution 20 g  20 g Oral BID Marylyn Ishihara, Tyrone A, DO   20 g at 07/05/20 1825  . LORazepam (ATIVAN) tablet 1 mg  1 mg Oral BID PRN Marylyn Ishihara, Tyrone A, DO      . metoCLOPramide (REGLAN) tablet 10 mg  10 mg Oral Q6H PRN Marylyn Ishihara, Tyrone A, DO      . ondansetron (ZOFRAN) injection 4 mg  4 mg Intravenous Q4H PRN Remi Haggard, MD      . opium-belladonna (B&O) suppository 16.2-60mg   1 suppository Rectal Q8H PRN Ardis Hughs, MD      . oxybutynin (DITROPAN) tablet 5 mg  5 mg Oral Q8H PRN Remi Haggard, MD      . oxyCODONE (Oxy IR/ROXICODONE) immediate release tablet 5-10 mg  5-10 mg Oral  Q4H PRN Ardis Hughs, MD      . polyethylene glycol (MIRALAX / GLYCOLAX) packet 17 g  17 g Oral Daily PRN Remi Haggard, MD      . rosuvastatin (CRESTOR) tablet 20 mg  20 mg Oral QHS Kyle, Tyrone A, DO   20 mg at 07/05/20 2053  . tamsulosin (FLOMAX) capsule 0.4 mg  0.4 mg Oral Daily Harold Barban B, MD   0.4 mg at 07/05/20 1830     Objective: Vital: Vitals:   07/05/20 1805 07/05/20 2056 07/06/20 0105 07/06/20 0611  BP: 126/83 135/77 (!) 148/90 (!) 141/87  Pulse: 79 79 80 81  Resp: 20 18 18 18   Temp: 98.4 F (36.9 C) 98.2 F (36.8 C) 98.6 F (37 C) 98 F (36.7 C)  TempSrc: Oral Oral Oral Oral  SpO2: 94% 92% 93% 94%  Weight:      Height:       I/Os: I/O last 3 completed shifts: In: 1741.2 [P.O.:480; I.V.:1161.2; IV Piggyback:100] Out: 8182 [Urine:1850]  Physical Exam:  General: Patient is in no apparent distress Lungs: Normal respiratory effort, chest expands symmetrically. GI: The abdomen is tender in lower section,  left CVA tenderness Ext: lower extremities symmetric  Lab Results: Recent Labs    07/05/20 0508 07/06/20 0504  WBC 10.9* 11.3*  HGB 15.2 13.8  HCT 44.8 41.8   Recent Labs    07/05/20 0508 07/06/20 0504  NA 137 139  K 4.5 4.0  CL 104 103  CO2 21* 25  GLUCOSE 166* 153*  BUN 21* 19  CREATININE 0.70 0.86  CALCIUM 9.1 8.7*   No results for input(s): LABPT, INR in the last 72 hours. No results for input(s): LABURIN in the last 72 hours. Results for orders placed or performed during the hospital encounter of 07/05/20  Blood culture (routine x 2)     Status: None (Preliminary result)   Collection Time: 07/05/20  9:20 AM   Specimen: BLOOD  Result Value Ref Range Status   Specimen Description   Final    BLOOD BLOOD LEFT FOREARM Performed at [facility] [address], [place], [place] [address]    Special Requests   Final    BOTTLES DRAWN AEROBIC AND ANAEROBIC Blood Culture adequate volume Performed at [facility] [address] [place], [place] [address]    Culture   Final    NO GROWTH < 24 HOURS Performed at [facility] [address] [place], [place] [address]    Report Status PENDING  Incomplete  Blood culture (routine x 2)     Status: None (Preliminary result)   Collection Time: 07/05/20  9:20 AM   Specimen: BLOOD  Result Value Ref Range Status   Specimen Description   Final    BLOOD RIGHT ANTECUBITAL Performed at [facility] [address] [place], [place] [address]    Special Requests   Final    BOTTLES DRAWN AEROBIC AND ANAEROBIC Blood Culture adequate volume Performed at [facility] [address], [place], [place] [address]    Culture   Final    NO GROWTH < 24 HOURS Performed at [facility] [address] [place], [place] [address]    Report Status PENDING  Incomplete    Studies/Results: CT ABDOMEN PELVIS W CONTRAST  Result Date: 07/05/2020 CLINICAL DATA:  Fever with renal stenting yesterday. Abscess/infection suspected EXAM: CT ABDOMEN AND PELVIS WITH CONTRAST TECHNIQUE: Multidetector CT imaging of the abdomen and pelvis was performed using the standard protocol following bolus administration of intravenous contrast. CONTRAST:  140mL OMNIPAQUE IOHEXOL 300 MG/ML  SOLN COMPARISON:  CT from 3 days ago FINDINGS: Lower chest: Multiple coronary calcifications. Mild atelectasis at the right base. Hepatobiliary: Hepatic steatosis. Negative for focal lesion.No evidence of biliary obstruction or stone. Pancreas: Unremarkable. Spleen: Unremarkable. Adrenals/Urinary Tract: Negative adrenals. Interval left ureteral stenting with improved hydronephrosis. No residual hydroureter. A left ureteral stent is seen with expected course. The stent is next to a stone at the upper ureter measuring 6 mm. Patchy gas is seen around the left ureter to the level of the left renal hilum. Small volume gas within the collecting  system, seen at the upper pole and anti dependent bladder. A small bladder calculus is again noted. There is prominent retroperitoneal edema in the perinephric space especially below the kidney. The fluid is low-density. No visible extravasation of urine on the delayed phase. Multiple bilateral renal calculi measuring up to 1 cm on the right. Stomach/Bowel: Stranding at the left colonic mesentery is considered secondary. No bowel wall thickening. No evidence of bowel obstruction. There is generalized moderate stool  retention. Vascular/Lymphatic: No acute vascular abnormality. No mass or adenopathy. Reproductive:No pathologic findings. Other: No ascites or pneumoperitoneum.  Fatty midline hernia. Musculoskeletal: No acute abnormalities. IMPRESSION: 1. Improved left hydronephrosis after ureteral stenting. The offending ureteral stone is present at the upper ureter. 2. Extensive gas tracking along the left ureter which could be postprocedural or from emphysematous infection. 3. Prominent degree of retroperitoneal fluid in the lower left perirenal space and tracking into the pelvis. Urine extravasation was not seen on the delayed phase through the kidneys. A delayed CT through the ureters could be obtained within the next few hours to determine the presence of ureteral extravasation. 4. No drainable collection. 5. Hepatic steatosis and extensive coronary atherosclerosis. Electronically Signed   By: Monte Fantasia M.D.   On: 07/05/2020 11:16   DG C-Arm 1-60 Min-No Report  Result Date: 07/04/2020 Fluoroscopy was utilized by the requesting physician.  No radiographic interpretation.    Assessment: 36M with pyelonephritis.  No evidence of sepsis blood cultures negative.  Constipated  Plan: Carb modified diet Cont Rocephin Aggressive bowel regimen B&O supp/ Oxycodone for pain Transition to PO abx this PM, possible discharge this PM   Louis Meckel, MD Urology 07/06/2020, 7:44 AM

## 2020-07-06 NOTE — Progress Notes (Signed)
Patient seen this PM.  No fevers, but persistent abdominal pain, mostly with urination.  Had large BM this morning, which relieved his pain for several hours.  Extremely anxious this PM.  Poor appetite.  Starting IV tylenol for better pain control Double flomax Start myrbetriq Ativan PRN  Transition to Cipro tomorrow.   D/C once medically stable.

## 2020-07-06 NOTE — Progress Notes (Signed)
Patient's CBG currently 159. Orders to notify MD if greater than 140. No night coverage ordered, notified medical staff on call per paging system. No new orders. Dawson Bills

## 2020-07-06 NOTE — Progress Notes (Signed)
PROGRESS NOTE    [name]  JSE:[id number] DOB: 02-28-1961 DOA: 07/05/2020 PCP: [name], MD    Brief Narrative:  59 year old gentleman with history of well-controlled diabetes on oral hypoglycemics underwent lithotripsy as outpatient and ureteric stent placed and discharged home comes back with abdominal pain, painful urination and bloody urine.  Also had temperature 101.7.  In the emergency room, hemodynamically stable.  Constipated.Due to significant symptoms was admitted.  CT scan consistent with postoperative changes.   Assessment & Plan:   Active Problems:   Pyelonephritis   Left ureteral calculus  Pyelonephritis: Post procedure pain tenderness.  Reasonable to treat with course of antibiotics.  Blood cultures and urine cultures negative so far.  Laxatives.  Antiemetics.  Mobility.  Anticipating discharge home with oral antibiotics and Flomax once symptoms improved.  Type 2 diabetes: On oral hypoglycemics at home.  Remains on sliding scale insulin.  Well-controlled.  Resume oral hypoglycemic on discharge.  Constipation: Significant.  No much results with laxatives.  Ordered soapsuds enema now.  Thank you for involving [place] in the care of [name].  We will continue to follow with you while he is in the hospital.   DVT prophylaxis: SCDs Start: 07/05/20 1936 SCDs Start: 07/05/20 1741   Code Status: Full code Family Communication: Wife at the bedside Disposition Plan: Status is: Observation  The patient remains OBS appropriate and will d/c before 2 midnights.  Dispo: The patient is from: Home              Anticipated d/c is to: Home              Anticipated d/c date is: 2 days              Patient currently is not medically stable to d/c.         Consultants:   TRH.  We are consulting.  Admitted by urology.  Procedures:   Lithotripsy and ureteric stent placement 12/1  Antimicrobials:   Rocephin, 12/2---   Subjective: Patient seen and  examined.  Abdomen is distended.  Still peeing blood.  No bowel movement yet.  Denies any nausea vomiting.  Wife was at the bedside.  Afebrile overnight.  Objective: Vitals:   07/05/20 1805 07/05/20 2056 07/06/20 0105 07/06/20 0611  BP: 126/83 135/77 (!) 148/90 (!) 141/87  Pulse: 79 79 80 81  Resp: 20 18 18 18   Temp: 98.4 F (36.9 C) 98.2 F (36.8 C) 98.6 F (37 C) 98 F (36.7 C)  TempSrc: Oral Oral Oral Oral  SpO2: 94% 92% 93% 94%  Weight:      Height:        Intake/Output Summary (Last 24 hours) at 07/06/2020 1133 Last data filed at 07/06/2020 0911 Gross per 24 hour  Intake 1881.22 ml  Output 1850 ml  Net 31.22 ml   Filed Weights   07/05/20 0457  Weight: 115.2 kg    Examination:  General exam: Appears anxious.  In mild distress with pain and distention. Respiratory system: Clear to auscultation. Respiratory effort normal. Cardiovascular system: S1 & S2 heard, RRR.  Gastrointestinal system: Abdomen is soft.  Distended.  No localized tenderness however mild diffuse tenderness all over.  Bowel sounds present. Central nervous system: Alert and oriented. No focal neurological deficits. Extremities: Symmetric 5 x 5 power. Skin: No rashes, lesions or ulcers Psychiatry: Judgement and insight appear normal. Mood & affect anxious.    Data Reviewed: I have personally reviewed following labs and imaging studies  CBC: Recent Labs  Lab 07/02/20 1934 07/05/20 0508 07/06/20 0504  WBC 15.9* 10.9* 11.3*  NEUTROABS  --  9.6* 8.7*  HGB 16.1 15.2 13.8  HCT 46.2 44.8 41.8  MCV 88.2 90.9 92.5  PLT 255 240 628   Basic Metabolic Panel: Recent Labs  Lab 07/02/20 1934 07/05/20 0508 07/06/20 0504  NA 139 137 139  K 4.1 4.5 4.0  CL 106 104 103  CO2 19* 21* 25  GLUCOSE 154* 166* 153*  BUN 24* 21* 19  CREATININE 1.09 0.70 0.86  CALCIUM 9.4 9.1 8.7*   GFR: Estimated Creatinine Clearance: 123.9 mL/min (by C-G formula based on SCr of 0.86 mg/dL). Liver Function  Tests: Recent Labs  Lab 07/05/20 0508 07/06/20 0504  AST 16 12*  ALT 12 9  ALKPHOS 49 45  BILITOT 1.2 1.0  PROT 7.4 7.0  ALBUMIN 4.3 4.0   Recent Labs  Lab 07/05/20 0920  LIPASE 28   No results for input(s): AMMONIA in the last 168 hours. Coagulation Profile: No results for input(s): INR, PROTIME in the last 168 hours. Cardiac Enzymes: No results for input(s): CKTOTAL, CKMB, CKMBINDEX, TROPONINI in the last 168 hours. BNP (last 3 results) No results for input(s): PROBNP in the last 8760 hours. HbA1C: No results for input(s): HGBA1C in the last 72 hours. CBG: Recent Labs  Lab 07/04/20 1351 07/05/20 2055 07/06/20 0734  GLUCAP 111* 159* 153*   Lipid Profile: No results for input(s): CHOL, HDL, LDLCALC, TRIG, CHOLHDL, LDLDIRECT in the last 72 hours. Thyroid Function Tests: No results for input(s): TSH, T4TOTAL, FREET4, T3FREE, THYROIDAB in the last 72 hours. Anemia Panel: No results for input(s): VITAMINB12, FOLATE, FERRITIN, TIBC, IRON, RETICCTPCT in the last 72 hours. Sepsis Labs: Recent Labs  Lab 07/05/20 0508  LATICACIDVEN 1.1    Recent Results (from the past 240 hour(s))  SARS CORONAVIRUS 2 (TAT 6-24 HRS) Nasopharyngeal Nasopharyngeal Swab     Status: None   Collection Time: 07/03/20 12:14 PM   Specimen: Nasopharyngeal Swab  Result Value Ref Range Status   SARS Coronavirus 2 NEGATIVE NEGATIVE Final    Comment: (NOTE) SARS-CoV-2 target nucleic acids are NOT DETECTED.  The SARS-CoV-2 RNA is generally detectable in upper and lower respiratory specimens during the acute phase of infection. Negative results do not preclude SARS-CoV-2 infection, do not rule out co-infections with other pathogens, and should not be used as the sole basis for treatment or other patient management decisions. Negative results must be combined with clinical observations, patient history, and epidemiological information. The expected result is Negative.  Fact Sheet for  Patients: SugarRoll.be  Fact Sheet for Healthcare Providers: https://www.woods-mathews.com/  This test is not yet approved or cleared by the [place] FDA and  has been authorized for detection and/or diagnosis of SARS-CoV-2 by FDA under an Emergency Use Authorization (EUA). This EUA will remain  in effect (meaning this test can be used) for the duration of the COVID-19 declaration under Se ction 564(b)(1) of the Act, 21 U.S.C. section 360bbb-3(b)(1), unless the authorization is terminated or revoked sooner.  Performed at [facility], [place] [address] [place], [place] [address]   Urine culture     Status: None   Collection Time: 07/05/20  5:04 AM   Specimen: Urine, Clean Catch  Result Value Ref Range Status   Specimen Description   Final    URINE, CLEAN CATCH Performed at [facility], [place] [address] [place], [place] [address]    Special Requests  Final    NONE Performed at [facility], [place] [address], [place], [place] [address]    Culture   Final    NO GROWTH Performed at [facility], [place] [address], [place], [place] [address]    Report Status 07/06/2020 FINAL  Final  Blood culture (routine x 2)     Status: None (Preliminary result)   Collection Time: 07/05/20  9:20 AM   Specimen: BLOOD  Result Value Ref Range Status   Specimen Description   Final    BLOOD BLOOD LEFT FOREARM Performed at [facility] [address], [place], [place] [address]    Special Requests   Final    BOTTLES DRAWN AEROBIC AND ANAEROBIC Blood Culture adequate volume Performed at [facility] [address] [place], [place] [address]    Culture   Final    NO GROWTH < 24 HOURS Performed at [facility] [address], [place], [place] [address]    Report Status PENDING  Incomplete  Blood culture (routine x 2)     Status: None (Preliminary result)    Collection Time: 07/05/20  9:20 AM   Specimen: BLOOD  Result Value Ref Range Status   Specimen Description   Final    BLOOD RIGHT ANTECUBITAL Performed at [facility] [address] [place], [place] [address]    Special Requests   Final    BOTTLES DRAWN AEROBIC AND ANAEROBIC Blood Culture adequate volume Performed at [facility] [address] [place], [place] [address]    Culture   Final    NO GROWTH < 24 HOURS Performed at [facility] [address], [place], [place] [address]    Report Status PENDING  Incomplete         Radiology Studies: CT ABDOMEN PELVIS W CONTRAST  Result Date: 07/05/2020 CLINICAL DATA:  Fever with renal stenting yesterday. Abscess/infection suspected EXAM: CT ABDOMEN AND PELVIS WITH CONTRAST TECHNIQUE: Multidetector CT imaging of the abdomen and pelvis was performed using the standard protocol following bolus administration of intravenous contrast. CONTRAST:  134mL OMNIPAQUE IOHEXOL 300 MG/ML  SOLN COMPARISON:  CT from 3 days ago FINDINGS: Lower chest: Multiple coronary calcifications. Mild atelectasis at the right base. Hepatobiliary: Hepatic steatosis. Negative for focal lesion.No evidence of biliary obstruction or stone. Pancreas: Unremarkable. Spleen: Unremarkable. Adrenals/Urinary Tract: Negative adrenals. Interval left ureteral stenting with improved hydronephrosis. No residual hydroureter. A left ureteral stent is seen with expected course. The stent is next to a stone at the upper ureter measuring 6 mm. Patchy gas is seen around the left ureter to the level of the left renal hilum. Small volume gas within the collecting system, seen at the upper pole and anti dependent bladder. A small bladder calculus is again noted. There is prominent retroperitoneal edema in the perinephric space especially below the kidney. The fluid is low-density. No visible extravasation of urine on the delayed phase. Multiple  bilateral renal calculi measuring up to 1 cm on the right. Stomach/Bowel: Stranding at the left colonic mesentery is considered secondary. No bowel wall thickening. No evidence of bowel obstruction. There is generalized moderate stool retention. Vascular/Lymphatic: No acute vascular abnormality. No mass or adenopathy. Reproductive:No pathologic findings. Other: No ascites or pneumoperitoneum.  Fatty midline hernia. Musculoskeletal: No acute abnormalities. IMPRESSION: 1. Improved left hydronephrosis after ureteral stenting. The offending ureteral stone is present at the upper ureter. 2. Extensive gas tracking along the left  ureter which could be postprocedural or from emphysematous infection. 3. Prominent degree of retroperitoneal fluid in the lower left perirenal space and tracking into the pelvis. Urine extravasation was not seen on the delayed phase through the kidneys. A delayed CT through the ureters could be obtained within the next few hours to determine the presence of ureteral extravasation. 4. No drainable collection. 5. Hepatic steatosis and extensive coronary atherosclerosis. Electronically Signed   By: Monte Fantasia M.D.   On: 07/05/2020 11:16   DG C-Arm 1-60 Min-No Report  Result Date: 07/04/2020 Fluoroscopy was utilized by the requesting physician.  No radiographic interpretation.        Scheduled Meds: . docusate sodium  100 mg Oral BID  . gabapentin  100 mg Oral QHS  . insulin aspart  0-6 Units Subcutaneous TID WC  . lactulose  20 g Oral BID  . rosuvastatin  20 mg Oral QHS  . tamsulosin  0.4 mg Oral Daily   Continuous Infusions: . cefTRIAXone (ROCEPHIN)  IV 2 g (07/06/20 1029)  . dextrose 5 % and 0.45 % NaCl with KCl 20 mEq/L 75 mL/hr at 07/06/20 0609     LOS: 0 days    Time spent: 25 minutes    Barb Merino, MD [facility] Pager [phone]

## 2020-07-07 DIAGNOSIS — N12 Tubulo-interstitial nephritis, not specified as acute or chronic: Secondary | ICD-10-CM | POA: Diagnosis not present

## 2020-07-07 DIAGNOSIS — N201 Calculus of ureter: Secondary | ICD-10-CM | POA: Diagnosis not present

## 2020-07-07 LAB — BASIC METABOLIC PANEL
Anion gap: 11 (ref 5–15)
BUN: 11 mg/dL (ref 6–20)
CO2: 26 mmol/L (ref 22–32)
Calcium: 8.5 mg/dL — ABNORMAL LOW (ref 8.9–10.3)
Chloride: 100 mmol/L (ref 98–111)
Creatinine, Ser: 0.61 mg/dL (ref 0.61–1.24)
GFR, Estimated: >60 mL/min (ref >60)
Glucose, Bld: 141 mg/dL — ABNORMAL HIGH (ref 70–99)
Potassium: 3.7 mmol/L (ref 3.5–5.1)
Sodium: 137 mmol/L (ref 135–145)

## 2020-07-07 LAB — CBC
HCT: 39.8 % (ref 39.0–52.0)
Hemoglobin: 12.9 g/dL — ABNORMAL LOW (ref 13.0–17.0)
MCH: 30.4 pg (ref 26.0–34.0)
MCHC: 32.4 g/dL (ref 30.0–36.0)
MCV: 93.6 fL (ref 80.0–100.0)
Platelets: 188 10*3/uL (ref 150–400)
RBC: 4.25 MIL/uL (ref 4.22–5.81)
RDW: 12.5 % (ref 11.5–15.5)
WBC: 8.6 10*3/uL (ref 4.0–10.5)
nRBC: 0 % (ref 0.0–0.2)

## 2020-07-07 LAB — GLUCOSE, CAPILLARY
Glucose-Capillary: 127 mg/dL — ABNORMAL HIGH (ref 70–99)
Glucose-Capillary: 132 mg/dL — ABNORMAL HIGH (ref 70–99)

## 2020-07-07 MED ORDER — OXYCODONE HCL 5 MG PO TABS
5.0000 mg | ORAL_TABLET | Freq: Four times a day (QID) | ORAL | 0 refills | Status: DC | PRN
Start: 2020-07-07 — End: 2020-07-18

## 2020-07-07 MED ORDER — MAGNESIUM CITRATE PO SOLN
1.0000 | Freq: Once | ORAL | Status: DC
  Filled 2020-07-07: qty 296

## 2020-07-07 NOTE — Discharge Summary (Signed)
Date of admission: 07/05/2020  Date of discharge: 07/07/2020  Admission diagnosis: Left mid ureteral stone, left renal stones  Discharge diagnosis: Left mid ureteral stone, left renal stones  Secondary diagnoses: None  History and Physical: For full details, please see admission history and physical. Briefly, [name] is a 59 y.o. year old patient with left nephrolithiasis. He underwent left USE on 57/3 which was complicated by bleeding with plan for second look USE 1 week later.   He was readmitted on 12/2 for possible pyelonephritis due to new flank pain and fever. Urine cultures negative. CT with expected post op changes. Following resuscitation and fluids, as well as bowel regimen for constipation, patient was discharged home on 12/4 with plans to follow up for second look procedure in approximatley 1 week. Given negative culture, no antibiotics were prescribed.   Laboratory values:  Recent Labs    07/05/20 0508 07/06/20 0504 07/07/20 0625  HGB 15.2 13.8 12.9*  HCT 44.8 41.8 39.8   Recent Labs    07/06/20 0504 07/07/20 0625  CREATININE 0.86 0.61    Disposition: Home  Discharge instruction: None  Discharge medications:  Allergies as of 07/07/2020      Reactions   Merthiolate [thimerosol]    Unknown reaction   Morphine    REACTION: paronia   Nickel Dermatitis      Medication List    TAKE these medications   acetaminophen 500 MG tablet Commonly known as: TYLENOL Take 1,000 mg by mouth every 6 (six) hours as needed for fever.   amoxicillin 500 MG capsule Commonly known as: AMOXIL Take 4 capsules (2,000 mg) 1 hour before dental cleaning.   aspirin EC 81 MG tablet Take 81 mg by mouth daily. Swallow whole.   bisacodyl 10 MG suppository Commonly known as: DULCOLAX Place 10 mg rectally as needed for moderate constipation.   celecoxib 200 MG capsule Commonly known as: CELEBREX Take 200 mg by mouth 2 (two) times daily.   CENTRUM PO Take 1 tablet by mouth  daily.   cetirizine 10 MG tablet Commonly known as: ZYRTEC Take 10 mg by mouth as needed for allergies.   cyclobenzaprine 10 MG tablet Commonly known as: FLEXERIL TAKE ONE TABLET BY MOUTH EVERY 8 HOURS AS NEEDED FOR MUSCLE SPASM What changed:   how much to take  how to take this  when to take this  reasons to take this  additional instructions   diphenhydrAMINE 25 MG tablet Commonly known as: BENADRYL Take 75 mg by mouth every 6 (six) hours as needed for allergies.   eszopiclone 2 MG Tabs tablet Commonly known as: Lunesta Take 1 tablet (2 mg total) by mouth at bedtime as needed for sleep. Take immediately before bedtime   gabapentin 100 MG capsule Commonly known as: NEURONTIN Take 1 capsule (100 mg total) by mouth 3 (three) times daily. What changed: when to take this   glucose blood test strip Commonly known as: Contour Next Test Use twice daily for glucose control.  Dx E11.9   Jardiance 10 MG Tabs tablet Generic drug: empagliflozin Take 10 mg by mouth daily.   Lancet Device Misc Contour next test lancets.  Test once daily.  Dx E11.9   LORazepam 1 MG tablet Commonly known as: ATIVAN TAKE ONE TABLET BY MOUTH TWICE A DAY AS NEEDED FOR ANXIETY What changed:   how much to take  how to take this  when to take this  reasons to take this  additional instructions   Melatonin  10 MG Tabs Take 15 mg by mouth at bedtime as needed (sleep).   metFORMIN 500 MG 24 hr tablet Commonly known as: GLUCOPHAGE-XR TAKE 2 TABLETS BY MOUTH twice daily. What changed:   how much to take  how to take this  when to take this  additional instructions   metoCLOPramide 10 MG tablet Commonly known as: REGLAN Take 1 tablet (10 mg total) by mouth every 6 (six) hours. What changed:   when to take this  reasons to take this   OVER THE COUNTER MEDICATION Melatonin 15 mg at bedtime   oxyCODONE 5 MG immediate release tablet Commonly known as: Roxicodone Take 1 tablet  (5 mg total) by mouth every 6 (six) hours as needed for severe pain.   phenazopyridine 200 MG tablet Commonly known as: Pyridium Take 1 tablet (200 mg total) by mouth 3 (three) times daily as needed for pain.   potassium citrate 10 MEQ (1080 MG) SR tablet Commonly known as: UROCIT-K TAKE 1 TABLET BY MOUTH TWO TIMES A DAY... What changed:   how much to take  how to take this  when to take this  additional instructions   rosuvastatin 20 MG tablet Commonly known as: CRESTOR Take 1 tablet (20 mg total) by mouth daily. What changed: when to take this   tamsulosin 0.4 MG Caps capsule Commonly known as: FLOMAX Take 1 capsule (0.4 mg total) by mouth daily.       Followup: plan for follow up for procedure in approx 1 week.

## 2020-07-07 NOTE — Progress Notes (Signed)
Nurse reviewed discharge instructions with pt.  Pt verbalized understanding of discharge instructions, follow up appointments and new medications.  Questions and concerns answered and addressed prior to discharge.

## 2020-07-07 NOTE — Progress Notes (Signed)
PROGRESS NOTE    [name]  KGY:[id number] DOB: 1960/12/12 DOA: 07/05/2020 PCP: [name], MD    Brief Narrative:  59 year old gentleman with history of well-controlled diabetes on oral hypoglycemics underwent lithotripsy as outpatient and ureteric stent placed and discharged home comes back with abdominal pain, painful urination and bloody urine.  Also had temperature 101.7.  In the emergency room, hemodynamically stable.  Constipated. Due to significant symptoms was admitted.  CT scan consistent with postoperative changes.   Assessment & Plan:   Active Problems:   Pyelonephritis   Left ureteral calculus  Pyelonephritis: Post procedure pain and tenderness. On antibiotics. Blood cultures and urine cultures negative so far. Laxatives.  Antiemetics.  Mobility.  Anticipating discharge home with oral antibiotics and Flomax once symptoms improved.  Type 2 diabetes: On oral hypoglycemics at home.  Remains on sliding scale insulin.  Well-controlled.  Resume oral hypoglycemic on discharge.  Constipation: Significant.  No much results with laxatives. Another dose of enema today.  Thank you for involving [place] in the care of Mr. [name].  We will continue to follow with you while he is in the hospital.  Patient clinically improving. Discharge as per surgical plan. Can resume all home medications on discharge.   DVT prophylaxis: SCDs Start: 07/05/20 1936 SCDs Start: 07/05/20 1741   Code Status: Full code Family Communication: None today. Disposition Plan: Status is: Observation  The patient remains OBS appropriate and will d/c before 2 midnights.  Dispo: The patient is from: Home              Anticipated d/c is to: Home              Anticipated d/c date is: When stable surgically.              Patient currently medically stable. Discharge when surgically appropriate.         Consultants:   [facility].  We are consulting.  Admitted by urology.  Procedures:   Lithotripsy and  ureteric stent placement 12/1  Antimicrobials:   Rocephin, 12/2---   Subjective: Patient seen and examined. Still has hematuria. Had good bowel movement after soapsuds enema yesterday, however no more since then. Using another enema today. He is eager to go home after having good bowel movement.  Objective: Vitals:   07/06/20 1341 07/06/20 1613 07/06/20 2133 07/07/20 0609  BP: 133/81 116/80 116/75 121/72  Pulse: 90 87 75 68  Resp: 17 18 15 16   Temp: 99.8 F (37.7 C) 99 F (37.2 C) (!) 97.3 F (36.3 C) 98.5 F (36.9 C)  TempSrc: Oral Oral Oral Oral  SpO2: 93% 95% 93% 96%  Weight:      Height:        Intake/Output Summary (Last 24 hours) at 07/07/2020 1132 Last data filed at 07/07/2020 0900 Gross per 24 hour  Intake 2004.12 ml  Output 2075 ml  Net -70.88 ml   Filed Weights   07/05/20 0457  Weight: 115.2 kg    Examination:  General exam: Appears anxious.  In mild distress with pain and distention. Respiratory system: Clear to auscultation. Respiratory effort normal. Cardiovascular system: S1 & S2 heard, RRR.  Gastrointestinal system: Abdomen is soft.  Distended.  No localized tenderness however mild diffuse tenderness all over.  Bowel sounds present. Central nervous system: Alert and oriented. No focal neurological deficits. Extremities: Symmetric 5 x 5 power. Skin: No rashes, lesions or ulcers Psychiatry: Judgement and insight appear normal. Mood & affect normal.  Data Reviewed: I have personally reviewed following labs and imaging studies  CBC: Recent Labs  Lab 07/02/20 1934 07/05/20 0508 07/06/20 0504 07/07/20 0625  WBC 15.9* 10.9* 11.3* 8.6  NEUTROABS  --  9.6* 8.7*  --   HGB 16.1 15.2 13.8 12.9*  HCT 46.2 44.8 41.8 39.8  MCV 88.2 90.9 92.5 93.6  PLT 255 240 210 081   Basic Metabolic Panel: Recent Labs  Lab 07/02/20 1934 07/05/20 0508 07/06/20 0504 07/07/20 0625  NA 139 137 139 137  K 4.1 4.5 4.0 3.7  CL 106 104 103 100  CO2 19* 21* 25  26  GLUCOSE 154* 166* 153* 141*  BUN 24* 21* 19 11  CREATININE 1.09 0.70 0.86 0.61  CALCIUM 9.4 9.1 8.7* 8.5*   GFR: Estimated Creatinine Clearance: 133.2 mL/min (by C-G formula based on SCr of 0.61 mg/dL). Liver Function Tests: Recent Labs  Lab 07/05/20 0508 07/06/20 0504  AST 16 12*  ALT 12 9  ALKPHOS 49 45  BILITOT 1.2 1.0  PROT 7.4 7.0  ALBUMIN 4.3 4.0   Recent Labs  Lab 07/05/20 0920  LIPASE 28   No results for input(s): AMMONIA in the last 168 hours. Coagulation Profile: No results for input(s): INR, PROTIME in the last 168 hours. Cardiac Enzymes: No results for input(s): CKTOTAL, CKMB, CKMBINDEX, TROPONINI in the last 168 hours. BNP (last 3 results) No results for input(s): PROBNP in the last 8760 hours. HbA1C: No results for input(s): HGBA1C in the last 72 hours. CBG: Recent Labs  Lab 07/06/20 0734 07/06/20 1205 07/06/20 1756 07/06/20 2143 07/07/20 0729  GLUCAP 153* 181* 182* 133* 127*   Lipid Profile: No results for input(s): CHOL, HDL, LDLCALC, TRIG, CHOLHDL, LDLDIRECT in the last 72 hours. Thyroid Function Tests: No results for input(s): TSH, T4TOTAL, FREET4, T3FREE, THYROIDAB in the last 72 hours. Anemia Panel: No results for input(s): VITAMINB12, FOLATE, FERRITIN, TIBC, IRON, RETICCTPCT in the last 72 hours. Sepsis Labs: Recent Labs  Lab 07/05/20 0508  LATICACIDVEN 1.1    Recent Results (from the past 240 hour(s))  SARS CORONAVIRUS 2 (TAT 6-24 HRS) Nasopharyngeal Nasopharyngeal Swab     Status: None   Collection Time: 07/03/20 12:14 PM   Specimen: Nasopharyngeal Swab  Result Value Ref Range Status   SARS Coronavirus 2 NEGATIVE NEGATIVE Final    Comment: (NOTE) SARS-CoV-2 target nucleic acids are NOT DETECTED.  The SARS-CoV-2 RNA is generally detectable in upper and lower respiratory specimens during the acute phase of infection. Negative results do not preclude SARS-CoV-2 infection, do not rule out co-infections with other pathogens,  and should not be used as the sole basis for treatment or other patient management decisions. Negative results must be combined with clinical observations, patient history, and epidemiological information. The expected result is Negative.  Fact Sheet for Patients: SugarRoll.be  Fact Sheet for Healthcare Providers: https://www.woods-mathews.com/  This test is not yet approved or cleared by the [place] FDA and  has been authorized for detection and/or diagnosis of SARS-CoV-2 by FDA under an Emergency Use Authorization (EUA). This EUA will remain  in effect (meaning this test can be used) for the duration of the COVID-19 declaration under Se ction 564(b)(1) of the Act, 21 U.S.C. section 360bbb-3(b)(1), unless the authorization is terminated or revoked sooner.  Performed at [facility], [place] [address], [place], [place] [address]   Urine culture     Status: None   Collection Time: 07/05/20  5:04 AM   Specimen: Urine, Clean  Catch  Result Value Ref Range Status   Specimen Description   Final    URINE, CLEAN CATCH Performed at [facility], [place] [address] [place], [place] [address]    Special Requests   Final    NONE Performed at [facility], [place] [address] [place], [place] [address]    Culture   Final    NO GROWTH Performed at [facility], [place] [address] [place], [place] [address]    Report Status 07/06/2020 FINAL  Final  Blood culture (routine x 2)     Status: None (Preliminary result)   Collection Time: 07/05/20  9:20 AM   Specimen: BLOOD  Result Value Ref Range Status   Specimen Description   Final    BLOOD BLOOD LEFT FOREARM Performed at [place] [address], [place], [place] [address]    Special Requests   Final    BOTTLES DRAWN AEROBIC AND ANAEROBIC Blood Culture adequate volume Performed at [place] [address] [place], [place] [address]    Culture   Final    NO GROWTH 2 DAYS Performed at [place] [address], [place], [place] [address]    Report Status PENDING  Incomplete  Blood culture (routine x 2)     Status: None (Preliminary result)   Collection Time: 07/05/20  9:20 AM   Specimen: BLOOD  Result Value Ref Range Status   Specimen Description   Final    BLOOD RIGHT ANTECUBITAL Performed at [place] [address], [place], [place] [address]    Special Requests   Final    BOTTLES DRAWN AEROBIC AND ANAEROBIC Blood Culture adequate volume Performed at [place] [address] [place], [place] [address]    Culture   Final    NO GROWTH 2 DAYS Performed at [place] [address], [place], [place] [address]    Report Status PENDING  Incomplete         Radiology Studies: No results found.      Scheduled Meds: . docusate sodium  100 mg Oral BID  . gabapentin  100 mg Oral QHS  . insulin aspart  0-6 Units Subcutaneous TID WC  . lactulose  20 g Oral BID  . mirabegron ER  50 mg Oral Daily  . rosuvastatin  20 mg Oral QHS  . tamsulosin  0.8 mg Oral Daily   Continuous Infusions: . acetaminophen 1,000 mg (07/07/20 0626)  . cefTRIAXone (ROCEPHIN)  IV 2 g (07/07/20 0919)  . dextrose 5 % and 0.45 % NaCl with KCl 20 mEq/L 75 mL/hr at 07/06/20 1818     LOS: 0 days    Time spent: 25 minutes    Barb Merino, MD [facility] Pager [phone]

## 2020-07-10 LAB — CULTURE, BLOOD (ROUTINE X 2)
Culture: NO GROWTH
Culture: NO GROWTH
Special Requests: ADEQUATE
Special Requests: ADEQUATE

## 2020-07-13 ENCOUNTER — Other Ambulatory Visit: Payer: Self-pay | Admitting: Family Medicine

## 2020-07-16 ENCOUNTER — Other Ambulatory Visit: Payer: Self-pay

## 2020-07-16 ENCOUNTER — Other Ambulatory Visit (HOSPITAL_COMMUNITY)
Admission: RE | Admit: 2020-07-16 | Discharge: 2020-07-16 | Disposition: A | Discharge status: Home / Self Care | Payer: BC Managed Care – PPO | Source: Ambulatory Visit | Attending: Urology | Admitting: Urology

## 2020-07-16 ENCOUNTER — Encounter (HOSPITAL_COMMUNITY): Payer: Self-pay | Admitting: Urology

## 2020-07-16 DIAGNOSIS — Z7982 Long term (current) use of aspirin: Secondary | ICD-10-CM | POA: Diagnosis not present

## 2020-07-16 DIAGNOSIS — Z79899 Other long term (current) drug therapy: Secondary | ICD-10-CM | POA: Diagnosis not present

## 2020-07-16 DIAGNOSIS — Z7984 Long term (current) use of oral hypoglycemic drugs: Secondary | ICD-10-CM | POA: Diagnosis not present

## 2020-07-16 DIAGNOSIS — N202 Calculus of kidney with calculus of ureter: Secondary | ICD-10-CM | POA: Diagnosis not present

## 2020-07-16 DIAGNOSIS — Z20822 Contact with and (suspected) exposure to covid-19: Secondary | ICD-10-CM | POA: Insufficient documentation

## 2020-07-16 DIAGNOSIS — Z791 Long term (current) use of non-steroidal anti-inflammatories (NSAID): Secondary | ICD-10-CM | POA: Diagnosis not present

## 2020-07-16 DIAGNOSIS — N200 Calculus of kidney: Secondary | ICD-10-CM | POA: Diagnosis not present

## 2020-07-16 DIAGNOSIS — Z01812 Encounter for preprocedural laboratory examination: Secondary | ICD-10-CM | POA: Insufficient documentation

## 2020-07-16 DIAGNOSIS — Z87442 Personal history of urinary calculi: Secondary | ICD-10-CM | POA: Diagnosis not present

## 2020-07-16 NOTE — Progress Notes (Signed)
COVID Vaccine Completed: Yes  Date COVID Vaccine completed: 05/05/20, 10/28/19, 09/29/19 COVID vaccine manufacturer: [place]     PCP - [name], MD last office visit 06/27/20 in epic [place], [place] office visit 05/03/20 in epic  Chest x-ray - greater than 1 year in epic EKG - 05/03/20 in epic Stress Test - greater than 2 years in epic ECHO - greater than 2 years in epic Cardiac Cath - N/A Pacemaker/ICD device last checked:N/A  Sleep Study - N/A CPAP - N/A  Fasting Blood Sugar - N/A Checks Blood Sugar _N/A___ times a day  Blood Thinner Instructions:N/A Aspirin Instructions:N/A Last Dose:N/A  Activity level:   Able to exercise without symptoms                           Anesthesia review: N/A  Patient denies shortness of breath, fever, cough and chest pain at PAT appointment   Patient verbalized understanding of instructions that were given to them at the PAT appointment. Patient was also instructed that they will need to review over the PAT instructions again at home before surgery.

## 2020-07-17 LAB — SARS CORONAVIRUS 2 (TAT 6-24 HRS): SARS Coronavirus 2: NEGATIVE

## 2020-07-18 ENCOUNTER — Ambulatory Visit (HOSPITAL_COMMUNITY)
Admission: RE | Admit: 2020-07-18 | Discharge: 2020-07-18 | Disposition: A | Discharge status: Home / Self Care | Payer: BC Managed Care – PPO | Attending: Urology | Admitting: Urology

## 2020-07-18 ENCOUNTER — Ambulatory Visit (HOSPITAL_COMMUNITY): Payer: BC Managed Care – PPO | Admitting: Certified Registered Nurse Anesthetist

## 2020-07-18 ENCOUNTER — Encounter (HOSPITAL_COMMUNITY)
Admission: RE | Disposition: A | Discharge status: Home / Self Care | Payer: Self-pay | Source: Home / Self Care | Attending: Urology

## 2020-07-18 ENCOUNTER — Encounter (HOSPITAL_COMMUNITY): Payer: Self-pay | Admitting: Urology

## 2020-07-18 ENCOUNTER — Ambulatory Visit (HOSPITAL_COMMUNITY): Payer: BC Managed Care – PPO

## 2020-07-18 DIAGNOSIS — N201 Calculus of ureter: Secondary | ICD-10-CM

## 2020-07-18 DIAGNOSIS — Z7984 Long term (current) use of oral hypoglycemic drugs: Secondary | ICD-10-CM | POA: Insufficient documentation

## 2020-07-18 DIAGNOSIS — Z79899 Other long term (current) drug therapy: Secondary | ICD-10-CM | POA: Insufficient documentation

## 2020-07-18 DIAGNOSIS — Z7982 Long term (current) use of aspirin: Secondary | ICD-10-CM | POA: Diagnosis not present

## 2020-07-18 DIAGNOSIS — Z87442 Personal history of urinary calculi: Secondary | ICD-10-CM | POA: Insufficient documentation

## 2020-07-18 DIAGNOSIS — E785 Hyperlipidemia, unspecified: Secondary | ICD-10-CM | POA: Diagnosis not present

## 2020-07-18 DIAGNOSIS — Z791 Long term (current) use of non-steroidal anti-inflammatories (NSAID): Secondary | ICD-10-CM | POA: Diagnosis not present

## 2020-07-18 DIAGNOSIS — Z20822 Contact with and (suspected) exposure to covid-19: Secondary | ICD-10-CM | POA: Insufficient documentation

## 2020-07-18 DIAGNOSIS — N202 Calculus of kidney with calculus of ureter: Secondary | ICD-10-CM | POA: Diagnosis not present

## 2020-07-18 DIAGNOSIS — G4700 Insomnia, unspecified: Secondary | ICD-10-CM | POA: Diagnosis not present

## 2020-07-18 DIAGNOSIS — E119 Type 2 diabetes mellitus without complications: Secondary | ICD-10-CM | POA: Diagnosis not present

## 2020-07-18 DIAGNOSIS — N200 Calculus of kidney: Secondary | ICD-10-CM | POA: Diagnosis not present

## 2020-07-18 HISTORY — PX: CYSTOSCOPY/URETEROSCOPY/HOLMIUM LASER/STENT PLACEMENT: SHX6546

## 2020-07-18 LAB — BASIC METABOLIC PANEL
Anion gap: 10 (ref 5–15)
BUN: 18 mg/dL (ref 6–20)
CO2: 22 mmol/L (ref 22–32)
Calcium: 8.6 mg/dL — ABNORMAL LOW (ref 8.9–10.3)
Chloride: 108 mmol/L (ref 98–111)
Creatinine, Ser: 0.67 mg/dL (ref 0.61–1.24)
GFR, Estimated: >60 mL/min (ref >60)
Glucose, Bld: 137 mg/dL — ABNORMAL HIGH (ref 70–99)
Potassium: 3.9 mmol/L (ref 3.5–5.1)
Sodium: 140 mmol/L (ref 135–145)

## 2020-07-18 LAB — CBC
HCT: 42.5 % (ref 39.0–52.0)
Hemoglobin: 14.1 g/dL (ref 13.0–17.0)
MCH: 30.2 pg (ref 26.0–34.0)
MCHC: 33.2 g/dL (ref 30.0–36.0)
MCV: 91 fL (ref 80.0–100.0)
Platelets: 258 10*3/uL (ref 150–400)
RBC: 4.67 MIL/uL (ref 4.22–5.81)
RDW: 12.4 % (ref 11.5–15.5)
WBC: 6 10*3/uL (ref 4.0–10.5)
nRBC: 0 % (ref 0.0–0.2)

## 2020-07-18 LAB — GLUCOSE, CAPILLARY
Glucose-Capillary: 127 mg/dL — ABNORMAL HIGH (ref 70–99)
Glucose-Capillary: 149 mg/dL — ABNORMAL HIGH (ref 70–99)

## 2020-07-18 SURGERY — CYSTOSCOPY/URETEROSCOPY/HOLMIUM LASER/STENT PLACEMENT
Anesthesia: General | Laterality: Bilateral

## 2020-07-18 MED ORDER — ONDANSETRON HCL 4 MG/2ML IJ SOLN
INTRAMUSCULAR | Status: AC
  Filled 2020-07-18: qty 2

## 2020-07-18 MED ORDER — KETOROLAC TROMETHAMINE 30 MG/ML IJ SOLN
30.0000 mg | Freq: Once | INTRAMUSCULAR | Status: AC | PRN
  Administered 2020-07-18: 30 mg via INTRAVENOUS

## 2020-07-18 MED ORDER — MIDAZOLAM HCL 5 MG/5ML IJ SOLN
INTRAMUSCULAR | Status: DC | PRN
  Administered 2020-07-18: 2 mg via INTRAVENOUS

## 2020-07-18 MED ORDER — OXYCODONE HCL 5 MG PO TABS
ORAL_TABLET | ORAL | Status: AC
  Administered 2020-07-18: 5 mg
  Filled 2020-07-18: qty 1

## 2020-07-18 MED ORDER — LIDOCAINE 2% (20 MG/ML) 5 ML SYRINGE
INTRAMUSCULAR | Status: DC | PRN
  Administered 2020-07-18: 100 mg via INTRAVENOUS

## 2020-07-18 MED ORDER — LACTATED RINGERS IV SOLN: INTRAVENOUS | Status: DC

## 2020-07-18 MED ORDER — FENTANYL CITRATE (PF) 100 MCG/2ML IJ SOLN
INTRAMUSCULAR | Status: DC | PRN
  Administered 2020-07-18: 25 ug via INTRAVENOUS
  Administered 2020-07-18: 50 ug via INTRAVENOUS
  Administered 2020-07-18 (×3): 25 ug via INTRAVENOUS

## 2020-07-18 MED ORDER — OXYCODONE HCL 5 MG PO TABS: 5.0000 mg | ORAL_TABLET | Freq: Once | ORAL | Status: DC

## 2020-07-18 MED ORDER — DEXAMETHASONE SODIUM PHOSPHATE 10 MG/ML IJ SOLN
INTRAMUSCULAR | Status: AC
  Filled 2020-07-18: qty 1

## 2020-07-18 MED ORDER — CIPROFLOXACIN HCL 500 MG PO TABS: 500.0000 mg | ORAL_TABLET | Freq: Once | ORAL | 0 refills | Status: AC

## 2020-07-18 MED ORDER — KETOROLAC TROMETHAMINE 30 MG/ML IJ SOLN
INTRAMUSCULAR | Status: AC
  Filled 2020-07-18: qty 1

## 2020-07-18 MED ORDER — FENTANYL CITRATE (PF) 100 MCG/2ML IJ SOLN
INTRAMUSCULAR | Status: AC
  Filled 2020-07-18: qty 2

## 2020-07-18 MED ORDER — BELLADONNA ALKALOIDS-OPIUM 16.2-60 MG RE SUPP
RECTAL | Status: DC | PRN
  Administered 2020-07-18: 1 via RECTAL

## 2020-07-18 MED ORDER — FENTANYL CITRATE (PF) 100 MCG/2ML IJ SOLN: 25.0000 ug | INTRAMUSCULAR | Status: DC | PRN

## 2020-07-18 MED ORDER — ACETAMINOPHEN 10 MG/ML IV SOLN
INTRAVENOUS | Status: AC
  Filled 2020-07-18: qty 100

## 2020-07-18 MED ORDER — OXYCODONE HCL 5 MG PO TABS: 5.0000 mg | ORAL_TABLET | Freq: Four times a day (QID) | ORAL | 0 refills | Status: DC | PRN

## 2020-07-18 MED ORDER — BELLADONNA ALKALOIDS-OPIUM 16.2-30 MG RE SUPP
RECTAL | Status: AC
  Filled 2020-07-18: qty 1

## 2020-07-18 MED ORDER — MIDAZOLAM HCL 2 MG/2ML IJ SOLN
INTRAMUSCULAR | Status: AC
  Filled 2020-07-18: qty 2

## 2020-07-18 MED ORDER — PROPOFOL 10 MG/ML IV BOLUS
INTRAVENOUS | Status: DC | PRN
  Administered 2020-07-18: 200 mg via INTRAVENOUS

## 2020-07-18 MED ORDER — ACETAMINOPHEN 10 MG/ML IV SOLN
INTRAVENOUS | Status: DC | PRN
  Administered 2020-07-18: 1000 mg via INTRAVENOUS

## 2020-07-18 MED ORDER — CEFAZOLIN SODIUM-DEXTROSE 2-4 GM/100ML-% IV SOLN
2.0000 g | INTRAVENOUS | Status: AC
  Administered 2020-07-18: 2 g via INTRAVENOUS
  Filled 2020-07-18: qty 100

## 2020-07-18 MED ORDER — ONDANSETRON HCL 4 MG/2ML IJ SOLN
INTRAMUSCULAR | Status: DC | PRN
  Administered 2020-07-18: 4 mg via INTRAVENOUS

## 2020-07-18 MED ORDER — TAMSULOSIN HCL 0.4 MG PO CAPS: 0.4000 mg | ORAL_CAPSULE | Freq: Two times a day (BID) | ORAL | 0 refills | Status: DC

## 2020-07-18 MED ORDER — ORAL CARE MOUTH RINSE: 15.0000 mL | Freq: Once | OROMUCOSAL | Status: AC

## 2020-07-18 MED ORDER — CHLORHEXIDINE GLUCONATE 0.12 % MT SOLN
15.0000 mL | Freq: Once | OROMUCOSAL | Status: AC
  Administered 2020-07-18: 15 mL via OROMUCOSAL

## 2020-07-18 MED ORDER — PROPOFOL 10 MG/ML IV BOLUS
INTRAVENOUS | Status: AC
  Filled 2020-07-18: qty 40

## 2020-07-18 SURGICAL SUPPLY — 22 items
BAG URO CATCHER STRL LF (MISCELLANEOUS) ×2 IMPLANT
BASKET ZERO TIP NITINOL 2.4FR (BASKET) IMPLANT
CATH URET 5FR 28IN OPEN ENDED (CATHETERS) ×2 IMPLANT
CATH URET DUAL LUMEN 6-10FR 50 (CATHETERS) ×2 IMPLANT
CLOTH BEACON ORANGE TIMEOUT ST (SAFETY) ×2 IMPLANT
DRSG TEGADERM 2-3/8X2-3/4 SM (GAUZE/BANDAGES/DRESSINGS) ×2 IMPLANT
EXTRACTOR STONE 1.7FRX115CM (UROLOGICAL SUPPLIES) ×2 IMPLANT
FIBER LASER MOSES 200 DFL (Laser) ×2 IMPLANT
GLOVE BIOGEL M STRL SZ7.5 (GLOVE) ×2 IMPLANT
GOWN STRL REUS W/TWL XL LVL3 (GOWN DISPOSABLE) ×2 IMPLANT
GUIDEWIRE ANG ZIPWIRE 038X150 (WIRE) IMPLANT
GUIDEWIRE STR DUAL SENSOR (WIRE) ×4 IMPLANT
KIT BALLIN UROMAX 15FX10 (LABEL) ×1 IMPLANT
KIT TURNOVER KIT A (KITS) IMPLANT
MANIFOLD NEPTUNE II (INSTRUMENTS) ×2 IMPLANT
PACK CYSTO (CUSTOM PROCEDURE TRAY) ×2 IMPLANT
SET HIGH PRES BAL DIL (LABEL) ×1
SHEATH URETERAL 12FRX28CM (UROLOGICAL SUPPLIES) IMPLANT
SHEATH URETERAL 12FRX35CM (MISCELLANEOUS) ×2 IMPLANT
STENT URET 6FRX28 CONTOUR (STENTS) ×4 IMPLANT
TUBING CONNECTING 10 (TUBING) ×2 IMPLANT
TUBING UROLOGY SET (TUBING) ×2 IMPLANT

## 2020-07-18 NOTE — Anesthesia Postprocedure Evaluation (Signed)
Anesthesia Post Note  Patient: [name]  Procedure(s) Performed: BILATERAL URETEROSCOPY BILATERAL RETROGRADE PYELOGRAM HOLMIUM LASER/STENT PLACEMENT (Bilateral )     Patient location during evaluation: PACU Anesthesia Type: General Level of consciousness: awake and alert Pain management: pain level controlled Vital Signs Assessment: post-procedure vital signs reviewed and stable Respiratory status: spontaneous breathing, nonlabored ventilation, respiratory function stable and patient connected to nasal cannula oxygen Cardiovascular status: blood pressure returned to baseline and stable Postop Assessment: no apparent nausea or vomiting Anesthetic complications: no   No complications documented.  Last Vitals:  Vitals:   07/18/20 0722 07/18/20 1130  BP: 130/85 (!) 152/107  Pulse: 70 100  Resp: 18 16  Temp: 36.6 C 36.8 C  SpO2: 100% 97%    Last Pain:  Vitals:   07/18/20 1130  TempSrc:   PainSc: Asleep                 Yee Joss S

## 2020-07-18 NOTE — Interval H&P Note (Signed)
History and Physical Interval Note:  07/18/2020 8:32 AM  [name]  has presented today for surgery, with the diagnosis of BILATERAL RENAL STONES.  The various methods of treatment have been discussed with the patient and family. After consideration of risks, benefits and other options for treatment, the patient has consented to  Procedure(s): BILATERAL URETEROSCOPY BILATERAL RETROGRADE PYELOGRAM HOLMIUM LASER/STENT PLACEMENT (Bilateral) as a surgical intervention.  [name] history has been reviewed, patient examined, no change in status, stable for surgery.  I have reviewed the patient's chart and labs.  Questions were answered to the patient's satisfaction.     Ardis Hughs

## 2020-07-18 NOTE — Op Note (Signed)
Preoperative diagnosis:  1. Bilateral ureteral stones   Postoperative diagnosis:  1. same   Procedure: 1. Bilateral ureteroscopy, laser lithotripsy and stone removal 2. Bilateral retrograde pyelogram with interpretation 3. Left ureteral stent exchange, right ureteral stent placement 4. Right ureteral balloon dilation  Surgeon: Ardis Hughs, MD  Anesthesia: General  Complications: None  Intraoperative findings:  #1: [name] left retrograde pyelogram demonstrated a small filling defect in the proximal ureter and no other significant hydroureteronephrosis.  Ureteroscopy demonstrated that this patient's stone had dropped into the proximal ureter and was consistent with the patient's filling defect. #2: [name] right retrograde pyelogram was normal demonstrating a normal narrow caliber ureter with no filling defects. #3: All the stones were completely removed in the patient's left side and a 26 cm x 6 French double-J ureteral stent was exchanged. #4: [name] right ureter was very narrow and required balloon dilation in 4 separate locations to completely get all the way up into the kidney. The stone was located in the upper pole and was completely removed. Pulling back also along the ureter and removing the access sheath simultaneously noted that there was several areas of ureteral trauma from the balloon dilation, but the ureter was intact. 26 cm time 6 French double-J ureteral stent was placed in the patient's right ureter.  EBL: Minimal  Specimens: Stones from both kidneys which were given to the patient upon discharge.  Indication: [name] is a 59 y.o. patient with history of left renal colic who is seen 2 weeks ago in clinic for an obstructing left-sided stone. The patient was taken the operating room at that time and a stent was placed and some of the stone was fragmented. However there was some bleeding because of the fact the patient had remained on his  aspirin and meloxicam. As such, at that time the procedure was staged.  After reviewing the management options for treatment, he elected to proceed with the above surgical procedure(s). We have discussed the potential benefits and risks of the procedure, side effects of the proposed treatment, the likelihood of the patient achieving the goals of the procedure, and any potential problems that might occur during the procedure or recuperation. Informed consent has been obtained.  Description of procedure:  The patient was taken to the operating room and general anesthesia was induced.  The patient was placed in the dorsal lithotomy position, prepped and draped in the usual sterile fashion, and preoperative antibiotics were administered. A preoperative time-out was performed.   21 French 0 degree cystoscope was gently passed to the patient's urethra into the bladder under visual guidance. Stent was grasped from the patient's left ureteral orifice and brought to the urethral meatus. A sensor wire was advanced up through the stent and the stent was removed over the wire. Stent was then exchanged for a 5 [place] open-ended ureteral catheter and retrograde pyelogram was performed with the above findings. I then advanced a semirigid ureteroscope to the patient's urethra and into the bladder cannulating the patient's left ureteral orifice quite easily. I then advanced it up to the pelvic inlet noting no ureteral trauma or significant abnormalities. I then advanced a second wire through the ureteroscope prior to removing it. I then advanced a 12/14 [place] x35 cm ureteral access sheath over the second wire and into the mid ureter. The inner portion of the sheath and the wire removed simultaneously. I then advanced the flexible ureteroscope through the sheath and into the proximal ureter. I  did that time run into the stone that had dropped into the ureter. Using a 200 m laser fiber the stone was then fragmented into  smaller pieces. The stone was noted to be quite hard and as such I did not think dusting it would be a viable option. I fragmented into approximately 10 pieces and removed them in their entirety. I then went into the kidney and remove several other stone fragments, residuals from the previous surgery. I injected contrast into the patient's left collecting system and then performed fluoroscopic guided pyeloscopy noting no additional stone fragments. I then slowly backed out the ureteroscope and that she simultaneously noting no significant ureteral trauma. I then backloaded the wire through the cystoscope and advanced the scope back into the patient's bladder. Subsequently placed a 26 cm x 6 French double-J ureteral stent over the wire into the renal pelvis under fluoroscopic guidance. Once it was noted to be well within the renal pelvis I slowly backed out the wire and advanced the stent simultaneously. A nice curl was noted in the bladder as well as in the renal pelvis on the left at the end. I then subsequently pulled the tether through the patient's urethra and readvanced the cystoscope. I then advanced a wire through the scope and into the right ureter. I then advanced a dual-lumen catheter over the wire performed retrograde pyelogram with the above findings. I then advanced a second wire up into the right renal pelvis and attempted to pass the dual-lumen catheter unsuccessfully. I was unable to get it past the UVJ. As such remove the dual-lumen catheter and opted to balloon dilate the patient's ureter. Using a 15 French x10 cm UroMax balloon I balloon dilated the distal ureter, mid ureter, proximal ureter, and UPJ. I then advanced the ureteral access sheath up into the mid ureter before removing the inner portion as long a well the wire. Subsequently advanced the flexible ureteroscope into the ureter and up into the right renal pelvis. The stone was encountered in the upper pole and was fragmented into 10 or  more pieces. These were then removed with the engage basket. Once this stones were noted to be removed and there were no other residual stones or able to be basketed I slowly backed out the ureteral access sheath noting in 2 places that there was some trauma from the dilation with fat glistening through the wall of the ureter. However, the true lumen remained and there were no other abnormalities. I then backloaded the safety wire through the cystoscope and passed it back into the patient's bladder. I subsequently advanced a 26 cm time 6 French double-J ureteral stent on the right side. Stent tether was then brought out to the patient's urethra. The 2 stents were then tied together and secured on the dorsum of the patient's penis.  Disposition: The patient will be instructed to remove the stent in 1 week. Advised him to take a ciprofloxacin tablet prior to removing. We also discussed the use of Pyridium and intermittent pain medication.  Ardis Hughs, M.D.

## 2020-07-18 NOTE — Anesthesia Procedure Notes (Signed)
Procedure Name: LMA Insertion Date/Time: 07/18/2020 8:49 AM Performed by: West Pugh, CRNA Pre-anesthesia Checklist: Patient identified, Emergency Drugs available, Suction available, Patient being monitored and Timeout performed Patient Re-evaluated:Patient Re-evaluated prior to induction Oxygen Delivery Method: Circle system utilized Preoxygenation: Pre-oxygenation with 100% oxygen Induction Type: IV induction LMA: LMA with gastric port inserted LMA Size: 5.0 Number of attempts: 1 Placement Confirmation: positive ETCO2 and breath sounds checked- equal and bilateral Tube secured with: Tape Dental Injury: Teeth and Oropharynx as per pre-operative assessment

## 2020-07-18 NOTE — Discharge Instructions (Signed)
DISCHARGE INSTRUCTIONS FOR KIDNEY STONE/URETERAL STENT   MEDICATIONS:  1. Resume all your other meds from home - except do not take any extra narcotic pain meds that you may have at home.  2. Pyridium is to help with the burning/stinging when you urinate. 3. Oxycodone is for moderate/severe pain, otherwise taking upto 1000 mg every 6 hours of plainTylenol will help treat your pain.   4. Take Cipro one hour prior to removal of your stent.  5. Take Flomax 0.4mg  twice daily for stent discomfort.  ACTIVITY:  1. No strenuous activity x 1week  2. No driving while on narcotic pain medications  3. Drink plenty of water  4. Continue to walk at home - you can still get blood clots when you are at home, so keep active, but don't over do it.  5. May return to work/school tomorrow or when you feel ready   BATHING:  1. You can shower and we recommend daily showers  2. You have a string coming from your urethra: The stent string is attached to your ureteral stent. Do not pull on this.   SIGNS/SYMPTOMS TO CALL:  Please call us if you have a fever greater than 101.5, uncontrolled nausea/vomiting, uncontrolled pain, dizziness, unable to urinate, bloody urine, chest pain, shortness of breath, leg swelling, leg pain, redness around wound, drainage from wound, or any other concerns or questions.   You can reach [place] at [phone].   FOLLOW-UP:  1. You have an appointment in 6 weeks with a ultrasound of your kidneys prior.  2. You have a string attached to your stents, you may remove it on Wednesday, December 22th. To do this, pull the strings until the stents are completely removed. You may feel an odd sensation in your back.

## 2020-07-18 NOTE — OR Nursing (Signed)
Stone taken by Dr. Herrick 

## 2020-07-18 NOTE — Anesthesia Preprocedure Evaluation (Signed)
Anesthesia Evaluation  Patient identified by MRN, date of birth, ID band Patient awake    Reviewed: Allergy & Precautions, H&P , NPO status , Patient's Chart, lab work & pertinent test results  Airway Mallampati: II  TM Distance: <3 FB Neck ROM: Full    Dental no notable dental hx.    Pulmonary neg pulmonary ROS,    Pulmonary exam normal breath sounds clear to auscultation       Cardiovascular negative cardio ROS Normal cardiovascular exam Rhythm:Regular Rate:Normal     Neuro/Psych negative neurological ROS  negative psych ROS   GI/Hepatic negative GI ROS, Neg liver ROS,   Endo/Other  diabetes  Renal/GU   negative genitourinary   Musculoskeletal negative musculoskeletal ROS (+)   Abdominal   Peds negative pediatric ROS (+)  Hematology negative hematology ROS (+)   Anesthesia Other Findings   Reproductive/Obstetrics negative OB ROS                             Anesthesia Physical Anesthesia Plan  ASA: II  Anesthesia Plan: General   Post-op Pain Management:    Induction: Intravenous  PONV Risk Score and Plan: 2 and Ondansetron, Dexamethasone and Treatment may vary due to age or medical condition  Airway Management Planned: LMA  Additional Equipment:   Intra-op Plan:   Post-operative Plan: Extubation in OR  Informed Consent: I have reviewed the patients History and Physical, chart, labs and discussed the procedure including the risks, benefits and alternatives for the proposed anesthesia with the patient or authorized representative who has indicated his/her understanding and acceptance.     Dental advisory given  Plan Discussed with: CRNA and Surgeon  Anesthesia Plan Comments:         Anesthesia Quick Evaluation

## 2020-07-18 NOTE — Transfer of Care (Signed)
Immediate Anesthesia Transfer of Care Note  Patient: [name]  Procedure(s) Performed: BILATERAL URETEROSCOPY BILATERAL RETROGRADE PYELOGRAM HOLMIUM LASER/STENT PLACEMENT (Bilateral )  Patient Location: PACU  Anesthesia Type:General  Level of Consciousness: awake, drowsy and patient cooperative  Airway & Oxygen Therapy: Patient Spontanous Breathing and Patient connected to face mask oxygen  Post-op Assessment: Report given to RN and Post -op Vital signs reviewed and stable  Post vital signs: Reviewed and stable  Last Vitals:  Vitals Value Taken Time  BP 152/107 07/18/20 1130  Temp    Pulse 91 07/18/20 1131  Resp 10 07/18/20 1131  SpO2 98 % 07/18/20 1131  Vitals shown include unvalidated device data.  Last Pain:  Vitals:   07/18/20 0722  TempSrc: Oral  PainSc:       Patients Stated Pain Goal: 3 (77/93/96 8864)  Complications: No complications documented.

## 2020-07-19 ENCOUNTER — Encounter (HOSPITAL_COMMUNITY): Payer: Self-pay | Admitting: Urology

## 2020-07-20 ENCOUNTER — Telehealth: Payer: Self-pay | Admitting: Family Medicine

## 2020-07-20 ENCOUNTER — Encounter: Payer: Self-pay | Admitting: Family Medicine

## 2020-07-20 MED ORDER — BLOOD GLUCOSE MONITOR KIT: PACK | 0 refills | Status: AC

## 2020-07-20 NOTE — Telephone Encounter (Signed)
Printed Rx faxed to [facility] at [phone].

## 2020-07-20 NOTE — Telephone Encounter (Signed)
[name] is calling and requesting another Glucose reader meter  sent to [name] at [place], [place]  Phone:  [phone] Fax:  [phone] CB is [phone]

## 2020-07-23 ENCOUNTER — Other Ambulatory Visit: Payer: Self-pay | Admitting: Family Medicine

## 2020-07-24 ENCOUNTER — Other Ambulatory Visit: Payer: Self-pay

## 2020-07-24 ENCOUNTER — Other Ambulatory Visit (INDEPENDENT_AMBULATORY_CARE_PROVIDER_SITE_OTHER): Payer: BC Managed Care – PPO

## 2020-07-24 DIAGNOSIS — E785 Hyperlipidemia, unspecified: Secondary | ICD-10-CM

## 2020-07-24 LAB — LIPID PANEL
Cholesterol: 144 mg/dL (ref <200)
HDL: 43 mg/dL (ref >=40)
LDL Cholesterol (Calc): 64 mg/dL (calc)
Non-HDL Cholesterol (Calc): 101 mg/dL (calc) (ref <130)
Total CHOL/HDL Ratio: 3.3 (calc) (ref <5.0)
Triglycerides: 304 mg/dL — ABNORMAL HIGH (ref <150)

## 2020-07-24 LAB — HEPATIC FUNCTION PANEL
AG Ratio: 1.7 (calc) (ref 1.0–2.5)
ALT: 3 U/L — ABNORMAL LOW (ref 9–46)
AST: 10 U/L (ref 10–35)
Albumin: 4.1 g/dL (ref 3.6–5.1)
Alkaline phosphatase (APISO): 60 U/L (ref 35–144)
Bilirubin, Direct: 0.1 mg/dL (ref 0.0–0.2)
Globulin: 2.4 g/dL (calc) (ref 1.9–3.7)
Indirect Bilirubin: 0.8 mg/dL (calc) (ref 0.2–1.2)
Total Bilirubin: 0.9 mg/dL (ref 0.2–1.2)
Total Protein: 6.5 g/dL (ref 6.1–8.1)

## 2020-07-24 MED ORDER — CONTOUR TEST VI STRP: ORAL_STRIP | 12 refills | Status: AC

## 2020-07-24 MED ORDER — CONTOUR TEST VI STRP: ORAL_STRIP | 12 refills | Status: DC

## 2020-07-24 NOTE — Addendum Note (Signed)
Addended by: [name] on: 07/24/2020 05:57 PM   Modules accepted: Orders

## 2020-07-24 NOTE — Telephone Encounter (Signed)
Sent in clarification to check blood sugars twice daily

## 2020-07-24 NOTE — Telephone Encounter (Signed)
glucose blood (CONTOUR TEST) test strip   Sig: Use as instructed   The pharmacy is needing a new Rx with the frequency of how often to use the test strips.  Kristopher Oppenheim at [place], [place] Phone:  [phone]  Fax:  [phone]

## 2020-07-30 ENCOUNTER — Encounter: Payer: Self-pay | Admitting: Family Medicine

## 2020-08-07 ENCOUNTER — Encounter: Payer: Self-pay | Admitting: Family Medicine

## 2020-08-07 ENCOUNTER — Other Ambulatory Visit: Payer: Self-pay

## 2020-08-07 MED ORDER — PANTOPRAZOLE SODIUM 40 MG PO TBEC: 40.0000 mg | DELAYED_RELEASE_TABLET | Freq: Every day | ORAL | 1 refills | Status: DC

## 2020-08-07 NOTE — Progress Notes (Signed)
p 

## 2020-08-14 ENCOUNTER — Telehealth: Payer: Self-pay | Admitting: Family Medicine

## 2020-08-14 NOTE — Telephone Encounter (Signed)
FYIElta Frye w/[employer] is calling in to let us know that the pt has enrolled in one of their programs and Marica Otter is his (Nurse Case Manager) [phone].

## 2020-08-17 ENCOUNTER — Other Ambulatory Visit: Payer: Self-pay | Admitting: Podiatry

## 2020-08-17 DIAGNOSIS — G5792 Unspecified mononeuropathy of left lower limb: Secondary | ICD-10-CM

## 2020-08-17 DIAGNOSIS — G5791 Unspecified mononeuropathy of right lower limb: Secondary | ICD-10-CM

## 2020-08-27 ENCOUNTER — Other Ambulatory Visit: Payer: Self-pay | Admitting: Family Medicine

## 2020-09-06 DIAGNOSIS — N200 Calculus of kidney: Secondary | ICD-10-CM | POA: Diagnosis not present

## 2020-09-07 ENCOUNTER — Other Ambulatory Visit: Payer: Self-pay | Admitting: Family Medicine

## 2020-09-07 DIAGNOSIS — N200 Calculus of kidney: Secondary | ICD-10-CM | POA: Diagnosis not present

## 2020-09-07 DIAGNOSIS — N281 Cyst of kidney, acquired: Secondary | ICD-10-CM | POA: Diagnosis not present

## 2020-09-12 DIAGNOSIS — N6324 Unspecified lump in the left breast, lower inner quadrant: Secondary | ICD-10-CM | POA: Diagnosis not present

## 2020-09-24 DIAGNOSIS — N200 Calculus of kidney: Secondary | ICD-10-CM | POA: Diagnosis not present

## 2020-09-27 ENCOUNTER — Other Ambulatory Visit: Payer: Self-pay | Admitting: General Surgery

## 2020-09-27 DIAGNOSIS — N63 Unspecified lump in unspecified breast: Secondary | ICD-10-CM

## 2020-09-28 ENCOUNTER — Encounter: Payer: Self-pay | Admitting: Family Medicine

## 2020-09-28 MED ORDER — ESZOPICLONE 3 MG PO TABS: 3.0000 mg | ORAL_TABLET | Freq: Every day | ORAL | 0 refills | Status: DC

## 2020-09-28 NOTE — Telephone Encounter (Signed)
We can increase this to 3 mg (top dose)- but just need to be careful to not take with pain medications.

## 2020-10-09 DIAGNOSIS — E119 Type 2 diabetes mellitus without complications: Secondary | ICD-10-CM | POA: Diagnosis not present

## 2020-10-09 DIAGNOSIS — H524 Presbyopia: Secondary | ICD-10-CM | POA: Diagnosis not present

## 2020-10-09 LAB — HM DIABETES EYE EXAM

## 2020-10-21 ENCOUNTER — Other Ambulatory Visit: Payer: Self-pay | Admitting: Family Medicine

## 2020-10-22 NOTE — Telephone Encounter (Signed)
Okay to refill amoxicillin

## 2020-10-31 ENCOUNTER — Ambulatory Visit
Admission: RE | Admit: 2020-10-31 | Discharge: 2020-10-31 | Disposition: A | Discharge status: Home / Self Care | Payer: BC Managed Care – PPO | Source: Ambulatory Visit | Attending: General Surgery | Admitting: General Surgery

## 2020-10-31 ENCOUNTER — Other Ambulatory Visit: Payer: Self-pay

## 2020-10-31 DIAGNOSIS — N63 Unspecified lump in unspecified breast: Secondary | ICD-10-CM

## 2020-10-31 DIAGNOSIS — R928 Other abnormal and inconclusive findings on diagnostic imaging of breast: Secondary | ICD-10-CM | POA: Diagnosis not present

## 2020-10-31 DIAGNOSIS — N6489 Other specified disorders of breast: Secondary | ICD-10-CM | POA: Diagnosis not present

## 2020-11-05 ENCOUNTER — Other Ambulatory Visit: Payer: Self-pay | Admitting: Family Medicine

## 2020-11-05 MED ORDER — LORAZEPAM 1 MG PO TABS: ORAL_TABLET | ORAL | 0 refills | Status: DC

## 2020-11-05 NOTE — Telephone Encounter (Signed)
Last filled 06/07/2020 Last OV 06/27/2020  Ok to fill?

## 2020-11-07 ENCOUNTER — Other Ambulatory Visit: Payer: Self-pay | Admitting: Family Medicine

## 2020-11-08 NOTE — Telephone Encounter (Signed)
Last filled by a historical provider.  Ok to fill?

## 2020-11-08 NOTE — Telephone Encounter (Signed)
Refill OK

## 2020-12-04 ENCOUNTER — Other Ambulatory Visit: Payer: Self-pay | Admitting: Family Medicine

## 2020-12-05 NOTE — Telephone Encounter (Signed)
Last refill 09/28/20 Last office visit 06/27/20

## 2020-12-06 ENCOUNTER — Other Ambulatory Visit: Payer: Self-pay | Admitting: Family Medicine

## 2021-01-15 ENCOUNTER — Other Ambulatory Visit: Payer: Self-pay | Admitting: Family Medicine

## 2021-01-18 ENCOUNTER — Ambulatory Visit (INDEPENDENT_AMBULATORY_CARE_PROVIDER_SITE_OTHER): Payer: BC Managed Care – PPO | Admitting: Family Medicine

## 2021-01-18 ENCOUNTER — Encounter: Payer: Self-pay | Admitting: Family Medicine

## 2021-01-18 ENCOUNTER — Other Ambulatory Visit: Payer: Self-pay

## 2021-01-18 VITALS — BP 126/74 | HR 100 | Temp 97.7°F | Wt 254.4 lb

## 2021-01-18 DIAGNOSIS — M159 Polyosteoarthritis, unspecified: Secondary | ICD-10-CM

## 2021-01-18 DIAGNOSIS — F5104 Psychophysiologic insomnia: Secondary | ICD-10-CM | POA: Diagnosis not present

## 2021-01-18 DIAGNOSIS — K219 Gastro-esophageal reflux disease without esophagitis: Secondary | ICD-10-CM | POA: Diagnosis not present

## 2021-01-18 DIAGNOSIS — M8949 Other hypertrophic osteoarthropathy, multiple sites: Secondary | ICD-10-CM

## 2021-01-18 DIAGNOSIS — E119 Type 2 diabetes mellitus without complications: Secondary | ICD-10-CM

## 2021-01-18 LAB — POCT GLYCOSYLATED HEMOGLOBIN (HGB A1C): Hemoglobin A1C: 6.1 % — AB (ref 4.0–5.6)

## 2021-01-18 NOTE — Patient Instructions (Signed)
Try to scale back the Celebrex to one daily.   Consider supplement with Pepcid 20 mg twice daily  Elevate head of bed 4-6 inches  Avoid eating within 3 hours of bedtime.   Gradually reduce the caffeine.    I am setting up GI referral.

## 2021-01-18 NOTE — Progress Notes (Signed)
Established Patient Office Visit  Subjective:  Patient ID: [name], male    DOB: 10/17/1960  Age: 60 y.o. MRN: [id number]  CC:  Chief Complaint  Patient presents with   Gastroesophageal Reflux    Acid reflux, x 3 months, unable to sleep due to this    HPI [name] presents for acid reflux for few months at least.  Symptoms tend to be worse at night.  He is currently on Protonix 40 mg daily but even with taking that consistently has had several nights where he wakes up with acid sensation in his throat.  Denies any appetite or weight changes.  No dysphagia.  No pain with swallowing.  He had leftover metoclopramide but that did not seem to help his symptoms.  No recent melena.  No hematemesis.  He had wondered whether it was time for him to see a gastroenterologist given his persistent symptoms on Protonix.  Type 2 diabetes.  History of good control.  Currently managed with Jardiance and metformin.  A1c has recently been very well controlled.  Osteoarthritis involving multiple joints.  He takes Celebrex 200 mg twice daily and apparently has been on this for several years.  He is not sure how much this helps.  Has not tried tapering back.  Chronic intermittent insomnia.  Requesting refills of Lunesta.  Past Medical History:  Diagnosis Date   Arthritis    BURSITIS, LEFT HIP 05/20/2010   Chronic kidney disease    stones; 13 stones, currently has 2 stones    Claustrophobia    "take Lorazepam if I have to fly"   DIAB W/O COMP TYPE II/UNS NOT STATED UNCNTRL 03/30/2009   ELEVATED BLOOD PRESSURE 03/30/2009   Fissure, anal    occ bleeds still   Headache(784.0)    "only when I was working; had headaches and migraines"   History of hiatal hernia    Hyperlipidemia 10/11/2010   Insomnia    Irregular heart beat    Numbness and tingling of both feet    Pancreatic carcinoma ([place]) 2010   SHOULDER PAIN, LEFT 10/20/2008   Skin cancer     Past Surgical History:  Procedure Laterality  Date   ANAL FISSURE REPAIR  07/28/2011   Procedure: ANAL FISSURE REPAIR;  Surgeon: Adin Hector, MD;  Location: [facility];  Service: General;  Laterality: N/A;  Repair Anal Fissure/sphinterotomy and excision rectal polypl   BACK SURGERY  2000   "had 2 cages put in"   BIOPSY PANCREAS  2010   benign   CYSTOSCOPY/URETEROSCOPY/HOLMIUM LASER/STENT PLACEMENT Left 07/04/2020   Procedure: CYSTOSCOPY LEFT RETROGRADE URETEROSCOPY/HOLMIUM LASER/STENT PLACEMENT;  Surgeon: Ardis Hughs, MD;  Location: [facility];  Service: Urology;  Laterality: Left;   CYSTOSCOPY/URETEROSCOPY/HOLMIUM LASER/STENT PLACEMENT Bilateral 07/18/2020   Procedure: BILATERAL URETEROSCOPY BILATERAL RETROGRADE PYELOGRAM HOLMIUM LASER/STENT PLACEMENT;  Surgeon: Ardis Hughs, MD;  Location: [facility];  Service: Urology;  Laterality: Bilateral;   HERNIA REPAIR  8341   umbilical   KIDNEY STONES  2010 & 2008 & 2005   LAPAROSCOPIC INCISIONAL / UMBILICAL / VENTRAL HERNIA REPAIR  07/08/11   VHR   left rotator cuff  02/26/2011   left shoulder bone spurs  2011   LIPOMA EXCISION     "several; off back, arms"   pancreatic tumor  2010   right rotator cuff     2011   TONSILLECTOMY  1967   TOTAL KNEE ARTHROPLASTY Left 04/28/2017   Procedure: LEFT TOTAL KNEE ARTHROPLASTY;  Surgeon:  Paralee Cancel, MD;  Location: [facility];  Service: Orthopedics;  Laterality: Left;   VASECTOMY  1994   VENTRAL HERNIA REPAIR  07/08/2011   Procedure: LAPAROSCOPIC VENTRAL HERNIA;  Surgeon: Adin Hector, MD;  Location: [facility];  Service: General;  Laterality: N/A;  Laparoscopic repair ventral hernia with mesh, Lysis of adhesions.    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  Tobacco Use   Smoking status: Never   Smokeless tobacco: Never  Vaping Use   Vaping Use: Never used  Substance and Sexual Activity   Alcohol use: No    Comment: 07/08/11 "maybe 2-3 times a year"   Drug use: No   Sexual activity: Yes  Other Topics Concern   Not on file  Social History Narrative   Not on file   Social Determinants of Health   Financial Resource Strain: Not on file  Food Insecurity: Not on file  Transportation Needs: Not on file  Physical Activity: Not on file  Stress: Not on file  Social Connections: Not on file  Intimate Partner Violence: Not on file    Outpatient Medications Prior to Visit  Medication Sig Dispense Refill   amoxicillin (AMOXIL) 500 MG capsule TAKE FOUR CAPSULES BY MOUTH ONE HOUR BEFORE DENTAL CLEANING 12 capsule 0   aspirin EC 81 MG tablet Take 81 mg by mouth daily. Swallow whole.     blood glucose meter kit and supplies KIT Use as directed 1 each 0   celecoxib (CELEBREX) 200 MG capsule TAKE ONE CAPSULE BY MOUTH TWICE A DAY 180 capsule 0   cyclobenzaprine (FLEXERIL) 10 MG tablet TAKE ONE TABLET BY MOUTH EVERY 8 HOURS AS NEEDED FOR MUSCLE SPASMS 60 tablet 0   diphenhydrAMINE (BENADRYL) 25 MG tablet Take 75 mg by mouth every 6 (six) hours as needed for allergies.      Eszopiclone 3 MG TABS TAKE ONE TABLET BY MOUTH EVERY NIGHT AT BEDTIME 30 tablet 0   gabapentin (NEURONTIN) 100 MG capsule Take 1 capsule (100 mg total) by mouth 3 (three) times daily. (Patient taking differently: Take 100 mg by mouth at bedtime.) 90 capsule 3   glucose blood (CONTOUR TEST) test strip Use test strips twice daily as needed to check blood sugars 100 each 12   JARDIANCE 10 MG TABS tablet TAKE ONE TABLET BY MOUTH DAILY 90 tablet 3   Lancet Device MISC Contour next test lancets.  Test once daily.  Dx E11.9 100 each 3    LORazepam (ATIVAN) 1 MG tablet TAKE ONE TABLET BY MOUTH TWICE A DAY AS NEEDED FOR ANXIETY 30 tablet 0   metFORMIN (GLUCOPHAGE-XR) 500 MG 24 hr tablet TAKE TWO TABLETS BY MOUTH TWICE A DAY 360 tablet 1   Multiple Vitamins-Minerals (CENTRUM PO) Take 1 tablet by mouth daily.     oxyCODONE (ROXICODONE) 5 MG immediate release tablet Take 1-2 tablets (5-10 mg total) by mouth every 6 (six) hours as  needed for severe pain. 15 tablet 0   pantoprazole (PROTONIX) 40 MG tablet Take 1 tablet (40 mg total) by mouth daily. 90 tablet 1   phenazopyridine (PYRIDIUM) 200 MG tablet Take 1 tablet (200 mg total) by mouth 3 (three) times daily as needed for pain. 10 tablet 0   potassium citrate (UROCIT-K) 10 MEQ (1080 MG) SR tablet TAKE ONE TABLET BY MOUTH TWICE A DAY 180 tablet 3   rosuvastatin (CRESTOR) 20 MG tablet Take 1 tablet (20 mg total) by mouth daily. (Patient taking differently: Take 20 mg by mouth at bedtime.) 90 tablet 3   tamsulosin (FLOMAX) 0.4 MG CAPS capsule Take 1 capsule (0.4 mg total) by mouth in the morning and at bedtime. 20 capsule 0   acetaminophen (TYLENOL) 500 MG tablet Take 1,000 mg by mouth every 6 (six) hours as needed for fever.     bisacodyl (DULCOLAX) 10 MG suppository Place 10 mg rectally as needed for moderate constipation.     cetirizine (ZYRTEC) 10 MG tablet Take 10 mg by mouth as needed for allergies.     metoCLOPramide (REGLAN) 10 MG tablet Take 1 tablet (10 mg total) by mouth every 6 (six) hours. (Patient taking differently: Take 10 mg by mouth every 6 (six) hours as needed for nausea.) 30 tablet 0   OVER THE COUNTER MEDICATION Melatonin 15 mg at bedtime     No facility-administered medications prior to visit.    Allergies  Allergen Reactions   Merthiolate [Thimerosol]     Unknown reaction   Morphine     REACTION: paronia   Nickel Dermatitis    ROS Review of Systems  Constitutional:  Negative for appetite change, fatigue and unexpected weight change.  HENT:  Negative for  trouble swallowing.   Eyes:  Negative for visual disturbance.  Respiratory:  Negative for cough, chest tightness and shortness of breath.   Cardiovascular:  Negative for chest pain, palpitations and leg swelling.  Neurological:  Negative for dizziness, syncope, weakness, light-headedness and headaches.     Objective:    Physical Exam Vitals reviewed.  Constitutional:      Appearance: Normal appearance.  Cardiovascular:     Rate and Rhythm: Normal rate and regular rhythm.  Pulmonary:     Effort: Pulmonary effort is normal.     Breath sounds: Normal breath sounds.  Abdominal:     Palpations: Abdomen is soft.     Tenderness: There is no abdominal tenderness. There is no guarding or rebound.  Neurological:     Mental Status: He is alert.    BP 126/74 (BP Location: Left Arm, Patient Position: Sitting, Cuff Size: Normal)   Pulse 100   Temp 97.7 F (36.5 C) (Oral)   Wt 254 lb 6.4 oz (115.4 kg)   SpO2 96%   BMI 33.11 kg/m  Wt Readings from Last 3 Encounters:  01/18/21 254 lb 6.4 oz (115.4 kg)  07/18/20 253 lb 9.6 oz (115 kg)  07/05/20 254 lb (115.2 kg)     Health Maintenance Due  Topic Date Due   FOOT EXAM  05/06/2014   URINE MICROALBUMIN  03/23/2018   COVID-19 Vaccine (4 - Booster for [place] series) 09/05/2020    There are no preventive care reminders to display for this patient.  Lab Results  Component Value Date   TSH 1.96 08/08/2019   Lab Results  Component Value Date   WBC 6.0 07/18/2020   HGB 14.1 07/18/2020   HCT 42.5 07/18/2020   MCV 91.0 07/18/2020  PLT 258 07/18/2020   Lab Results  Component Value Date   NA 140 07/18/2020   K 3.9 07/18/2020   CO2 22 07/18/2020   GLUCOSE 137 (H) 07/18/2020   BUN 18 07/18/2020   CREATININE 0.67 07/18/2020   BILITOT 0.9 07/24/2020   ALKPHOS 45 07/06/2020   AST 10 07/24/2020   ALT 3 (L) 07/24/2020   PROT 6.5 07/24/2020   ALBUMIN 4.0 07/06/2020   CALCIUM 8.6 (L) 07/18/2020   ANIONGAP 10 07/18/2020   GFR 93.65  08/08/2019   Lab Results  Component Value Date   CHOL 144 07/24/2020   Lab Results  Component Value Date   HDL 43 07/24/2020   Lab Results  Component Value Date   LDLCALC 64 07/24/2020   Lab Results  Component Value Date   TRIG 304 (H) 07/24/2020   Lab Results  Component Value Date   CHOLHDL 3.3 07/24/2020   Lab Results  Component Value Date   HGBA1C 6.1 (A) 01/18/2021      Assessment & Plan:   #1 ongoing GERD symptoms in spite of daily use of Protonix 40 mg daily.  Does not have any red flags such as appetite loss, weight loss, dysphagia.  -Handout given on dietary modification -Avoid eating within 3 hours of bedtime -Consider elevate head of bed 4 to 6 inches -consider supplement with Pepcid 20 mg twice daily -Try scaling back his Celebrex to once daily and if possible off -We will set up GI referral  #2 type 2 diabetes well controlled with A1c stable at 6.1% -Continue metformin and Jardiance at current doses  #3 osteoarthritis involving multiple joints -Try scaling Celebrex back to once daily and if possible off  #4 chronic insomnia.  Patient uses Lunesta but not consistently. -Refill for as needed use    No orders of the defined types were placed in this encounter.   Follow-up: No follow-ups on file.    Carolann Littler, MD

## 2021-01-21 DIAGNOSIS — L308 Other specified dermatitis: Secondary | ICD-10-CM | POA: Diagnosis not present

## 2021-01-21 DIAGNOSIS — X32XXXD Exposure to sunlight, subsequent encounter: Secondary | ICD-10-CM | POA: Diagnosis not present

## 2021-01-21 DIAGNOSIS — D1801 Hemangioma of skin and subcutaneous tissue: Secondary | ICD-10-CM | POA: Diagnosis not present

## 2021-01-21 DIAGNOSIS — L570 Actinic keratosis: Secondary | ICD-10-CM | POA: Diagnosis not present

## 2021-01-21 DIAGNOSIS — D1809 Hemangioma of other sites: Secondary | ICD-10-CM | POA: Diagnosis not present

## 2021-01-21 DIAGNOSIS — D225 Melanocytic nevi of trunk: Secondary | ICD-10-CM | POA: Diagnosis not present

## 2021-01-21 DIAGNOSIS — D2372 Other benign neoplasm of skin of left lower limb, including hip: Secondary | ICD-10-CM | POA: Diagnosis not present

## 2021-01-21 DIAGNOSIS — D485 Neoplasm of uncertain behavior of skin: Secondary | ICD-10-CM | POA: Diagnosis not present

## 2021-01-27 ENCOUNTER — Other Ambulatory Visit: Payer: Self-pay | Admitting: Family Medicine

## 2021-01-29 ENCOUNTER — Encounter: Payer: Self-pay | Admitting: Gastroenterology

## 2021-02-08 ENCOUNTER — Other Ambulatory Visit: Payer: Self-pay | Admitting: Family Medicine

## 2021-02-08 NOTE — Telephone Encounter (Signed)
Lorazepam last filled 11/05/2020 Last OV 01/18/2021  Ok to fill?

## 2021-02-10 NOTE — Telephone Encounter (Signed)
[name] was just filled on 01-23-21

## 2021-02-21 ENCOUNTER — Other Ambulatory Visit: Payer: Self-pay | Admitting: Family Medicine

## 2021-02-22 NOTE — Telephone Encounter (Signed)
Please advise. Should the patient continue this medication? 

## 2021-02-26 ENCOUNTER — Encounter: Payer: Self-pay | Admitting: Family Medicine

## 2021-02-26 MED ORDER — GABAPENTIN 100 MG PO CAPS: 100.0000 mg | ORAL_CAPSULE | Freq: Every day | ORAL | 3 refills | Status: DC

## 2021-02-26 NOTE — Telephone Encounter (Signed)
Refills sent.  I am assuming that [employer] is the same as a [employer]?

## 2021-03-01 ENCOUNTER — Encounter: Payer: Self-pay | Admitting: Family Medicine

## 2021-03-04 ENCOUNTER — Other Ambulatory Visit: Payer: Self-pay | Admitting: Family Medicine

## 2021-03-04 MED ORDER — GABAPENTIN 100 MG PO CAPS: 100.0000 mg | ORAL_CAPSULE | Freq: Three times a day (TID) | ORAL | 3 refills | Status: DC

## 2021-03-04 NOTE — Telephone Encounter (Signed)
I renewed the rx for one po tid (#270).  It was listed originally for one po qhs and that threw me off

## 2021-03-05 ENCOUNTER — Other Ambulatory Visit: Payer: Self-pay

## 2021-03-05 MED ORDER — ESZOPICLONE 3 MG PO TABS: 3.0000 mg | ORAL_TABLET | Freq: Every day | ORAL | 0 refills | Status: DC

## 2021-03-05 NOTE — Telephone Encounter (Signed)
Last office visit- 01/18/21  No future visit scheduled.  Can this patient receive a refill?

## 2021-03-22 ENCOUNTER — Encounter: Payer: Self-pay | Admitting: Gastroenterology

## 2021-03-22 ENCOUNTER — Ambulatory Visit (INDEPENDENT_AMBULATORY_CARE_PROVIDER_SITE_OTHER): Payer: BC Managed Care – PPO | Admitting: Gastroenterology

## 2021-03-22 VITALS — BP 112/80 | HR 84 | Ht 74.0 in | Wt 256.0 lb

## 2021-03-22 DIAGNOSIS — K219 Gastro-esophageal reflux disease without esophagitis: Secondary | ICD-10-CM | POA: Diagnosis not present

## 2021-03-22 MED ORDER — FAMOTIDINE 20 MG PO TABS: 20.0000 mg | ORAL_TABLET | Freq: Two times a day (BID) | ORAL | 0 refills | Status: DC

## 2021-03-22 NOTE — Patient Instructions (Addendum)
Please discontinue pantoprazole (Protonix).  Please purchase the following medications over the counter and take as directed: Pepcid 20 mg twice daily (take 1 tablet every morning and 1 tablet every night)  Please call our office (phone [phone] send a MyChart note in about 4 weeks with an update on how your reflux is controlled with the twice daily Pepcid. __________________________________________________________  If you are age 60 or older, your body mass index should be between 23-30. Your Body mass index is 32.87 kg/m. If this is out of the aforementioned range listed, please consider follow up with your Primary Care Provider.  If you are age 30 or younger, your body mass index should be between 19-25. Your Body mass index is 32.87 kg/m. If this is out of the aformentioned range listed, please consider follow up with your Primary Care Provider.   __________________________________________________________  The [facility] providers would like to encourage you to use [facility] to communicate with providers for non-urgent requests or questions.  Due to long hold times on the telephone, sending your provider a message by [facility] may be a faster and more efficient way to get a response.  Please allow 48 business hours for a response.  Please remember that this is for non-urgent requests.   Due to recent changes in healthcare laws, you may see the results of your imaging and laboratory studies on MyChart before your provider has had a chance to review them.  We understand that in some cases there may be results that are confusing or concerning to you. Not all laboratory results come back in the same time frame and the provider may be waiting for multiple results in order to interpret others.  Please give [place] 48 hours in order for your provider to thoroughly review all the results before contacting the office for clarification of your results.   It was a pleasure to see you today!  Thank you  for trusting me with your gastrointestinal care!    Dr.Daniel Ardis Hughs

## 2021-03-22 NOTE — Progress Notes (Signed)
Review of pertinent gastrointestinal problems: 1.  History of adenomatous colon polyps.  Colonoscopy 2012 2 subcentimeter adenomas removed.  Colonoscopy February 2018.  A single subcentimeter hyperplastic polyp was removed.  He was recommended to have repeat colonoscopy 2028.   HPI: This is a very pleasant 60 year old man who was referred to me by Cody Post, MD  to evaluate GERD.    For several months he has had GERD symptoms.  This is mainly waterbrash at night.  He has had no daytime symptoms of acid.  He has had no dysphagia, no weight loss.  He started having waterbrash several months ago and would even vomit at night at times.  He was put on a proton pump inhibitor once daily and this did seem to help for a while but waned in its efficacy.  His primary care physician then recommended he start taking Pepcid twice daily.  He takes his proton pump inhibitor and a Pepcid at about 4:56 PM before dinner.  He takes the other Pepcid when he wakes up in the morning.  He rarely drinks alcohol  He almost never has caffeine in the p.m. hours but does drink coffee in the morning.   Review of systems: Pertinent positive and negative review of systems were noted in the above HPI section. All other review negative.   Past Medical History:  Diagnosis Date   Arthritis    BURSITIS, LEFT HIP 05/20/2010   Chronic kidney disease    stones; 13 stones, currently has 2 stones    Claustrophobia    "take Lorazepam if I have to fly"   DIAB W/O COMP TYPE II/UNS NOT STATED UNCNTRL 03/30/2009   ELEVATED BLOOD PRESSURE 03/30/2009   Fissure, anal    occ bleeds still   Headache(784.0)    "only when I was working; had headaches and migraines"   History of hiatal hernia    Hyperlipidemia 10/11/2010   Insomnia    Irregular heart beat    Numbness and tingling of both feet    Pancreatic carcinoma ([place]) 2010   SHOULDER PAIN, LEFT 10/20/2008   Skin cancer     Past Surgical History:  Procedure Laterality  Date   ANAL FISSURE REPAIR  07/28/2011   Procedure: ANAL FISSURE REPAIR;  Surgeon: Adin Hector, MD;  Location: [facility];  Service: General;  Laterality: N/A;  Repair Anal Fissure/sphinterotomy and excision rectal polypl   BACK SURGERY  2000   "had 2 cages put in"   BIOPSY PANCREAS  2010   benign   CYSTOSCOPY/URETEROSCOPY/HOLMIUM LASER/STENT PLACEMENT Left 07/04/2020   Procedure: CYSTOSCOPY LEFT RETROGRADE URETEROSCOPY/HOLMIUM LASER/STENT PLACEMENT;  Surgeon: Ardis Hughs, MD;  Location: [facility];  Service: Urology;  Laterality: Left;   CYSTOSCOPY/URETEROSCOPY/HOLMIUM LASER/STENT PLACEMENT Bilateral 07/18/2020   Procedure: BILATERAL URETEROSCOPY BILATERAL RETROGRADE PYELOGRAM HOLMIUM LASER/STENT PLACEMENT;  Surgeon: Ardis Hughs, MD;  Location: [facility];  Service: Urology;  Laterality: Bilateral;   HERNIA REPAIR  0223   umbilical   KIDNEY STONES  2010 & 2008 & 2005   LAPAROSCOPIC INCISIONAL / UMBILICAL / VENTRAL HERNIA REPAIR  07/08/11   VHR   left rotator cuff  02/26/2011   left shoulder bone spurs  2011   LIPOMA EXCISION     "several; off back, arms"   pancreatic tumor  2010   right rotator cuff     2011   TONSILLECTOMY  1967   TOTAL KNEE ARTHROPLASTY Left 04/28/2017   Procedure: LEFT TOTAL KNEE ARTHROPLASTY;  Surgeon: Paralee Cancel,  MD;  Location: [facility];  Service: Orthopedics;  Laterality: Left;   VASECTOMY  1994   VENTRAL HERNIA REPAIR  07/08/2011   Procedure: LAPAROSCOPIC VENTRAL HERNIA;  Surgeon: Adin Hector, MD;  Location: [facility];  Service: General;  Laterality: N/A;  Laparoscopic repair ventral hernia with mesh, Lysis of adhesions.    Current Outpatient Medications  Medication Sig Dispense Refill   amoxicillin (AMOXIL) 500 MG capsule TAKE FOUR CAPSULES BY MOUTH ONE HOUR BEFORE DENTAL CLEANING 12 capsule 0   aspirin EC 81 MG tablet Take 81 mg by mouth daily. Swallow whole.     blood glucose meter kit and supplies KIT Use as directed 1 each 0   celecoxib  (CELEBREX) 200 MG capsule TAKE ONE CAPSULE BY MOUTH TWICE A DAY (Patient taking differently: Take 200 mg by mouth daily.) 180 capsule 0   cyclobenzaprine (FLEXERIL) 10 MG tablet TAKE ONE TABLET BY MOUTH EVERY 8 HOURS AS NEEDED FOR MUSCLE SPASMS 60 tablet 0   diphenhydrAMINE (BENADRYL) 25 MG tablet Take 75 mg by mouth every 6 (six) hours as needed for allergies.      Eszopiclone 3 MG TABS Take 1 tablet (3 mg total) by mouth at bedtime. Take immediately before bedtime 30 tablet 0   gabapentin (NEURONTIN) 100 MG capsule Take 1 capsule (100 mg total) by mouth 3 (three) times daily. (Patient taking differently: Take 100 mg by mouth 2 (two) times daily.) 270 capsule 3   glucose blood (CONTOUR TEST) test strip Use test strips twice daily as needed to check blood sugars 100 each 12   JARDIANCE 10 MG TABS tablet TAKE ONE TABLET BY MOUTH DAILY 90 tablet 3   Lancet Device MISC Contour next test lancets.  Test once daily.  Dx E11.9 100 each 3   LORazepam (ATIVAN) 1 MG tablet TAKE ONE TABLET BY MOUTH TWICE A DAY AS NEEDED FOR ANXIETY 30 tablet 0   metFORMIN (GLUCOPHAGE-XR) 500 MG 24 hr tablet TAKE TWO TABLETS BY MOUTH TWICE A DAY 360 tablet 1   Multiple Vitamins-Minerals (CENTRUM PO) Take 1 tablet by mouth daily.     pantoprazole (PROTONIX) 40 MG tablet TAKE ONE TABLET BY MOUTH DAILY 90 tablet 1   potassium citrate (UROCIT-K) 10 MEQ (1080 MG) SR tablet TAKE ONE TABLET BY MOUTH TWICE A DAY 180 tablet 3   rosuvastatin (CRESTOR) 20 MG tablet Take 1 tablet (20 mg total) by mouth daily. (Patient taking differently: Take 20 mg by mouth at bedtime.) 90 tablet 3   tamsulosin (FLOMAX) 0.4 MG CAPS capsule Take 1 capsule (0.4 mg total) by mouth in the morning and at bedtime. 20 capsule 0   No current facility-administered medications for this visit.    Allergies as of 03/22/2021 - Review Complete 03/22/2021  Allergen Reaction Noted   Merthiolate [thimerosol]  12/17/2010   Morphine  03/30/2009   Nickel Dermatitis  04/28/2017    Family History  Problem Relation Age of Onset   Diabetes Brother    Stroke Father    Heart disease Father    Heart attack Father 83   Cancer Maternal Grandfather        colon   Colon cancer Maternal Grandfather 88    Social History   Socioeconomic History   Marital status: Married    Spouse name: Not on file   Number of children: 1   Years of education: Not on file   Highest education level: Not on file  Occupational History   Occupation: Retired-part time  car salesman  Tobacco Use   Smoking status: Never   Smokeless tobacco: Never  Vaping Use   Vaping Use: Never used  Substance and Sexual Activity   Alcohol use: No    Comment: 07/08/11 "maybe 2-3 times a year"   Drug use: No   Sexual activity: Yes  Other Topics Concern   Not on file  Social History Narrative   Not on file   Social Determinants of Health   Financial Resource Strain: Not on file  Food Insecurity: Not on file  Transportation Needs: Not on file  Physical Activity: Not on file  Stress: Not on file  Social Connections: Not on file  Intimate Partner Violence: Not on file     Physical Exam: BP 112/80   Pulse 84   Ht 6' 2"  (1.88 m)   Wt 256 lb (116.1 kg)   BMI 32.87 kg/m  Constitutional: generally well-appearing Psychiatric: alert and oriented x3 Eyes: extraocular movements intact Mouth: oral pharynx moist, no lesions Neck: supple no lymphadenopathy Cardiovascular: heart regular rate and rhythm Lungs: clear to auscultation bilaterally Abdomen: soft, nontender, nondistended, no obvious ascites, no peritoneal signs, normal bowel sounds Extremities: no lower extremity edema bilaterally Skin: no lesions on visible extremities   Assessment and plan: 60 y.o. male with GERD without alarm symptoms  Currently his symptoms are very well controlled on proton pump inhibitor and twice daily H2 blocker.  He is really not taking either of these medicines at the correct time and I think  he might be able to come off of his proton pump inhibitor altogether.  I recommended he stop the Protonix for now and instead take his Pepcid twice daily with the p.m. dose being at bedtime.  He will call to report on how he is doing on this regimen in about 4 weeks.  If he is doing very well at his predominantly bedtime symptoms are well controlled and I would have him cut back even further so that he is taking his H2 blocker only at bedtime.  He has no alarm symptoms and I do not think he needs endoscopic evaluation at this point.   Please see the "Patient Instructions" section for addition details about the plan.   Owens Loffler, MD [facility] 03/22/2021, 1:37 PM  Cc: Cody Post, MD  Total time on date of encounter was 45 minutes (this included time spent preparing to see the patient reviewing records; obtaining and/or reviewing separately obtained history; performing a medically appropriate exam and/or evaluation; counseling and educating the patient and family if present; ordering medications, tests or procedures if applicable; and documenting clinical information in the health record).

## 2021-03-28 ENCOUNTER — Other Ambulatory Visit: Payer: Self-pay | Admitting: Family Medicine

## 2021-04-03 ENCOUNTER — Encounter: Payer: Self-pay | Admitting: Family Medicine

## 2021-04-03 DIAGNOSIS — G8929 Other chronic pain: Secondary | ICD-10-CM

## 2021-04-03 NOTE — Telephone Encounter (Signed)
Please advise. [place] for the referral?

## 2021-04-14 ENCOUNTER — Encounter: Payer: Self-pay | Admitting: Family Medicine

## 2021-04-16 ENCOUNTER — Other Ambulatory Visit: Payer: Self-pay

## 2021-04-16 MED ORDER — FAMOTIDINE 20 MG PO TABS: 20.0000 mg | ORAL_TABLET | Freq: Every day | ORAL | 0 refills | Status: DC

## 2021-04-16 MED ORDER — OMEPRAZOLE 20 MG PO CPDR: 20.0000 mg | DELAYED_RELEASE_CAPSULE | ORAL | 3 refills | Status: DC

## 2021-04-17 ENCOUNTER — Ambulatory Visit (INDEPENDENT_AMBULATORY_CARE_PROVIDER_SITE_OTHER): Payer: BC Managed Care – PPO | Admitting: Family Medicine

## 2021-04-17 ENCOUNTER — Other Ambulatory Visit: Payer: Self-pay

## 2021-04-17 VITALS — BP 122/70 | HR 80 | Temp 98.0°F | Wt 255.4 lb

## 2021-04-17 DIAGNOSIS — F5104 Psychophysiologic insomnia: Secondary | ICD-10-CM | POA: Diagnosis not present

## 2021-04-17 MED ORDER — QUVIVIQ 25 MG PO TABS: 25.0000 mg | ORAL_TABLET | Freq: Every evening | ORAL | 5 refills | Status: DC | PRN

## 2021-04-17 NOTE — Progress Notes (Signed)
Established Patient Office Visit  Subjective:  Patient ID: [name], male    DOB: Feb 26, 1961  Age: 60 y.o. MRN: [id number]  CC:  Chief Complaint  Patient presents with   Medication Refill    Discuss new medication for sleep    HPI [name] presents for discussion of possible change in medications for chronic insomnia.  He is struggled with insomnia for years.  He has difficulty falling asleep and staying asleep.  Without any medication he frequently would get less than 3 hours sleep per night.  He tried multiple things over-the-counter including melatonin and Benadryl without relief.  He tried trazodone and Ambien and did not get much relief.  He was on Lunesta and initially saw some benefit but recently only getting about 3 to 4 hours with Lunesta.  He and his wife specifically had inquired regarding Brynda Peon is a medication that antagonizes Orexin receptors suppressing wakefulness.  He does not recall previously trying Belsomra  Rare alcohol use.  Avoids regular late the use of caffeine.  Tries to avoid bright lights at night.  No daytime napping.  Past Medical History:  Diagnosis Date   Arthritis    BURSITIS, LEFT HIP 05/20/2010   Chronic kidney disease    stones; 13 stones, currently has 2 stones    Claustrophobia    "take Lorazepam if I have to fly"   DIAB W/O COMP TYPE II/UNS NOT STATED UNCNTRL 03/30/2009   ELEVATED BLOOD PRESSURE 03/30/2009   Fissure, anal    occ bleeds still   Headache(784.0)    "only when I was working; had headaches and migraines"   History of hiatal hernia    Hyperlipidemia 10/11/2010   Insomnia    Irregular heart beat    Numbness and tingling of both feet    Pancreatic carcinoma ([place]) 2010   SHOULDER PAIN, LEFT 10/20/2008   Skin cancer     Past Surgical History:  Procedure Laterality Date   ANAL FISSURE REPAIR  07/28/2011   Procedure: ANAL FISSURE REPAIR;  Surgeon: Adin Hector, MD;  [facility]: [facility];  Service: General;   Laterality: N/A;  Repair Anal Fissure/sphinterotomy and excision rectal polypl   BACK SURGERY  2000   "had 2 cages put in"   BIOPSY PANCREAS  2010   benign   CYSTOSCOPY/URETEROSCOPY/HOLMIUM LASER/STENT PLACEMENT Left 07/04/2020   Procedure: CYSTOSCOPY LEFT RETROGRADE URETEROSCOPY/HOLMIUM LASER/STENT PLACEMENT;  Surgeon: Ardis Hughs, MD;  Location: [facility];  Service: Urology;  Laterality: Left;   CYSTOSCOPY/URETEROSCOPY/HOLMIUM LASER/STENT PLACEMENT Bilateral 07/18/2020   Procedure: BILATERAL URETEROSCOPY BILATERAL RETROGRADE PYELOGRAM HOLMIUM LASER/STENT PLACEMENT;  Surgeon: Ardis Hughs, MD;  Location: [facility];  Service: Urology;  Laterality: Bilateral;   HERNIA REPAIR  1324   umbilical   KIDNEY STONES  2010 & 2008 & 2005   LAPAROSCOPIC INCISIONAL / UMBILICAL / VENTRAL HERNIA REPAIR  07/08/11   VHR   left rotator cuff  02/26/2011   left shoulder bone spurs  2011   LIPOMA EXCISION     "several; off back, arms"   pancreatic tumor  2010   right rotator cuff     2011   TONSILLECTOMY  1967   TOTAL KNEE ARTHROPLASTY Left 04/28/2017   Procedure: LEFT TOTAL KNEE ARTHROPLASTY;  Surgeon: Paralee Cancel, MD;  Location: [facility];  Service: Orthopedics;  Laterality: Left;   VASECTOMY  1994   VENTRAL HERNIA REPAIR  07/08/2011   Procedure: LAPAROSCOPIC VENTRAL HERNIA;  Surgeon: Adin Hector, MD;  Location:  [facility];  Service: General;  Laterality: N/A;  Laparoscopic repair ventral hernia with mesh, Lysis of adhesions.    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  Tobacco Use   Smoking status: Never   Smokeless tobacco: Never  Vaping Use   Vaping Use: Never used  Substance and Sexual Activity   Alcohol use: No    Comment: 07/08/11 "maybe 2-3 times a year"   Drug use: No   Sexual activity: Yes  Other Topics Concern   Not on file  Social History Narrative   Not on file   Social Determinants of Health   Financial Resource Strain: Not on file  Food Insecurity: Not on file  Transportation Needs: Not on file  Physical Activity: Not on file  Stress: Not on file  Social Connections: Not on file  Intimate Partner Violence: Not on file    Outpatient Medications Prior to Visit  Medication Sig Dispense Refill   amoxicillin (AMOXIL) 500 MG capsule TAKE FOUR CAPSULES BY MOUTH ONE HOUR BEFORE DENTAL CLEANING 12 capsule 0   aspirin EC 81 MG tablet Take 81 mg by mouth daily. Swallow whole.     blood glucose meter kit and supplies KIT Use as directed 1 each 0   celecoxib (CELEBREX) 200 MG capsule TAKE ONE CAPSULE BY MOUTH TWICE A DAY (Patient taking differently: Take 200 mg by mouth daily.) 180 capsule 0   cyclobenzaprine (FLEXERIL) 10 MG tablet TAKE ONE TABLET BY MOUTH EVERY 8 HOURS AS NEEDED FOR MUSCLE SPASMS 60 tablet 0   diphenhydrAMINE (BENADRYL) 25 MG tablet Take 75 mg by mouth every 6 (six) hours as needed for allergies.      Eszopiclone 3 MG TABS Take 1 tablet (3 mg total) by mouth at bedtime. Take immediately before bedtime 30 tablet 0   famotidine (PEPCID) 20 MG tablet Take 1 tablet (20 mg total) by mouth at bedtime. 1 tablet 0   gabapentin (NEURONTIN) 100 MG capsule Take 1 capsule (100 mg total) by mouth 3 (three) times daily. (Patient taking differently: Take 100 mg by mouth 2 (two) times daily.) 270 capsule 3   glucose blood (CONTOUR TEST) test strip Use test strips twice daily as needed to check blood sugars 100 each 12   JARDIANCE 10 MG TABS tablet TAKE ONE TABLET BY MOUTH DAILY 90 tablet 3   Lancet Device MISC Contour next  test lancets.  Test once daily.  Dx E11.9 100 each 3   LORazepam (ATIVAN) 1 MG tablet TAKE ONE TABLET BY MOUTH TWICE A DAY AS NEEDED FOR ANXIETY 30 tablet 0   metFORMIN (GLUCOPHAGE-XR) 500 MG 24 hr tablet TAKE TWO TABLETS BY MOUTH TWICE A DAY 360 tablet 1   Multiple Vitamins-Minerals (CENTRUM PO) Take 1 tablet by mouth daily.     omeprazole (PRILOSEC) 20 MG capsule Take 1 capsule (20 mg total) by mouth every morning. 90 capsule 3   potassium citrate (UROCIT-K)  10 MEQ (1080 MG) SR tablet TAKE ONE TABLET BY MOUTH TWICE A DAY 180 tablet 3   rosuvastatin (CRESTOR) 20 MG tablet Take 1 tablet (20 mg total) by mouth daily. (Patient taking differently: Take 20 mg by mouth at bedtime.) 90 tablet 3   tamsulosin (FLOMAX) 0.4 MG CAPS capsule Take 1 capsule (0.4 mg total) by mouth in the morning and at bedtime. 20 capsule 0   No facility-administered medications prior to visit.    Allergies  Allergen Reactions   Merthiolate [Thimerosol]     Unknown reaction   Morphine     REACTION: paronia   Nickel Dermatitis    ROS Review of Systems  Constitutional:  Negative for appetite change and unexpected weight change.  Respiratory:  Negative for shortness of breath.   Cardiovascular:  Negative for chest pain.  Psychiatric/Behavioral:  Positive for sleep disturbance. Negative for dysphoric mood.      Objective:    Physical Exam Vitals reviewed.  Cardiovascular:     Rate and Rhythm: Normal rate and regular rhythm.  Neurological:     Mental Status: He is alert.  Psychiatric:        Mood and Affect: Mood normal.    BP 122/70 (BP Location: Left Arm, Patient Position: Sitting, Cuff Size: Normal)   Pulse 80   Temp 98 F (36.7 C) (Oral)   Wt 255 lb 6.4 oz (115.8 kg)   SpO2 98%   BMI 32.79 kg/m  Wt Readings from Last 3 Encounters:  04/17/21 255 lb 6.4 oz (115.8 kg)  03/22/21 256 lb (116.1 kg)  01/18/21 254 lb 6.4 oz (115.4 kg)     Health Maintenance Due  Topic Date Due   FOOT EXAM   05/06/2014   URINE MICROALBUMIN  03/23/2018   COVID-19 Vaccine (4 - Booster for Pfizer series) 09/05/2020   INFLUENZA VACCINE  03/04/2021    There are no preventive care reminders to display for this patient.  Lab Results  Component Value Date   TSH 1.96 08/08/2019   Lab Results  Component Value Date   WBC 6.0 07/18/2020   HGB 14.1 07/18/2020   HCT 42.5 07/18/2020   MCV 91.0 07/18/2020   PLT 258 07/18/2020   Lab Results  Component Value Date   NA 140 07/18/2020   K 3.9 07/18/2020   CO2 22 07/18/2020   GLUCOSE 137 (H) 07/18/2020   BUN 18 07/18/2020   CREATININE 0.67 07/18/2020   BILITOT 0.9 07/24/2020   ALKPHOS 45 07/06/2020   AST 10 07/24/2020   ALT 3 (L) 07/24/2020   PROT 6.5 07/24/2020   ALBUMIN 4.0 07/06/2020   CALCIUM 8.6 (L) 07/18/2020   ANIONGAP 10 07/18/2020   GFR 93.65 08/08/2019   Lab Results  Component Value Date   CHOL 144 07/24/2020   Lab Results  Component Value Date   HDL 43 07/24/2020   Lab Results  Component Value Date   LDLCALC 64 07/24/2020   Lab Results  Component Value Date   TRIG 304 (H) 07/24/2020   Lab Results  Component Value Date   CHOLHDL 3.3 07/24/2020   Lab Results  Component Value Date   HGBA1C 6.1 (A) 01/18/2021      Assessment & Plan:   Chronic insomnia.  Patient has tried multiple medications including Benadryl, trazodone, melatonin, Ambien, Lunesta without relief.  -He specifically expresses interest in Quiviviq-which antagonizes Orexin receptors and reduces wakefulness We reviewed potential side effects.  We explained this should not be mixed with alcohol.  May have difficulty with coverage.  We also discussed that [place] would work very similarly and may have to try Belsomra first if insurance requires this.  Meds ordered this encounter  Medications   Daridorexant HCl (QUVIVIQ) 25 MG TABS    Sig: Take 25 mg by mouth at bedtime as needed.    Dispense:  30 tablet    Refill:  5    Follow-up: No follow-ups  on file.    Carolann Littler, MD

## 2021-04-20 DIAGNOSIS — Z23 Encounter for immunization: Secondary | ICD-10-CM | POA: Diagnosis not present

## 2021-04-22 DIAGNOSIS — M545 Low back pain, unspecified: Secondary | ICD-10-CM | POA: Diagnosis not present

## 2021-04-22 DIAGNOSIS — M5416 Radiculopathy, lumbar region: Secondary | ICD-10-CM | POA: Diagnosis not present

## 2021-04-23 ENCOUNTER — Other Ambulatory Visit: Payer: Self-pay | Admitting: Family Medicine

## 2021-04-23 ENCOUNTER — Other Ambulatory Visit: Payer: Self-pay | Admitting: Orthopedic Surgery

## 2021-04-23 ENCOUNTER — Other Ambulatory Visit (HOSPITAL_COMMUNITY): Payer: Self-pay | Admitting: Orthopedic Surgery

## 2021-04-23 DIAGNOSIS — M5416 Radiculopathy, lumbar region: Secondary | ICD-10-CM

## 2021-04-23 DIAGNOSIS — M545 Low back pain, unspecified: Secondary | ICD-10-CM

## 2021-04-24 ENCOUNTER — Other Ambulatory Visit: Payer: Self-pay | Admitting: Family Medicine

## 2021-04-29 ENCOUNTER — Other Ambulatory Visit: Payer: Self-pay

## 2021-04-29 MED ORDER — ROSUVASTATIN CALCIUM 20 MG PO TABS: 20.0000 mg | ORAL_TABLET | Freq: Every day | ORAL | 0 refills | Status: DC

## 2021-05-02 ENCOUNTER — Encounter: Payer: Self-pay | Admitting: Family Medicine

## 2021-05-03 MED ORDER — QUVIVIQ 50 MG PO TABS: 1.0000 | ORAL_TABLET | Freq: Every day | ORAL | 0 refills | Status: DC

## 2021-05-03 NOTE — Telephone Encounter (Signed)
Done

## 2021-05-08 ENCOUNTER — Telehealth: Payer: Self-pay

## 2021-05-08 NOTE — Telephone Encounter (Signed)
PA started in CoverMyMeds Key: [id number]

## 2021-05-08 NOTE — Telephone Encounter (Signed)
Original Claim Info 33 PA FOR MEDICAL NECESSITY REQUIRED FORCOVERAGE- NEW DRUG COVERAGE [facility] OR SERVICE NOT COVERED. PLEASECONTACT PRESCRIBER*eVoucher*Denial Conversion Denied - 25 day period for funded drug.   Pa Status: CANCELLED - This may mean either your patient does not have active coverage with this plan, this authorization was processed as a duplicate request, or an authorization was not needed for this medication.  Pt was prescribed different dosage of medication on 04/17/21, which is less than 25 days ago. This may be why PA was cancelled.

## 2021-05-09 NOTE — Telephone Encounter (Signed)
Pt declines the Belsomra at this time. Would like to try to re-prescribe the Quviviq 50mg  on 10/14 to see if the PA will go through at that time. Will notify PCP. Gwenette Greet states she will contact us on 10/14 for reminder.

## 2021-05-09 NOTE — Telephone Encounter (Signed)
[place] notified of PA denial for 50mg  due to too early to request a different dosage. States she will discuss options with patient & call back.

## 2021-05-10 NOTE — Telephone Encounter (Signed)
Spoke with the patient. He is aware is PA has been approved and to contact his pharmacy to have his prescription filled.

## 2021-05-10 NOTE — Telephone Encounter (Signed)
Received a fax from [facility] stating that the PA has been approved.

## 2021-05-22 ENCOUNTER — Other Ambulatory Visit: Payer: Self-pay | Admitting: Family Medicine

## 2021-05-23 ENCOUNTER — Other Ambulatory Visit: Payer: Self-pay | Admitting: *Deleted

## 2021-05-23 ENCOUNTER — Encounter: Payer: Self-pay | Admitting: Family Medicine

## 2021-05-23 MED ORDER — ROSUVASTATIN CALCIUM 20 MG PO TABS: 20.0000 mg | ORAL_TABLET | Freq: Every day | ORAL | 0 refills | Status: DC

## 2021-05-24 MED ORDER — ROSUVASTATIN CALCIUM 20 MG PO TABS: 20.0000 mg | ORAL_TABLET | Freq: Every day | ORAL | 0 refills | Status: DC

## 2021-05-28 ENCOUNTER — Ambulatory Visit (HOSPITAL_COMMUNITY)
Admission: RE | Admit: 2021-05-28 | Discharge: 2021-05-28 | Disposition: A | Discharge status: Home / Self Care | Payer: BC Managed Care – PPO | Source: Ambulatory Visit | Attending: Orthopedic Surgery | Admitting: Orthopedic Surgery

## 2021-05-28 DIAGNOSIS — M5416 Radiculopathy, lumbar region: Secondary | ICD-10-CM

## 2021-05-28 DIAGNOSIS — M545 Low back pain, unspecified: Secondary | ICD-10-CM

## 2021-06-09 ENCOUNTER — Other Ambulatory Visit: Payer: Self-pay | Admitting: Family Medicine

## 2021-06-10 ENCOUNTER — Other Ambulatory Visit: Payer: Self-pay

## 2021-06-10 ENCOUNTER — Encounter (HOSPITAL_COMMUNITY): Payer: Self-pay | Admitting: *Deleted

## 2021-06-10 NOTE — Telephone Encounter (Signed)
Dr  Elease Hashimoto pt , Rx prescribed by Dr Sarajane Jews, please advise if ok to send refill

## 2021-06-10 NOTE — Progress Notes (Addendum)
Spoke with pt for pre-op call. Pt denies cardiac history or HTN. Pt is a type 2 diabetic. States he does not check his blood sugar at home. Last A1C was 6.1 on 01/18/21. Instructed pt to not take his Jardiance or Metformin in the AM. He voiced understanding.   Pt's surgery is scheduled as ambulatory so no Covid test is required prior to surgery.

## 2021-06-11 ENCOUNTER — Ambulatory Visit (HOSPITAL_COMMUNITY)
Admission: RE | Admit: 2021-06-11 | Discharge: 2021-06-11 | Disposition: A | Discharge status: Home / Self Care | Payer: BC Managed Care – PPO | Source: Ambulatory Visit | Attending: Orthopedic Surgery | Admitting: Orthopedic Surgery

## 2021-06-11 ENCOUNTER — Ambulatory Visit (HOSPITAL_COMMUNITY)
Admission: RE | Admit: 2021-06-11 | Discharge: 2021-06-11 | Disposition: A | Discharge status: Home / Self Care | Payer: BC Managed Care – PPO | Attending: Orthopedic Surgery | Admitting: Orthopedic Surgery

## 2021-06-11 ENCOUNTER — Ambulatory Visit (HOSPITAL_COMMUNITY): Payer: BC Managed Care – PPO | Admitting: Physician Assistant

## 2021-06-11 ENCOUNTER — Encounter (HOSPITAL_COMMUNITY): Payer: Self-pay

## 2021-06-11 ENCOUNTER — Encounter (HOSPITAL_COMMUNITY): Admission: RE | Disposition: A | Discharge status: Home / Self Care | Payer: Self-pay | Source: Home / Self Care

## 2021-06-11 ENCOUNTER — Other Ambulatory Visit: Payer: Self-pay

## 2021-06-11 DIAGNOSIS — M47816 Spondylosis without myelopathy or radiculopathy, lumbar region: Secondary | ICD-10-CM | POA: Insufficient documentation

## 2021-06-11 DIAGNOSIS — M545 Low back pain, unspecified: Secondary | ICD-10-CM | POA: Diagnosis not present

## 2021-06-11 HISTORY — PX: RADIOLOGY WITH ANESTHESIA: SHX6223

## 2021-06-11 HISTORY — DX: Gastro-esophageal reflux disease without esophagitis: K21.9

## 2021-06-11 HISTORY — DX: Personal history of urinary calculi: Z87.442

## 2021-06-11 LAB — BASIC METABOLIC PANEL
Anion gap: 9 (ref 5–15)
BUN: 14 mg/dL (ref 6–20)
CO2: 23 mmol/L (ref 22–32)
Calcium: 8.6 mg/dL — ABNORMAL LOW (ref 8.9–10.3)
Chloride: 106 mmol/L (ref 98–111)
Creatinine, Ser: 0.54 mg/dL — ABNORMAL LOW (ref 0.61–1.24)
GFR, Estimated: >60 mL/min (ref >60)
Glucose, Bld: 125 mg/dL — ABNORMAL HIGH (ref 70–99)
Potassium: 3.8 mmol/L (ref 3.5–5.1)
Sodium: 138 mmol/L (ref 135–145)

## 2021-06-11 LAB — GLUCOSE, CAPILLARY
Glucose-Capillary: 133 mg/dL — ABNORMAL HIGH (ref 70–99)
Glucose-Capillary: 118 mg/dL — ABNORMAL HIGH (ref 70–99)

## 2021-06-11 SURGERY — MRI WITH ANESTHESIA
Anesthesia: General

## 2021-06-11 MED ORDER — LIDOCAINE 2% (20 MG/ML) 5 ML SYRINGE
INTRAMUSCULAR | Status: DC | PRN
  Administered 2021-06-11: 60 mg via INTRAVENOUS

## 2021-06-11 MED ORDER — LACTATED RINGERS IV SOLN: INTRAVENOUS | Status: DC

## 2021-06-11 MED ORDER — FENTANYL CITRATE (PF) 100 MCG/2ML IJ SOLN
INTRAMUSCULAR | Status: DC | PRN
  Administered 2021-06-11: 100 ug via INTRAVENOUS

## 2021-06-11 MED ORDER — MIDAZOLAM HCL 2 MG/2ML IJ SOLN
INTRAMUSCULAR | Status: DC | PRN
Start: 2021-06-11 — End: 2021-06-11
  Administered 2021-06-11: 2 mg via INTRAVENOUS

## 2021-06-11 MED ORDER — ONDANSETRON HCL 4 MG/2ML IJ SOLN: 4.0000 mg | Freq: Four times a day (QID) | INTRAMUSCULAR | Status: DC | PRN

## 2021-06-11 MED ORDER — SUGAMMADEX SODIUM 200 MG/2ML IV SOLN
INTRAVENOUS | Status: DC | PRN
  Administered 2021-06-11: 200 mg via INTRAVENOUS

## 2021-06-11 MED ORDER — PROPOFOL 10 MG/ML IV BOLUS
INTRAVENOUS | Status: DC | PRN
  Administered 2021-06-11: 200 mg via INTRAVENOUS

## 2021-06-11 MED ORDER — ORAL CARE MOUTH RINSE: 15.0000 mL | Freq: Once | OROMUCOSAL | Status: AC

## 2021-06-11 MED ORDER — CHLORHEXIDINE GLUCONATE 0.12 % MT SOLN
15.0000 mL | Freq: Once | OROMUCOSAL | Status: AC
  Administered 2021-06-11: 15 mL via OROMUCOSAL
  Filled 2021-06-11: qty 15

## 2021-06-11 MED ORDER — ROCURONIUM BROMIDE 10 MG/ML (PF) SYRINGE
PREFILLED_SYRINGE | INTRAVENOUS | Status: DC | PRN
  Administered 2021-06-11: 60 mg via INTRAVENOUS

## 2021-06-11 MED ORDER — PHENYLEPHRINE 40 MCG/ML (10ML) SYRINGE FOR IV PUSH (FOR BLOOD PRESSURE SUPPORT)
PREFILLED_SYRINGE | INTRAVENOUS | Status: DC | PRN
  Administered 2021-06-11: 80 ug via INTRAVENOUS

## 2021-06-11 MED ORDER — FENTANYL CITRATE (PF) 100 MCG/2ML IJ SOLN: 25.0000 ug | INTRAMUSCULAR | Status: DC | PRN

## 2021-06-11 MED ORDER — DEXAMETHASONE SODIUM PHOSPHATE 10 MG/ML IJ SOLN
INTRAMUSCULAR | Status: DC | PRN
Start: 2021-06-11 — End: 2021-06-11
  Administered 2021-06-11: 5 mg via INTRAVENOUS

## 2021-06-11 MED ORDER — ONDANSETRON HCL 4 MG/2ML IJ SOLN
INTRAMUSCULAR | Status: DC | PRN
  Administered 2021-06-11: 4 mg via INTRAVENOUS

## 2021-06-11 NOTE — Anesthesia Procedure Notes (Signed)
Procedure Name: Intubation Date/Time: 06/11/2021 11:34 AM Performed by: Reece Agar, CRNA Pre-anesthesia Checklist: Patient identified, Emergency Drugs available, Suction available and Patient being monitored Patient Re-evaluated:Patient Re-evaluated prior to induction Oxygen Delivery Method: Circle System Utilized Preoxygenation: Pre-oxygenation with 100% oxygen Induction Type: IV induction Ventilation: Two handed mask ventilation required and Mask ventilation without difficulty Laryngoscope Size: Glidescope and 4 Grade View: Grade I Tube type: Oral Tube size: 7.5 mm Number of attempts: 1 Airway Equipment and Method: Rigid stylet and Video-laryngoscopy Placement Confirmation: ETT inserted through vocal cords under direct vision, positive ETCO2 and breath sounds checked- equal and bilateral Secured at: 23 cm Tube secured with: Tape Dental Injury: Teeth and Oropharynx as per pre-operative assessment

## 2021-06-11 NOTE — Anesthesia Preprocedure Evaluation (Signed)
Anesthesia Evaluation  Patient identified by MRN, date of birth, ID band Patient awake    Reviewed: Allergy & Precautions, H&P , NPO status , Patient's Chart, lab work & pertinent test results  Airway Mallampati: II   Neck ROM: full    Dental   Pulmonary neg pulmonary ROS,    breath sounds clear to auscultation       Cardiovascular negative cardio ROS   Rhythm:regular Rate:Normal     Neuro/Psych  Headaches, PSYCHIATRIC DISORDERS Anxiety    GI/Hepatic hiatal hernia, GERD  ,  Endo/Other  diabetes, Type 2  Renal/GU stones     Musculoskeletal  (+) Arthritis ,   Abdominal   Peds  Hematology   Anesthesia Other Findings   Reproductive/Obstetrics                             Anesthesia Physical Anesthesia Plan  ASA: 2  Anesthesia Plan: General   Post-op Pain Management:    Induction: Intravenous  PONV Risk Score and Plan: 2 and Ondansetron, Dexamethasone, Midazolam and Treatment may vary due to age or medical condition  Airway Management Planned: Oral ETT  Additional Equipment:   Intra-op Plan:   Post-operative Plan: Extubation in OR  Informed Consent: I have reviewed the patients History and Physical, chart, labs and discussed the procedure including the risks, benefits and alternatives for the proposed anesthesia with the patient or authorized representative who has indicated his/her understanding and acceptance.     Dental advisory given  Plan Discussed with: CRNA, Anesthesiologist and Surgeon  Anesthesia Plan Comments:         Anesthesia Quick Evaluation

## 2021-06-11 NOTE — Transfer of Care (Signed)
Immediate Anesthesia Transfer of Care Note  Patient: [name]  Procedure(s) Performed: MRI LUMBAR SPINE WITHOUT CONTRAST WITH ANESTHESIA  Patient Location: PACU  Anesthesia Type:General  Level of Consciousness: awake and alert   Airway & Oxygen Therapy: Patient Spontanous Breathing and Patient connected to face mask oxygen  Post-op Assessment: Report given to RN and Post -op Vital signs reviewed and stable  Post vital signs: Reviewed and stable  Last Vitals:  Vitals Value Taken Time  BP 130/62 06/11/21 1228  Temp 36.9 06/11/21 1228  Pulse 81 06/11/21 1229  Resp 13 06/11/21 1229  SpO2 95 % 06/11/21 1229  Vitals shown include unvalidated device data.  Last Pain:  Vitals:   06/11/21 0814  TempSrc:   PainSc: 5       Patients Stated Pain Goal: 0 (98/02/21 7981)  Complications: No notable events documented.

## 2021-06-12 ENCOUNTER — Encounter (HOSPITAL_COMMUNITY): Payer: Self-pay | Admitting: Radiology

## 2021-06-14 DIAGNOSIS — M545 Low back pain, unspecified: Secondary | ICD-10-CM | POA: Diagnosis not present

## 2021-06-15 NOTE — Anesthesia Postprocedure Evaluation (Signed)
Anesthesia Post Note  Patient: [name]  Procedure(s) Performed: MRI LUMBAR SPINE WITHOUT CONTRAST WITH ANESTHESIA     Patient location during evaluation: PACU Anesthesia Type: General Level of consciousness: awake and alert Pain management: pain level controlled Vital Signs Assessment: post-procedure vital signs reviewed and stable Respiratory status: spontaneous breathing, nonlabored ventilation, respiratory function stable and patient connected to nasal cannula oxygen Cardiovascular status: blood pressure returned to baseline and stable Postop Assessment: no apparent nausea or vomiting Anesthetic complications: no   No notable events documented.  Last Vitals:  Vitals:   06/11/21 1245 06/11/21 1300  BP: 124/70 129/77  Pulse: 71 75  Resp: 10 13  Temp:  36.7 C  SpO2: 96% 96%    Last Pain:  Vitals:   06/11/21 1300  TempSrc:   PainSc: 0-No pain                 Kajuan Guyton S

## 2021-06-23 ENCOUNTER — Other Ambulatory Visit: Payer: Self-pay | Admitting: Family Medicine

## 2021-07-14 ENCOUNTER — Other Ambulatory Visit: Payer: Self-pay | Admitting: Family Medicine

## 2021-07-24 ENCOUNTER — Other Ambulatory Visit: Payer: Self-pay | Admitting: Family Medicine

## 2021-08-05 ENCOUNTER — Other Ambulatory Visit: Payer: Self-pay | Admitting: Family Medicine

## 2021-08-06 NOTE — Telephone Encounter (Signed)
Last filled 02/10/2021 Last OV 04/17/2021  Ok to fill?

## 2021-08-20 ENCOUNTER — Other Ambulatory Visit: Payer: Self-pay

## 2021-08-20 ENCOUNTER — Encounter: Payer: Self-pay | Admitting: Neurology

## 2021-08-20 ENCOUNTER — Ambulatory Visit (INDEPENDENT_AMBULATORY_CARE_PROVIDER_SITE_OTHER): Payer: BC Managed Care – PPO | Admitting: Neurology

## 2021-08-20 VITALS — BP 133/86 | HR 67 | Ht 74.0 in | Wt 268.2 lb

## 2021-08-20 DIAGNOSIS — R5383 Other fatigue: Secondary | ICD-10-CM | POA: Diagnosis not present

## 2021-08-20 DIAGNOSIS — E538 Deficiency of other specified B group vitamins: Secondary | ICD-10-CM

## 2021-08-20 NOTE — Patient Instructions (Addendum)
[facility] [phone] because central obesity is a cause for neuropathy and worsens diabetes and EVEN pre-diabetes can cause neuropathy -May consider daily alpha lipoic acid which is an antioxidant that may reduce free radical oxidative stress associated with diabetic polyneuropathy, existing evidence suggests that alpha lipoic acid significantly reduces stabbing, lancinating and burning pain and diabetic neuropathy with its onset of action as early as 1-2 weeks. 600mg  1-2x a day - best thing to do is decrease hgba1c and los weight - follow up with Dr. Elease Hashimoto - CTS splinting, conservative -[email or web address]#:~:text=Small%55fiber%20sensory%20neuropathy%20(SFSN,called%20a%20length%2Ddependent%20SFSN.  Peripheral Neuropathy Peripheral neuropathy is a type of nerve damage. It affects nerves that carry signals between the spinal cord and the arms, legs, and the rest of the body (peripheral nerves). It does not affect nerves in the spinal cord or brain. In peripheral neuropathy, one nerve or a group of nerves may be damaged. Peripheral neuropathy is a broad category that includes many specific nerve disorders, like diabetic neuropathy, hereditary neuropathy, and carpal tunnel syndrome. What are the causes? This condition may be caused by: Diabetes. This is the most common cause of peripheral neuropathy. Nerve injury. Pressure or stress on a nerve that lasts a long time. Lack (deficiency) of B vitamins. This can result from alcoholism, poor diet, or a restricted diet. Infections. Autoimmune diseases, such as rheumatoid arthritis and systemic lupus erythematosus. Nerve diseases that are passed from parent to child (inherited). Some medicines, such as cancer medicines (chemotherapy). Poisonous (toxic) substances, such as lead and mercury, alcohol Too little blood  flowing to the legs. Kidney disease. Thyroid disease. In some cases, the cause of this condition is not known. What are the signs or symptoms? Symptoms of this condition depend on which of your nerves is damaged. Common symptoms include: Loss of feeling (numbness) in the feet, hands, or both. Tingling in the feet, hands, or both. Burning pain. Very sensitive skin. Weakness. Not being able to move a part of the body (paralysis). Muscle twitching. Clumsiness or poor coordination. Loss of balance. Not being able to control your bladder. Feeling dizzy. Sexual problems. How is this diagnosed? Diagnosing and finding the cause of peripheral neuropathy can be difficult. Your health care provider will take your medical history and do a physical exam. A neurological exam will also be done. This involves checking things that are affected by your brain, spinal cord, and nerves (nervous system). For example, your health care provider will check your reflexes, how you move, and what you can feel. You may have other tests, such as: Blood tests. Electromyogram (EMG) and nerve conduction tests. These tests check nerve function and how well the nerves are controlling the muscles. Imaging tests, such as CT scans or MRI to rule out other causes of your symptoms. Removing a small piece of nerve to be examined in a lab (nerve biopsy). Removing and examining a small amount of the fluid that surrounds the brain and spinal cord (lumbar puncture). How is this treated? Treatment for this condition may involve: Treating the underlying cause of the neuropathy, such as diabetes, kidney disease, or vitamin deficiencies. Stopping medicines that can cause neuropathy, such as chemotherapy. Medicine to help relieve pain. Medicines may include: Prescription or over-the-counter pain medicine. Antiseizure medicine. Antidepressants. Pain-relieving patches that are applied to painful areas of skin. Surgery to relieve  pressure on a nerve or to destroy a nerve that is causing pain. Physical therapy to help improve movement and balance. Devices to help you move around (assistive devices). Follow  these instructions at home: Medicines Take over-the-counter and prescription medicines only as told by your health care provider. Do not take any other medicines without first asking your health care provider. Do not drive or use heavy machinery while taking prescription pain medicine. Lifestyle  Do not use any products that contain nicotine or tobacco, such as cigarettes and e-cigarettes. Smoking keeps blood from reaching damaged nerves. If you need help quitting, ask your health care provider. Avoid or limit alcohol. Too much alcohol can cause a vitamin B deficiency, and vitamin B is needed for healthy nerves. Eat a healthy diet. This includes: Eating foods that are high in fiber, such as fresh fruits and vegetables, whole grains, and beans. Limiting foods that are high in fat and processed sugars, such as fried or sweet foods. General instructions  If you have diabetes, work closely with your health care provider to keep your blood sugar under control. If you have numbness in your feet: Check every day for signs of injury or infection. Watch for redness, warmth, and swelling. Wear padded socks and comfortable shoes. These help protect your feet. Develop a good support system. Living with peripheral neuropathy can be stressful. Consider talking with a mental health specialist or joining a support group. Use assistive devices and attend physical therapy as told by your health care provider. This may include using a walker or a cane. Keep all follow-up visits as told by your health care provider. This is important. Contact a health care provider if: You have new signs or symptoms of peripheral neuropathy. You are struggling emotionally from dealing with peripheral neuropathy. Your pain is not well-controlled. Get  help right away if: You have an injury or infection that is not healing normally. You develop new weakness in an arm or leg. You have fallen or do so frequently. Summary Peripheral neuropathy is when the nerves in the arms, or legs are damaged, resulting in numbness, weakness, or pain. There are many causes of peripheral neuropathy, including diabetes, pinched nerves, vitamin deficiencies, autoimmune disease, and hereditary conditions. Diagnosing and finding the cause of peripheral neuropathy can be difficult. Your health care provider will take your medical history, do a physical exam, and do tests, including blood tests and nerve function tests. Treatment involves treating the underlying cause of the neuropathy and taking medicines to help control pain. Physical therapy and assistive devices may also help. This information is not intended to replace advice given to you by your health care provider. Make sure you discuss any questions you have with your health care provider. Document Revised: 05/01/2020 Document Reviewed: 05/01/2020 Elsevier Patient Education  2022 [place] Syndrome Carpal tunnel syndrome is a condition that causes pain, numbness, and weakness in your hand and fingers. The carpal tunnel is a narrow area located on the palm side of your wrist. Repeated wrist motion or certain diseases may cause swelling within the tunnel. This swelling pinches the main nerve in the wrist. The main nerve in the wrist is called the median nerve. What are the causes? This condition may be caused by: Repeated and forceful wrist and hand motions. Wrist injuries. Arthritis. A cyst or tumor in the carpal tunnel. Fluid buildup during pregnancy. Use of tools that vibrate. Sometimes the cause of this condition is not known. What increases the risk? The following factors may make you more likely to develop this condition: Having a job that requires you to repeatedly or  forcefully move your wrist or hand or  requires you to use tools that vibrate. This may include jobs that involve using computers, working on an [employer], or working with [place] such as Pension scheme manager. Being a woman. Having certain conditions, such as: Diabetes. Obesity. An underactive thyroid (hypothyroidism). Kidney failure. Rheumatoid arthritis. What are the signs or symptoms? Symptoms of this condition include: A tingling feeling in your fingers, especially in your thumb, index, and middle fingers. Tingling or numbness in your hand. An aching feeling in your entire arm, especially when your wrist and elbow are bent for a long time. Wrist pain that goes up your arm to your shoulder. Pain that goes down into your palm or fingers. A weak feeling in your hands. You may have trouble grabbing and holding items. Your symptoms may feel worse during the night. How is this diagnosed? This condition is diagnosed with a medical history and physical exam. You may also have tests, including: Electromyogram (EMG). This test measures electrical signals sent by your nerves into the muscles. Nerve conduction study. This test measures how well electrical signals pass through your nerves. Imaging tests, such as X-rays, ultrasound, and MRI. These tests check for possible causes of your condition. How is this treated? This condition may be treated with: Lifestyle changes. It is important to stop or change the activity that caused your condition. Doing exercise and activities to strengthen and stretch your muscles and tendons (physical therapy). Making lifestyle changes to help with your condition and learning how to do your daily activities safely (occupational therapy). Medicines for pain and inflammation. This may include medicine that is injected into your wrist. A wrist splint or brace. Surgery. Follow these instructions at home: If you have a splint or brace: Wear the splint or brace  as told by your health care provider. Remove it only as told by your health care provider. Loosen the splint or brace if your fingers tingle, become numb, or turn cold and blue. Keep the splint or brace clean. If the splint or brace is not waterproof: Do not let it get wet. Cover it with a watertight covering when you take a bath or shower. Managing pain, stiffness, and swelling If directed, put ice on the painful area. To do this: If you have a removeable splint or brace, remove it as told by your health care provider. Put ice in a plastic bag. Place a towel between your skin and the bag or between the splint or brace and the bag. Leave the ice on for 20 minutes, 2-3 times a day. Do not fall asleep with the cold pack on your skin. Remove the ice if your skin turns bright red. This is very important. If you cannot feel pain, heat, or cold, you have a greater risk of damage to the area. Move your fingers often to reduce stiffness and swelling. General instructions Take over-the-counter and prescription medicines only as told by your health care provider. Rest your wrist and hand from any activity that may be causing your pain. If your condition is work related, talk with your employer about changes that can be made, such as getting a wrist pad to use while typing. Do any exercises as told by your health care provider, physical therapist, or occupational therapist. Keep all follow-up visits. This is important. Contact a health care provider if: You have new symptoms. Your pain is not controlled with medicines. Your symptoms get worse. Get help right away if: You have severe numbness or tingling in your wrist or  hand. Summary Carpal tunnel syndrome is a condition that causes pain, numbness, and weakness in your hand and fingers. It is usually caused by repeated wrist motions. Lifestyle changes and medicines are used to treat carpal tunnel syndrome. Surgery may be recommended. Follow your  health care provider's instructions about wearing a splint, resting from activity, keeping follow-up visits, and calling for help. This information is not intended to replace advice given to you by your health care provider. Make sure you discuss any questions you have with your health care provider. Document Revised: 12/01/2019 Document Reviewed: 12/01/2019 Elsevier Patient Education  2022 [place] is a test to check how well your muscles and nerves are working. This procedure includes the combined use of electromyogram (EMG) and nerve conduction study (NCS). EMG is used to evaluate muscles and the nerves that control those muscles. NCS, which is also called electroneurogram, measures how well your nerves conduct electricity. The procedures should be done together to check if your muscles and nerves are healthy. If the results of the tests are abnormal, this may indicate disease or injury, such as a neuromuscular disease or peripheral nerve damage. Tell a health care provider about: Any allergies you have. All medicines you are taking, including vitamins, herbs, eye drops, creams, and over-the-counter medicines. Any bleeding problems you have. Any surgeries you have had. Any medical conditions you have. What are the risks? Generally, this is a safe procedure. However, problems may occur, including: Bleeding or bruising. Infection where the electrodes were inserted. What happens before the test? Medicines Take all of your usually prescribed medications before this testing is performed. Do not stop your blood thinners unless advised by your prescribing physician. General instructions Your health care provider may ask you to warm the limb that will be checked with warm water, hot pack, or wrapping the limb in a blanket. Do not use lotions or creams on the same day that you will be having the procedure. What happens during the test? For EMG  Your  health care provider will ask you to stay in a position so that the muscle being studied can be accessed. You will be sitting or lying down. You may be given a medicine to numb the area (local anesthetic) and the skin will be disinfected. A very thin needle that has an electrode will be inserted into your muscle, one muscle at a time. Typically, multiple muscles are evaluated during a single study. Another small electrode will be placed on your skin near the muscle. Your health care provider will ask you to continue to remain still. The electrodes will record the electrical activity of your muscles. You may see this on a monitor or hear it in the room. After your muscles have been studied at rest, your health care provider will ask you to contract or flex your muscles. The electrodes will record the electrical activity of your muscles. Your health care provider will remove the electrodes and the electrode needle when the procedure is finished. The procedure may vary among health care providers and hospitals. For NCS  An electrode that records your nerve activity (recording electrode) will be placed on your skin by the muscle that is being studied. An electrode that is used as a reference (reference electrode) will be placed near the recording electrode. A paste or gel will be applied to your skin between the recording electrode and the reference electrode. Your nerve will be stimulated with a mild shock. The speed of the nerves  and strength of response is recorded by the electrodes. Your health care provider will remove the electrodes and the gel when the procedure is finished. The procedure may vary among health care providers and hospitals. What can I expect after the test? It is up to you to get your test results. Ask your health care provider, or the department that is doing the test, when your results will be ready. Your health care provider may: Give you medicines for any pain. Monitor the  insertion sites to make sure that bleeding stops. You should be able to drive yourself to and from the test. Discomfort can persist for a few hours after the test, but should be better the next day. Contact a health care provider if: You have swelling, redness, or drainage at any of the insertion sites. Summary Electromyoneurogram is a test to check how well your muscles and nerves are working. If the results of the tests are abnormal, this may indicate disease or injury. This is a safe procedure. However, problems may occur, such as bleeding and infection. Your health care provider will do two tests to complete this procedure. One checks your muscles (EMG) and another checks your nerves (NCS). It is up to you to get your test results. Ask your health care provider, or the department that is doing the test, when your results will be ready. This information is not intended to replace advice given to you by your health care provider. Make sure you discuss any questions you have with your health care provider. Document Revised: 04/03/2021 Document Reviewed: 03/03/2021 Elsevier Patient Education  [place].

## 2021-08-20 NOTE — Progress Notes (Signed)
[facility]    Provider:  Dr Jaynee Eagles Requesting Provider: Phylliss Bob, MD Primary Care Provider:  [name], MD  CC:  neuropathy in setting diabetes  HPI:  [name] is a 61 y.o. male here as requested by Phylliss Bob, MD for peripheral neuropathy.  Arthritis, diabetes, elevated blood pressure, headache, hyperlipidemia, insomnia, numbness and tingling's of both feet.  I reviewed notes by Dr. Trixie Dredge orthopedic and sports medicine, who was seen with tingling in the bilateral legs particular in his feet, of note he is status post L5-S1 fusion for left leg pain and does state that surgery did help, pain and tingling in the feet for about 2 years, not explained by the patient's MRI, MRI is very benign and negative for compression or arachnoiditis, he was referred to neurology he has type 2 diabetes.  Started over 3 years ago. Walks up to 10 miles a lot. Started having burning in the feet toes and the alls. Slowly progrssed since then. HgbA1c last 6.1 but was over 9. He tried to lose weight. Also discussed central obesity as a cause. He feels it all the time. Went to podiatry. Went to ortho to evaluate for back. One foot is the same as the other, symmetric, getting sharp pains, hands get cold and left first 3 digits bother him. Not worse in the morning or waking hm up. No autoimmune disorders or hx f neuropathy In the family. Not in an old home, not exposed to toxins like lead. No other focal neurologic deficits, associated symptoms, inciting events or modifiable factors.  Reviewed notes, labs and imaging from outside physicians, which showed:  BMP BUN 14, creatinine 0.54  I reviewed MRI of the lumbar spine report impression: No spinal canal stenosis or neuroforaminal narrowing, mild degenerative changes with mild disc height loss at L2-L3 and L4-L5.  Review of Systems: Patient complains of symptoms per HPI as well as the following symptoms paresthesias.  Pertinent negatives and positives per HPI. All others negative.   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 [employer].  (Makes biscuits) Retired.  Wife. 1 kid.    Social Determinants of Health   Financial Resource Strain: Not on file  Food Insecurity: Not on file  Transportation Needs: Not on file  Physical Activity: Not on file  Stress: Not on file  Social Connections: Not on file  Intimate Partner Violence: Not on file    Family History  Problem Relation Age of Onset   Diabetes Brother    Stroke Father    Heart disease Father    Heart attack Father 81   Cancer Maternal Grandfather        colon   Colon cancer Maternal Grandfather 64    Past Medical History:  Diagnosis Date   Arthritis  BURSITIS, LEFT HIP 05/20/2010   Claustrophobia    "take Lorazepam if I have to fly"   DIAB W/O COMP TYPE II/UNS NOT STATED UNCNTRL 03/30/2009   ELEVATED BLOOD PRESSURE 03/30/2009   Fissure, anal    occ bleeds still   GERD (gastroesophageal reflux disease)    Headache(784.0)    "only when I was working; had headaches and migraines"   History of hiatal hernia    History of kidney stones    has had 17 stones, still has one   Hyperlipidemia 10/11/2010   Insomnia    Irregular heart beat    Numbness and tingling of both feet    Pancreatic carcinoma ([place]) 2010   SHOULDER PAIN, LEFT 10/20/2008   Skin cancer     Patient  Active Problem List   Diagnosis Date Noted   Pyelonephritis 07/05/2020   Left ureteral calculus 07/05/2020   S/P left TKA 04/28/2017   S/P total knee replacement 04/28/2017   Situational anxiety 03/17/2016   Low testosterone 03/13/2015   Osteoarthritis, multiple sites 08/22/2013   Obesity (BMI 30-39.9) 05/08/2013   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   Liver nodule 10/07/2012   History of kidney stones 08/27/2012   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   Rotator cuff tear, left 01/23/2011   Hyperlipidemia 10/11/2010   BURSITIS, LEFT HIP 05/20/2010   Controlled type 2 diabetes mellitus without complication ([place]) 38/93/7342   CHRONIC TENSION TYPE HEADACHE 03/30/2009   ELEVATED BLOOD PRESSURE 03/30/2009   SHOULDER PAIN, LEFT 10/20/2008   CONTUSION OF KNEE 10/20/2008   ABNORMAL FINDINGS GI TRACT 06/23/2008    Past Surgical History:  Procedure Laterality Date   ANAL FISSURE REPAIR  07/28/2011   Procedure: ANAL FISSURE REPAIR;  Surgeon: Adin Hector, MD;  Location: [facility];  Service: General;  Laterality: N/A;  Repair Anal Fissure/sphinterotomy and excision rectal polypl   BACK SURGERY  2000   "had 2 cages put in"   BIOPSY PANCREAS  2010   benign   CYSTOSCOPY/URETEROSCOPY/HOLMIUM LASER/STENT PLACEMENT Left 07/04/2020   Procedure: CYSTOSCOPY LEFT RETROGRADE URETEROSCOPY/HOLMIUM LASER/STENT PLACEMENT;  Surgeon: Ardis Hughs, MD;  Location: [facility];  Service: Urology;  Laterality: Left;   CYSTOSCOPY/URETEROSCOPY/HOLMIUM LASER/STENT PLACEMENT Bilateral 07/18/2020   Procedure: BILATERAL URETEROSCOPY BILATERAL RETROGRADE PYELOGRAM HOLMIUM LASER/STENT PLACEMENT;  Surgeon: Ardis Hughs, MD;  Location: [facility];  Service: Urology;  Laterality: Bilateral;   HERNIA REPAIR  8768   umbilical   KIDNEY STONES  2010 & 2008 & 2005   LAPAROSCOPIC INCISIONAL / UMBILICAL / VENTRAL HERNIA REPAIR  07/08/11   VHR   left rotator cuff  02/26/2011   left shoulder bone spurs  2011    LIPOMA EXCISION     "several; off back, arms"   pancreatic tumor  2010   RADIOLOGY WITH ANESTHESIA N/A 06/11/2021   Procedure: MRI LUMBAR SPINE WITHOUT CONTRAST WITH ANESTHESIA;  Surgeon: Radiologist, Medication, MD;  Location: [facility];  Service: Radiology;  Laterality: N/A;   right rotator cuff     2011   TONSILLECTOMY  1967   TOTAL KNEE ARTHROPLASTY Left 04/28/2017   Procedure: LEFT TOTAL KNEE ARTHROPLASTY;  Surgeon: Paralee Cancel, MD;  [facility];  Service: Orthopedics;  Laterality: Left;   VASECTOMY  1994   VENTRAL HERNIA REPAIR  07/08/2011   Procedure: LAPAROSCOPIC VENTRAL HERNIA;  Surgeon: Adin Hector, MD;  Location: [facility];  Service: General;  Laterality: N/A;  Laparoscopic repair ventral hernia with mesh, Lysis of adhesions.    Current Outpatient  Medications  Medication Sig Dispense Refill   amoxicillin (AMOXIL) 500 MG capsule TAKE FOUR CAPSULES BY MOUTH ONE HOUR BEFORE DENTAL CLEANING 12 capsule 0   aspirin EC 81 MG tablet Take 81 mg by mouth daily with supper. Swallow whole.     blood glucose meter kit and supplies KIT Use as directed 1 each 0   celecoxib (CELEBREX) 200 MG capsule TAKE ONE CAPSULE BY MOUTH TWICE A DAY 180 capsule 0   cyclobenzaprine (FLEXERIL) 10 MG tablet TAKE ONE TABLET BY MOUTH EVERY 8 HOURS AS NEEDED FOR MUSCLE SPASMS 60 tablet 0   Daridorexant HCl (QUVIVIQ) 50 MG TABS Take 50 mg by mouth at bedtime. 30 tablet 2   diphenhydrAMINE (BENADRYL) 25 MG tablet Take 75 mg by mouth every 6 (six) hours as needed for allergies.      glucose blood (CONTOUR TEST) test strip Use test strips twice daily as needed to check blood sugars 100 each 12   JARDIANCE 10 MG TABS tablet TAKE ONE TABLET BY MOUTH DAILY 90 tablet 3   Lancet Device MISC Contour next test lancets.  Test once daily.  Dx E11.9 100 each 3   LORazepam (ATIVAN) 1 MG tablet TAKE ONE TABLET BY MOUTH TWICE A DAY AS NEEDED FOR ANXIETY 30 tablet 0   metFORMIN (GLUCOPHAGE-XR) 500 MG 24 hr tablet TAKE TWO  TABLETS BY MOUTH TWICE A DAY 360 tablet 1   Multiple Vitamins-Minerals (CENTRUM PO) Take 1 tablet by mouth daily.     rosuvastatin (CRESTOR) 20 MG tablet Take 1 tablet (20 mg total) by mouth daily. 90 tablet 0   No current facility-administered medications for this visit.    Allergies as of 08/20/2021 - Review Complete 08/20/2021  Allergen Reaction Noted   Merthiolate [thimerosol]  12/17/2010   Morphine  03/30/2009   Nickel Dermatitis 04/28/2017   Pravachol [pravastatin]  08/20/2021   Trazodone  08/20/2021    Vitals: BP 133/86    Pulse 67    Ht _0  (1.88 m)    Wt 268 lb 3.2 oz (121.7 kg)    BMI 34.43 kg/m  Last Weight:  Wt Readings from Last 1 Encounters:  08/20/21 268 lb 3.2 oz (121.7 kg)   Last Height:   Ht Readings from Last 1 Encounters:  08/20/21 _1  (1.88 m)     Physical exam: Exam: Gen: NAD, conversant, well nourised, obese, well groomed                     CV: RRR, no MRG. No Carotid Bruits. No peripheral edema, warm, nontender Eyes: Conjunctivae clear without exudates or hemorrhage  Neuro: Detailed Neurologic Exam  Speech:    Speech is normal; fluent and spontaneous with normal comprehension.  Cognition:    The patient is oriented to person, place, and time;     recent and remote memory intact;     language fluent;     normal attention, concentration,     fund of knowledge Cranial Nerves:    The pupils are equal, round, and reactive to light. The fundi too small to visualize fundi. Visual fields are full to finger confrontation. Extraocular movements are intact. Trigeminal sensation is intact and the muscles of mastication are normal. The face is symmetric. The palate elevates in the midline. Hearing intact. Voice is normal. Shoulder shrug is normal. The tongue has normal motion without fasciculations.   Coordination:    Normal   Gait:    normal.   Motor Observation:  No asymmetry, no atrophy, and no involuntary movements noted. Tone:    Normal  muscle tone.    Posture:    Posture is normal. normal erect    Strength:    Strength is V/V in the upper and lower limbs.      Sensation: intact to LT, pin prick and vibration     Reflex Exam:  DTR's: trace AJs an patellars, 1=2+ uppers    Toes:    The toes are downgoing bilaterally.   Clonus:    Clonus is absent.    Assessment/Plan:  61 year old with neuropathy in the feet likely diabetic but we discussed in depth other causes, prevention. Likely small fiber.  Neuropathy: likely diabetic   [facility] [phone] because central obesity is a cause for neuropathy and worsens diabetes and EVEN pre-diabetes can cause neuropathy  -May consider daily alpha lipoic acid which is an antioxidant that may reduce free radical oxidative stress associated with diabetic polyneuropathy, existing evidence suggests that alpha lipoic acid significantly reduces stabbing, lancinating and burning pain and diabetic neuropathy with its onset of action as early as 1-2 weeks. 690m 1-2x a day  - best thing to do is decrease hgba1c and los weight  - follow up with Dr. BElease Hashimoto - CTS splinting, conservative, CTS: conservative measures, bracing at bedtime, if worse contact [place]for emg/ncs.  Provided: -https://[email or web address]_[email or web address]_[email or web address]_sensory_neuropathy.html#:~:text=Small%229fer%20sensory%20neuropathy%20(SFSN,called%20a%20length%2Ddependent%20SFSN. Orders Placed This Encounter  Procedures   B12 and Folate Panel   Methylmalonic acid, serum   TSH   No orders of the defined types were placed in this encounter.   Cc: DuPhylliss BobMD,  Burchette, BrAlinda SierrasMD  AnSarina IllMD  [facility] [address]uBettendorfrHendersonville[place] [address]Phone [phone]

## 2021-08-22 LAB — B12 AND FOLATE PANEL
Folate: >20 ng/mL (ref >3.0)
Vitamin B-12: 478 pg/mL (ref 232–1245)

## 2021-08-22 LAB — TSH: TSH: 2.7 u[IU]/mL (ref 0.450–4.500)

## 2021-08-22 LAB — METHYLMALONIC ACID, SERUM: Methylmalonic Acid: 134 nmol/L (ref 0–378)

## 2021-08-23 ENCOUNTER — Other Ambulatory Visit: Payer: Self-pay | Admitting: Internal Medicine

## 2021-08-27 ENCOUNTER — Encounter: Payer: Self-pay | Admitting: Family Medicine

## 2021-08-27 ENCOUNTER — Ambulatory Visit (INDEPENDENT_AMBULATORY_CARE_PROVIDER_SITE_OTHER): Payer: BC Managed Care – PPO | Admitting: Family Medicine

## 2021-08-27 VITALS — BP 124/62 | HR 65 | Temp 97.4°F | Ht 74.0 in | Wt 266.7 lb

## 2021-08-27 DIAGNOSIS — Z125 Encounter for screening for malignant neoplasm of prostate: Secondary | ICD-10-CM

## 2021-08-27 DIAGNOSIS — Z0000 Encounter for general adult medical examination without abnormal findings: Secondary | ICD-10-CM | POA: Diagnosis not present

## 2021-08-27 DIAGNOSIS — E119 Type 2 diabetes mellitus without complications: Secondary | ICD-10-CM

## 2021-08-27 LAB — CBC WITH DIFFERENTIAL/PLATELET
Basophils Absolute: 0 10*3/uL (ref 0.0–0.1)
Basophils Relative: 0.4 % (ref 0.0–3.0)
Eosinophils Absolute: 0.2 10*3/uL (ref 0.0–0.7)
Eosinophils Relative: 5.3 % — ABNORMAL HIGH (ref 0.0–5.0)
HCT: 48.5 % (ref 39.0–52.0)
Hemoglobin: 16.2 g/dL (ref 13.0–17.0)
Lymphocytes Relative: 34.7 % (ref 12.0–46.0)
Lymphs Abs: 1.6 10*3/uL (ref 0.7–4.0)
MCHC: 33.3 g/dL (ref 30.0–36.0)
MCV: 89.8 fl (ref 78.0–100.0)
Monocytes Absolute: 0.3 10*3/uL (ref 0.1–1.0)
Monocytes Relative: 7.1 % (ref 3.0–12.0)
Neutro Abs: 2.5 10*3/uL (ref 1.4–7.7)
Neutrophils Relative %: 52.5 % (ref 43.0–77.0)
Platelets: 224 10*3/uL (ref 150.0–400.0)
RBC: 5.41 Mil/uL (ref 4.22–5.81)
RDW: 12.7 % (ref 11.5–15.5)
WBC: 4.7 10*3/uL (ref 4.0–10.5)

## 2021-08-27 LAB — HEMOGLOBIN A1C: Hgb A1c MFr Bld: 6.6 % — ABNORMAL HIGH (ref 4.6–6.5)

## 2021-08-27 LAB — LDL CHOLESTEROL, DIRECT: Direct LDL: 63 mg/dL

## 2021-08-27 LAB — BASIC METABOLIC PANEL
BUN: 16 mg/dL (ref 6–23)
CO2: 30 mEq/L (ref 19–32)
Calcium: 9.3 mg/dL (ref 8.4–10.5)
Chloride: 104 mEq/L (ref 96–112)
Creatinine, Ser: 0.66 mg/dL (ref 0.40–1.50)
GFR: 101.83 mL/min (ref >60.00)
Glucose, Bld: 115 mg/dL — ABNORMAL HIGH (ref 70–99)
Potassium: 4.8 mEq/L (ref 3.5–5.1)
Sodium: 141 mEq/L (ref 135–145)

## 2021-08-27 LAB — HEPATIC FUNCTION PANEL
ALT: 16 U/L (ref 0–53)
AST: 25 U/L (ref 0–37)
Albumin: 4.5 g/dL (ref 3.5–5.2)
Alkaline Phosphatase: 56 U/L (ref 39–117)
Bilirubin, Direct: 0.1 mg/dL (ref 0.0–0.3)
Total Bilirubin: 0.8 mg/dL (ref 0.2–1.2)
Total Protein: 6.9 g/dL (ref 6.0–8.3)

## 2021-08-27 LAB — MICROALBUMIN / CREATININE URINE RATIO
Creatinine,U: 59.5 mg/dL
Microalb Creat Ratio: 1.2 mg/g (ref 0.0–30.0)
Microalb, Ur: <0.7 mg/dL (ref 0.0–1.9)

## 2021-08-27 LAB — LIPID PANEL
Cholesterol: 122 mg/dL (ref 0–200)
HDL: 39.1 mg/dL (ref >39.00)
NonHDL: 83.3
Total CHOL/HDL Ratio: 3
Triglycerides: 212 mg/dL — ABNORMAL HIGH (ref 0.0–149.0)
VLDL: 42.4 mg/dL — ABNORMAL HIGH (ref 0.0–40.0)

## 2021-08-27 LAB — PSA: PSA: 0.91 ng/mL (ref 0.10–4.00)

## 2021-08-27 MED ORDER — CELECOXIB 200 MG PO CAPS: 200.0000 mg | ORAL_CAPSULE | Freq: Two times a day (BID) | ORAL | 1 refills | Status: DC

## 2021-08-27 MED ORDER — CYCLOBENZAPRINE HCL 10 MG PO TABS: ORAL_TABLET | ORAL | 1 refills | Status: DC

## 2021-08-27 MED ORDER — METFORMIN HCL ER 500 MG PO TB24: 1000.0000 mg | ORAL_TABLET | Freq: Two times a day (BID) | ORAL | 1 refills | Status: DC

## 2021-08-27 MED ORDER — QUVIVIQ 50 MG PO TABS: 50.0000 mg | ORAL_TABLET | Freq: Every day | ORAL | 1 refills | Status: DC

## 2021-08-27 MED ORDER — EMPAGLIFLOZIN 10 MG PO TABS: 10.0000 mg | ORAL_TABLET | Freq: Every day | ORAL | 1 refills | Status: DC

## 2021-08-27 NOTE — Progress Notes (Signed)
Established Patient Office Visit  Subjective:  Patient ID: [name], male    DOB: 1961-01-14  Age: 61 y.o. MRN: [id number]  CC:  Chief Complaint  Patient presents with   Annual Exam    HPI [name] presents for complete physical.  Other problems include history of type 2 diabetes, osteoarthritis, history of kidney stones, obesity, hyperlipidemia, history of primary pancreatic neuroendocrine tumor.  Generally doing well.  He does have some mild neuropathy involving his distal toes especially second toe.  Has seen neurologist for this.  He had recent labs which were unremarkable.  His diabetes has been controlled recently with most recent A1c 6.1%.  Remains on metformin and Jardiance.  Health maintenance reviewed  -Flu vaccine already given. -Tetanus up-to-date -Colonoscopy due 2028 -Previous Shingrix vaccine given -No recent PSA.  Social history-currently working 3 days/week.  He is married and has 1 daughter.  Non-smoker.  Family history-mother's health is basically unknown.  He does not stay in touch with her.  Father died of stroke complications a 59.  He had heart issues in his late 74s and early 40s.  Wt Readings from Last 3 Encounters:  08/27/21 266 lb 11.2 oz (121 kg)  08/20/21 268 lb 3.2 oz (121.7 kg)  06/11/21 255 lb (115.7 kg)     Past Medical History:  Diagnosis Date   Arthritis    BURSITIS, LEFT HIP 05/20/2010   Claustrophobia    "take Lorazepam if I have to fly"   DIAB W/O COMP TYPE II/UNS NOT STATED UNCNTRL 03/30/2009   ELEVATED BLOOD PRESSURE 03/30/2009   Fissure, anal    occ bleeds still   GERD (gastroesophageal reflux disease)    Headache(784.0)    "only when I was working; had headaches and migraines"   History of hiatal hernia    History of kidney stones    has had 17 stones, still has one   Hyperlipidemia 10/11/2010   Insomnia    Irregular heart beat    Numbness and tingling of both feet    Pancreatic carcinoma ([place]) 2010   SHOULDER  PAIN, LEFT 10/20/2008   Skin cancer     Past Surgical History:  Procedure Laterality Date   ANAL FISSURE REPAIR  07/28/2011   Procedure: ANAL FISSURE REPAIR;  Surgeon: Adin Hector, MD;  [facility];  Service: General;  Laterality: N/A;  Repair Anal Fissure/sphinterotomy and excision rectal polypl   BACK SURGERY  2000   "had 2 cages put in"   BIOPSY PANCREAS  2010   benign   CYSTOSCOPY/URETEROSCOPY/HOLMIUM LASER/STENT PLACEMENT Left 07/04/2020   Procedure: CYSTOSCOPY LEFT RETROGRADE URETEROSCOPY/HOLMIUM LASER/STENT PLACEMENT;  Surgeon: Ardis Hughs, MD;  [facility];  Service: Urology;  Laterality: Left;   CYSTOSCOPY/URETEROSCOPY/HOLMIUM LASER/STENT PLACEMENT Bilateral 07/18/2020   Procedure: BILATERAL URETEROSCOPY BILATERAL RETROGRADE PYELOGRAM HOLMIUM LASER/STENT PLACEMENT;  Surgeon: Ardis Hughs, MD;  [facility];  Service: Urology;  Laterality: Bilateral;   HERNIA REPAIR  2774   umbilical   KIDNEY STONES  2010 & 2008 & 2005   LAPAROSCOPIC INCISIONAL / UMBILICAL / VENTRAL HERNIA REPAIR  07/08/11   VHR   left rotator cuff  02/26/2011   left shoulder bone spurs  2011   LIPOMA EXCISION     "several; off back, arms"   pancreatic tumor  2010   RADIOLOGY WITH ANESTHESIA N/A 06/11/2021   Procedure: MRI LUMBAR SPINE WITHOUT CONTRAST WITH ANESTHESIA;  Surgeon: Radiologist, Medication, MD;  Location: [facility];  Service: Radiology;  Laterality: N/A;   right rotator cuff     2011   TONSILLECTOMY  1967   TOTAL KNEE ARTHROPLASTY Left 04/28/2017   Procedure: LEFT TOTAL KNEE ARTHROPLASTY;  Surgeon: Paralee Cancel, MD;  [facility];  Service: Orthopedics;  Laterality: Left;   VASECTOMY  1994   VENTRAL HERNIA REPAIR  07/08/2011   Procedure: LAPAROSCOPIC VENTRAL HERNIA;  Surgeon: Adin Hector, MD;  Location: [facility];  Service: General;  Laterality: N/A;  Laparoscopic repair ventral hernia with mesh, Lysis of adhesions.    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 [employer].  (Makes biscuits) Retired.  Wife. 1 kid.    Social Determinants of Health   Financial Resource Strain: Not on file  Food Insecurity: Not on file  Transportation Needs: Not on file  Physical Activity: Not on file  Stress: Not on file  Social Connections: Not on file  Intimate Partner Violence: Not on file    Outpatient Medications Prior to Visit  Medication Sig Dispense Refill   amoxicillin (AMOXIL) 500 MG capsule TAKE FOUR CAPSULES BY MOUTH ONE HOUR BEFORE DENTAL CLEANING 12 capsule 0   aspirin EC 81 MG tablet Take 81 mg by mouth daily with supper. Swallow whole.     blood glucose meter kit and supplies KIT Use as directed 1 each 0   diphenhydrAMINE (BENADRYL) 25 MG tablet Take 75 mg by mouth every 6 (six) hours as needed for allergies.      glucose blood (CONTOUR TEST) test strip Use test strips twice daily as needed to check blood sugars 100 each 12   Lancet Device MISC Contour next test lancets.  Test once daily.  Dx E11.9 100 each 3   LORazepam (ATIVAN) 1 MG tablet TAKE ONE TABLET BY MOUTH TWICE A DAY AS NEEDED FOR ANXIETY 30 tablet 0   Multiple Vitamins-Minerals (CENTRUM PO) Take 1  tablet by mouth daily.     rosuvastatin (CRESTOR) 20 MG tablet Take 1 tablet (20 mg total) by mouth daily. KEEP APPT WITH PROVIDER FOR REFILLS 30 tablet 0   celecoxib (CELEBREX) 200 MG capsule TAKE ONE CAPSULE BY MOUTH TWICE A DAY 180 capsule 0   cyclobenzaprine (FLEXERIL) 10 MG tablet TAKE ONE TABLET BY MOUTH EVERY 8 HOURS AS NEEDED FOR MUSCLE SPASMS 60 tablet 0   Daridorexant HCl (QUVIVIQ) 50 MG TABS Take 50 mg by mouth at bedtime. 30 tablet 2   JARDIANCE 10 MG TABS tablet TAKE ONE TABLET BY MOUTH DAILY 90 tablet  3   metFORMIN (GLUCOPHAGE-XR) 500 MG 24 hr tablet TAKE TWO TABLETS BY MOUTH TWICE A DAY 360 tablet 1   No facility-administered medications prior to visit.    Allergies  Allergen Reactions   Merthiolate [Thimerosol]     Unknown reaction   Morphine     REACTION: paronia   Nickel Dermatitis   Pravachol [Pravastatin]     HIGH CPK    Trazodone     Makes jittery    ROS Review of Systems  Constitutional:  Negative for activity change, appetite change, fatigue and fever.  HENT:  Negative for congestion, ear pain and trouble swallowing.   Eyes:  Negative for pain and visual disturbance.  Respiratory:  Negative for cough, shortness of breath and wheezing.   Cardiovascular:  Negative for chest pain and palpitations.  Gastrointestinal:  Negative for abdominal distention, abdominal pain, blood in stool, constipation, diarrhea, nausea, rectal pain and vomiting.  Endocrine: Negative for polydipsia and polyuria.  Genitourinary:  Negative for dysuria, hematuria and testicular pain.  Musculoskeletal:  Negative for arthralgias and joint swelling.  Skin:  Negative for rash.  Neurological:  Negative for dizziness, syncope and headaches.  Hematological:  Negative for adenopathy.  Psychiatric/Behavioral:  Negative for confusion and dysphoric mood.      Objective:    Physical Exam Constitutional:      General: He is not in acute distress.    Appearance: He is well-developed.  HENT:      Head: Normocephalic and atraumatic.     Right Ear: External ear normal.     Left Ear: External ear normal.  Eyes:     Conjunctiva/sclera: Conjunctivae normal.     Pupils: Pupils are equal, round, and reactive to light.  Neck:     Thyroid: No thyromegaly.  Cardiovascular:     Rate and Rhythm: Normal rate and regular rhythm.     Heart sounds: Normal heart sounds. No murmur heard. Pulmonary:     Effort: No respiratory distress.     Breath sounds: No wheezing or rales.  Abdominal:     General: Bowel sounds are normal. There is no distension.     Palpations: Abdomen is soft. There is no mass.     Tenderness: There is no abdominal tenderness. There is no guarding or rebound.  Musculoskeletal:     Cervical back: Normal range of motion and neck supple.     Right lower leg: No edema.     Left lower leg: No edema.  Lymphadenopathy:     Cervical: No cervical adenopathy.  Skin:    Findings: No rash.     Comments: Feet reveal no lesions.  Good distal pulses.  Good capillary refill.  Mild sensory impairment to touch distal toes bilaterally  Neurological:     Mental Status: He is alert and oriented to person, place, and time.     Cranial Nerves: No cranial nerve deficit.     Deep Tendon Reflexes: Reflexes normal.    BP 124/62 (BP Location: Left Arm, Patient Position: Sitting, Cuff Size: Normal)    Pulse 65    Temp (!) 97.4 F (36.3 C) (Oral)    Ht 6' 2"  (1.88 m)    Wt 266 lb 11.2 oz (121 kg)    SpO2 99%    BMI 34.24 kg/m  Wt Readings from Last 3 Encounters:  08/27/21 266 lb 11.2 oz (121 kg)  08/20/21 268 lb 3.2 oz (121.7 kg)  06/11/21 255 lb (115.7 kg)  Health Maintenance Due  Topic Date Due   FOOT EXAM  05/06/2014   URINE MICROALBUMIN  03/23/2018   COVID-19 Vaccine (4 - Booster for Pfizer series) 06/30/2020   HEMOGLOBIN A1C  07/20/2021    There are no preventive care reminders to display for this patient.  Lab Results  Component Value Date   TSH 2.700 08/20/2021    Lab Results  Component Value Date   WBC 6.0 07/18/2020   HGB 14.1 07/18/2020   HCT 42.5 07/18/2020   MCV 91.0 07/18/2020   PLT 258 07/18/2020   Lab Results  Component Value Date   NA 138 06/11/2021   K 3.8 06/11/2021   CO2 23 06/11/2021   GLUCOSE 125 (H) 06/11/2021   BUN 14 06/11/2021   CREATININE 0.54 (L) 06/11/2021   BILITOT 0.9 07/24/2020   ALKPHOS 45 07/06/2020   AST 10 07/24/2020   ALT 3 (L) 07/24/2020   PROT 6.5 07/24/2020   ALBUMIN 4.0 07/06/2020   CALCIUM 8.6 (L) 06/11/2021   ANIONGAP 9 06/11/2021   GFR 93.65 08/08/2019   Lab Results  Component Value Date   CHOL 144 07/24/2020   Lab Results  Component Value Date   HDL 43 07/24/2020   Lab Results  Component Value Date   LDLCALC 64 07/24/2020   Lab Results  Component Value Date   TRIG 304 (H) 07/24/2020   Lab Results  Component Value Date   CHOLHDL 3.3 07/24/2020   Lab Results  Component Value Date   HGBA1C 6.1 (A) 01/18/2021      Assessment & Plan:   Problem List Items Addressed This Visit       Unprioritized   Controlled type 2 diabetes mellitus without complication (HCC)   Relevant Medications   empagliflozin (JARDIANCE) 10 MG TABS tablet   metFORMIN (GLUCOPHAGE-XR) 500 MG 24 hr tablet   Other Relevant Orders   Microalbumin / creatinine urine ratio   Hemoglobin A1c   Other Visit Diagnoses     Physical exam    -  Primary   Relevant Orders   Basic metabolic panel   Lipid panel   CBC with Differential/Platelet   Hepatic function panel   PSA      -Continue annual flu vaccine -Check urine microalbumin and A1c in addition to labs above -Establish more consistent exercise habits -We gave handout on coronary calcium scan to consider for further restratification for CAD given his family history -Recommend yearly diabetic eye exam -Consider 28-monthfollow-up and sooner if indicated by labs  Meds ordered this encounter  Medications   celecoxib (CELEBREX) 200 MG capsule    Sig:  Take 1 capsule (200 mg total) by mouth 2 (two) times daily.    Dispense:  180 capsule    Refill:  1   cyclobenzaprine (FLEXERIL) 10 MG tablet    Sig: Take 1 tablet by mouth every 8 hours as needed for muscle spasms.    Dispense:  270 tablet    Refill:  1   Daridorexant HCl (QUVIVIQ) 50 MG TABS    Sig: Take 50 mg by mouth at bedtime.    Dispense:  90 tablet    Refill:  1   empagliflozin (JARDIANCE) 10 MG TABS tablet    Sig: Take 1 tablet (10 mg total) by mouth daily.    Dispense:  90 tablet    Refill:  1   metFORMIN (GLUCOPHAGE-XR) 500 MG 24 hr tablet    Sig: Take 2 tablets (1,000 mg total) by mouth  2 (two) times daily.    Dispense:  360 tablet    Refill:  1    Follow-up: No follow-ups on file.    Carolann Littler, MD

## 2021-08-27 NOTE — Patient Instructions (Signed)
Health Maintenance  Topic Date Due   FOOT EXAM  05/06/2014   URINE MICROALBUMIN  03/23/2018   COVID-19 Vaccine (4 - Booster for Pfizer series) 06/30/2020   HEMOGLOBIN A1C  07/20/2021   OPHTHALMOLOGY EXAM  10/09/2021   COLONOSCOPY (Pts 45-23yrs Insurance coverage will need to be confirmed)  09/29/2026   TETANUS/TDAP  03/24/2027   INFLUENZA VACCINE  Completed   Hepatitis C Screening  Completed   HIV Screening  Completed   Zoster Vaccines- Shingrix  Completed   HPV VACCINES  Aged Out

## 2021-09-02 IMAGING — US US BREAST*L* LIMITED INC AXILLA
1 series · 8 of 8 positions shown · non-contrast
Comparison: Previous exam(s).

ACR Breast Density Category a: The breast tissue is almost entirely
fatty.

CLINICAL DATA: 59-year-old male presenting with a lump in the left
No family history of breast cancer.

EXAM:
DIGITAL DIAGNOSTIC BILATERAL MAMMOGRAM WITH TOMOSYNTHESIS AND CAD;
ULTRASOUND LEFT BREAST LIMITED
TECHNIQUE: Bilateral digital diagnostic mammography and breast tomosynthesis
was performed. The images were evaluated with computer-aided
detection.; Targeted ultrasound examination of the left breast was
performed

[Series 1: us breast*left* limited inc axilla · 0.06mm/px · 8 of 8 slices shown]
[im 1/8]
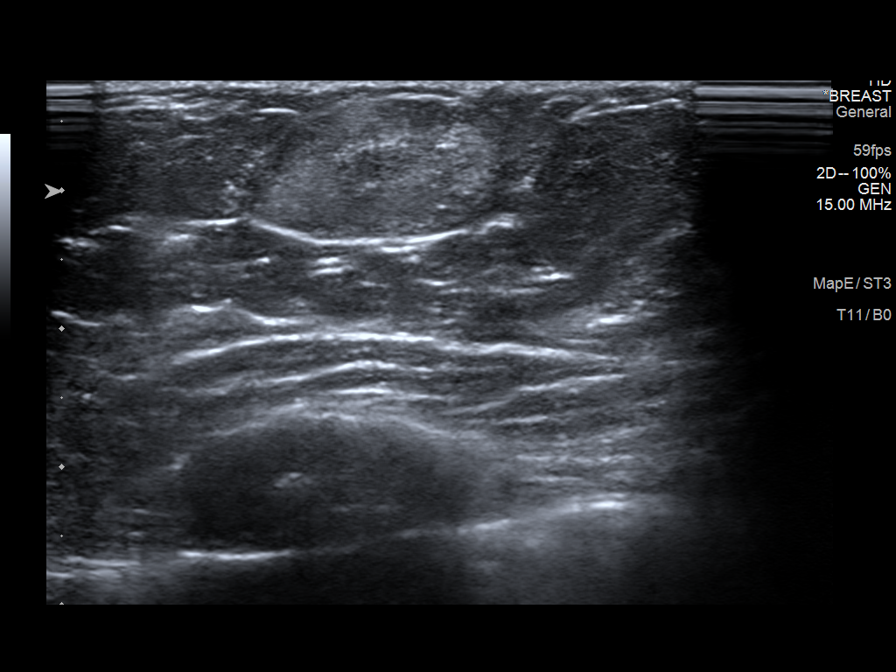
[im 2/8]
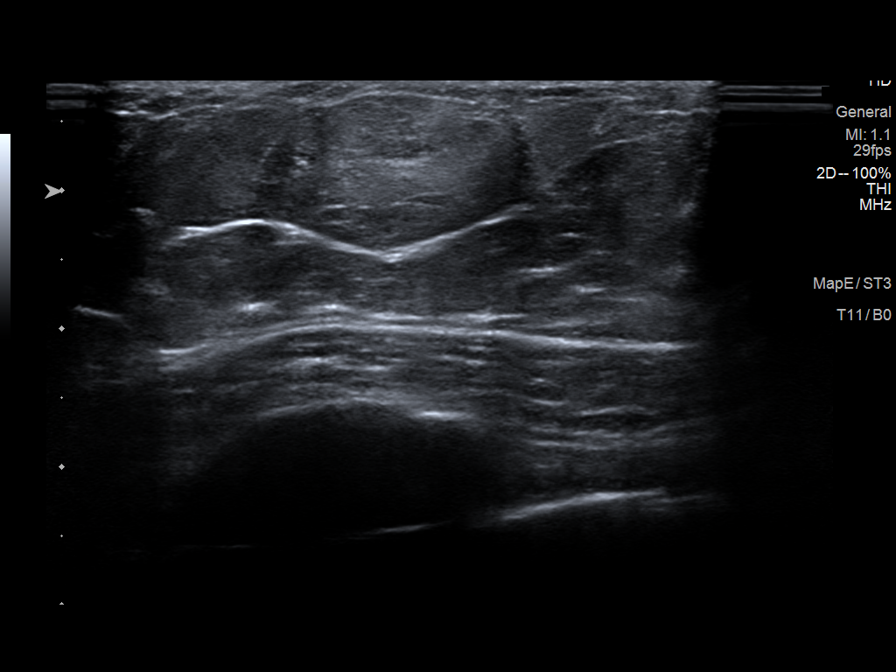
[im 3/8]
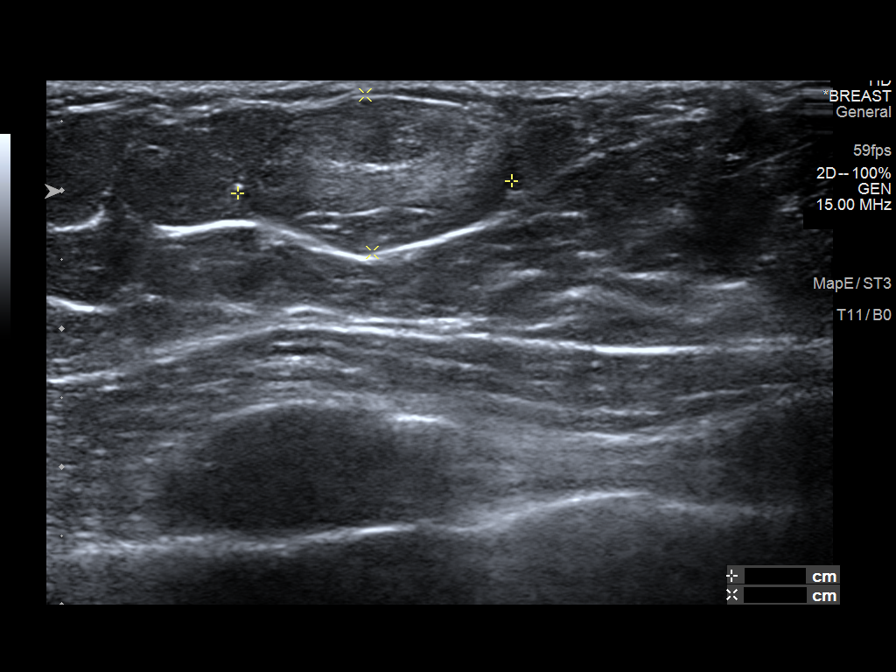
[im 4/8]
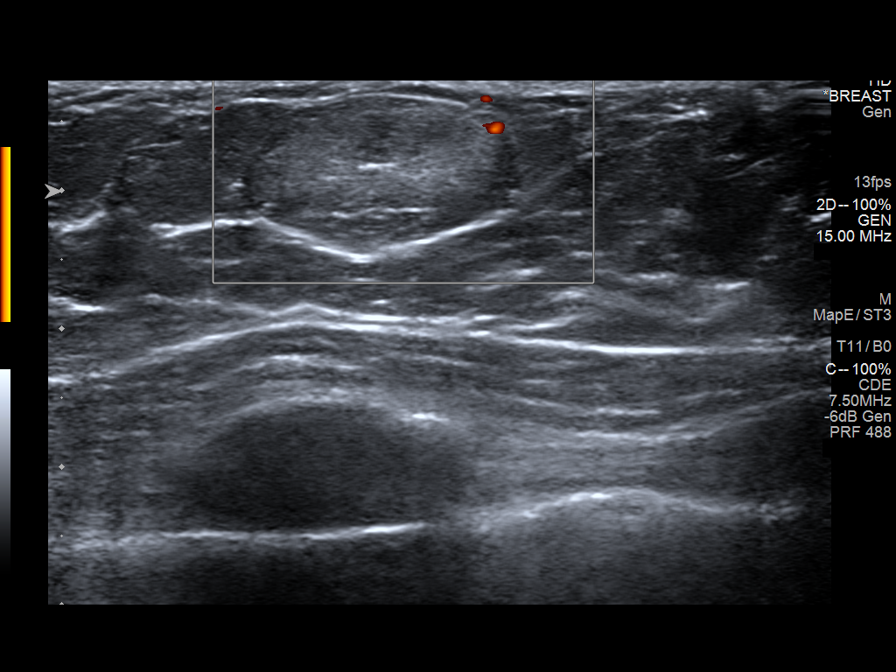
[im 5/8]
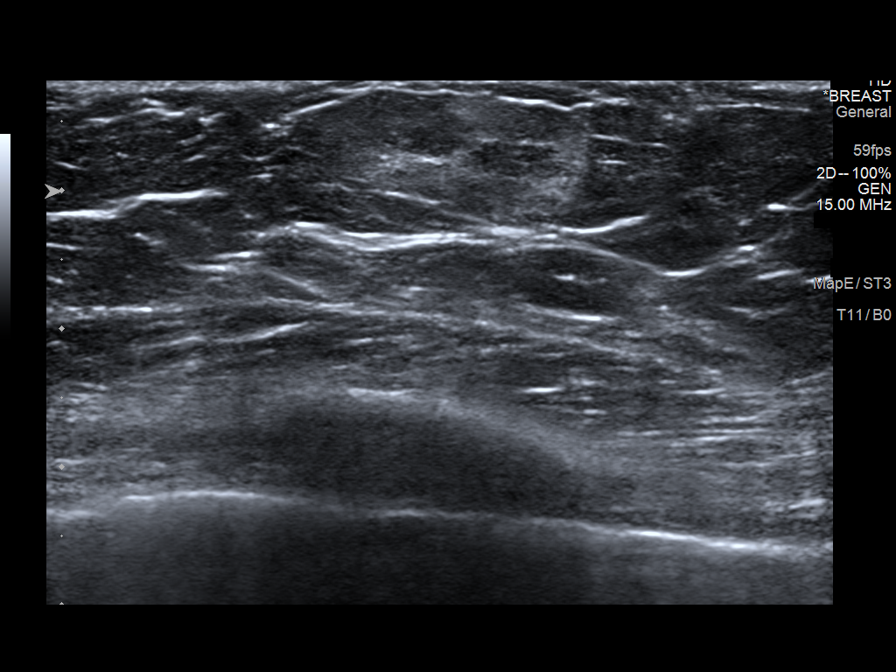
[im 6/8]
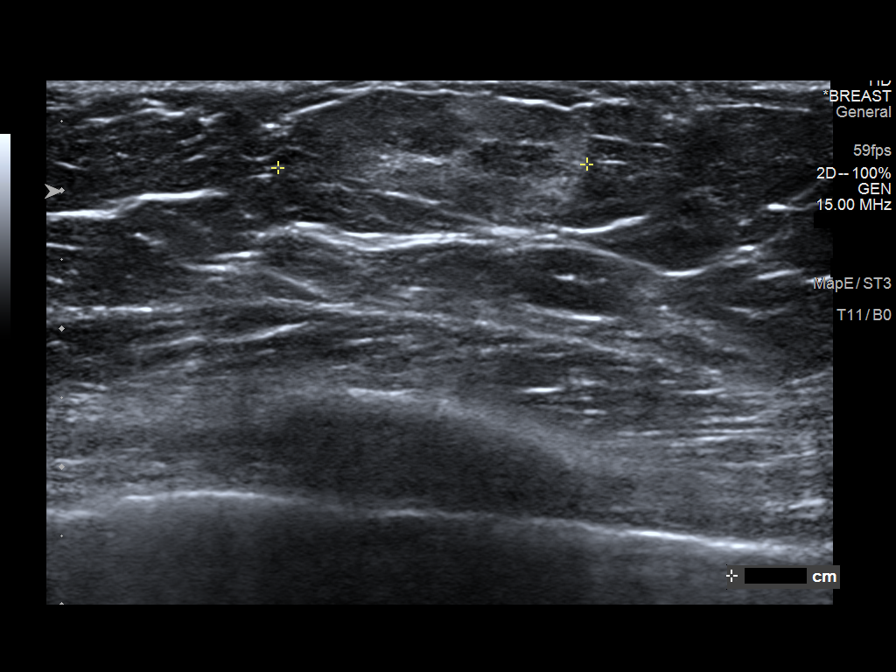
[im 7/8]
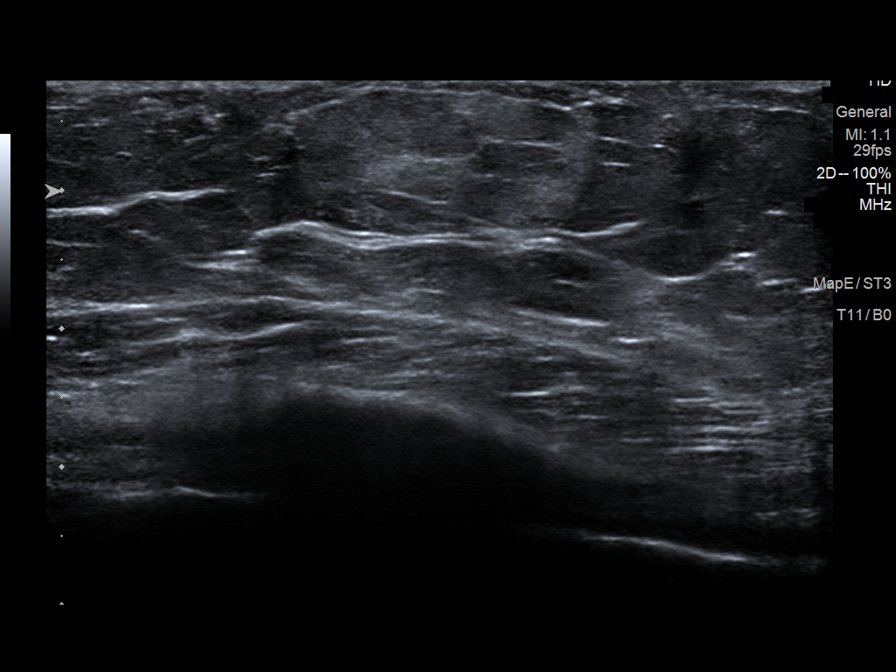
[im 8/8]
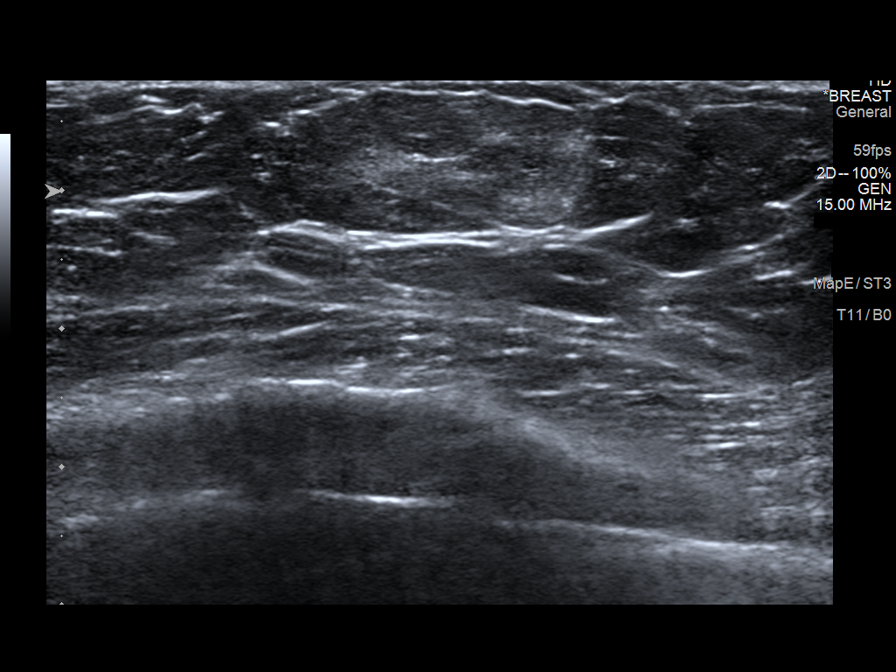

[8 of 8 positions shown; findings below may reference images not displayed]

FINDINGS: Mammogram:

Right breast: No suspicious mass, distortion, or microcalcifications
are identified to suggest presence of malignancy.

Left breast: A skin BB marks the palpable site of concern reported
by the patient in the lower inner left breast. A spot tangential
view of this area was performed. There is only fat density tissue.
No suspicious mass, distortion, or microcalcifications are
identified to suggest presence of malignancy.

On physical exam at the site of concern reported by the patient I
feel a smooth mobile mass.

Ultrasound:

Targeted ultrasound is performed at the site of concern reported by
the patient in the left breast at 8:30 o'clock 6 cm from the nipple
demonstrating an oval circumscribed isoechoic mass measuring 2.0 x
1.1 x 2.2 cm, most consistent with a lipoma. No internal
vascularity.
IMPRESSION: At the palpable site of concern in the left breast at 8:30 o'clock
there is a fat containing mass most consistent with a lipoma. No
mammographic or sonographic evidence of malignancy.

RECOMMENDATION:
Clinical follow-up as needed for the left breast.

I have discussed the findings and recommendations with the patient.
If applicable, a reminder letter will be sent to the patient
regarding the next appointment.

BI-RADS CATEGORY  2: Benign.

## 2021-09-06 ENCOUNTER — Other Ambulatory Visit: Payer: Self-pay | Admitting: Internal Medicine

## 2021-09-18 ENCOUNTER — Encounter: Payer: Self-pay | Admitting: Family Medicine

## 2021-09-19 MED ORDER — POTASSIUM CITRATE ER 10 MEQ (1080 MG) PO TBCR: 10.0000 meq | EXTENDED_RELEASE_TABLET | Freq: Two times a day (BID) | ORAL | 3 refills | Status: DC

## 2021-09-19 NOTE — Telephone Encounter (Signed)
Please advise. Rx is not on the current med list 

## 2021-10-04 ENCOUNTER — Other Ambulatory Visit: Payer: Self-pay

## 2021-10-04 MED ORDER — ROSUVASTATIN CALCIUM 20 MG PO TABS: 20.0000 mg | ORAL_TABLET | Freq: Every day | ORAL | 3 refills | Status: DC

## 2021-10-15 ENCOUNTER — Other Ambulatory Visit: Payer: Self-pay | Admitting: Family Medicine

## 2022-01-28 ENCOUNTER — Other Ambulatory Visit: Payer: Self-pay | Admitting: Family Medicine

## 2022-01-31 ENCOUNTER — Other Ambulatory Visit: Payer: Self-pay | Admitting: Family Medicine

## 2022-03-05 ENCOUNTER — Other Ambulatory Visit: Payer: Self-pay | Admitting: Family Medicine

## 2022-03-05 NOTE — Telephone Encounter (Signed)
Last refill-08/27/21--90 tabs, 1 refill Last OV physical -08/27/21  Next OV- 03/07/22

## 2022-03-07 ENCOUNTER — Encounter: Payer: Self-pay | Admitting: Family Medicine

## 2022-03-07 ENCOUNTER — Ambulatory Visit (INDEPENDENT_AMBULATORY_CARE_PROVIDER_SITE_OTHER): Payer: BC Managed Care – PPO | Admitting: Family Medicine

## 2022-03-07 VITALS — BP 122/80 | HR 82 | Temp 98.1°F | Wt 265.2 lb

## 2022-03-07 DIAGNOSIS — E785 Hyperlipidemia, unspecified: Secondary | ICD-10-CM | POA: Diagnosis not present

## 2022-03-07 DIAGNOSIS — K219 Gastro-esophageal reflux disease without esophagitis: Secondary | ICD-10-CM

## 2022-03-07 DIAGNOSIS — M159 Polyosteoarthritis, unspecified: Secondary | ICD-10-CM | POA: Diagnosis not present

## 2022-03-07 DIAGNOSIS — E119 Type 2 diabetes mellitus without complications: Secondary | ICD-10-CM

## 2022-03-07 LAB — POCT GLYCOSYLATED HEMOGLOBIN (HGB A1C): Hemoglobin A1C: 6.6 % — AB (ref 4.0–5.6)

## 2022-03-07 MED ORDER — LORAZEPAM 1 MG PO TABS: ORAL_TABLET | ORAL | 0 refills | Status: DC

## 2022-03-07 MED ORDER — PANTOPRAZOLE SODIUM 40 MG PO TBEC: 40.0000 mg | DELAYED_RELEASE_TABLET | Freq: Every day | ORAL | 3 refills | Status: DC

## 2022-03-07 MED ORDER — GABAPENTIN 100 MG PO CAPS: 100.0000 mg | ORAL_CAPSULE | Freq: Three times a day (TID) | ORAL | 0 refills | Status: DC

## 2022-03-07 MED ORDER — CYCLOBENZAPRINE HCL 10 MG PO TABS: ORAL_TABLET | ORAL | 1 refills | Status: DC

## 2022-03-07 MED ORDER — ROSUVASTATIN CALCIUM 20 MG PO TABS: 20.0000 mg | ORAL_TABLET | Freq: Every day | ORAL | 3 refills | Status: DC

## 2022-03-07 MED ORDER — CELECOXIB 200 MG PO CAPS
200.0000 mg | ORAL_CAPSULE | Freq: Two times a day (BID) | ORAL | 1 refills | Status: DC
Start: 2022-03-07 — End: 2022-08-26

## 2022-03-07 MED ORDER — METFORMIN HCL ER 500 MG PO TB24: 1000.0000 mg | ORAL_TABLET | Freq: Two times a day (BID) | ORAL | 1 refills | Status: DC

## 2022-03-07 MED ORDER — EMPAGLIFLOZIN 10 MG PO TABS: 10.0000 mg | ORAL_TABLET | Freq: Every day | ORAL | 1 refills | Status: DC

## 2022-03-07 NOTE — Progress Notes (Signed)
Established Patient Office Visit  Subjective   Patient ID: [name], male    DOB: [birth year]  Age: 61 y.o. MRN: [id number]  Chief Complaint  Patient presents with   Medication Consultation   Back Pain    Patient complains of back pain, x3 weeks, Patient reports he is moving and lifting heavy objects     HPI   Here for medical follow-up.  He has history of type 2 diabetes, osteoarthritis multiple sites with prior left total knee replacement, chronic insomnia, GERD  Currently in process of building a new home and will be hopefully moving soon from their apartment to the new home.  He had stress regarding that process.  Eating more than usual.  Has gained about 10 pounds.  Does walk some for exercise.  Has had some recent right lower back pain.  Occasional radiculitis symptoms.  Denies any lower extremity numbness or weakness.  Still able to walk without too much difficulty.  He had MRI scan several months ago that showed only minimal degenerative changes.  No significant foraminal narrowing or nerve impingement.  Requesting refill of several medications today including Celebrex, Flexeril, Quiviviq, Jardiance, gabapentin, metformin, Protonix, and Crestor.  He had full set of labs in January.  A1c at that time was 6.6%.  Past Medical History:  Diagnosis Date   Arthritis    BURSITIS, LEFT HIP 05/20/2010   Claustrophobia    "take Lorazepam if I have to fly"   DIAB W/O COMP TYPE II/UNS NOT STATED UNCNTRL 03/30/2009   ELEVATED BLOOD PRESSURE 03/30/2009   Fissure, anal    occ bleeds still   GERD (gastroesophageal reflux disease)    Headache(784.0)    "only when I was working; had headaches and migraines"   History of hiatal hernia    History of kidney stones    has had 17 stones, still has one   Hyperlipidemia 10/11/2010   Insomnia    Irregular heart beat    Numbness and tingling of both feet    Pancreatic carcinoma ([place]) 2010   SHOULDER PAIN, LEFT 10/20/2008   Skin cancer     Past Surgical History:  Procedure Laterality Date   ANAL FISSURE REPAIR  07/28/2011   Procedure: ANAL FISSURE REPAIR;  Surgeon: Adin Hector, MD;  Location: [facility];  Service: General;  Laterality: N/A;  Repair Anal Fissure/sphinterotomy and excision rectal polypl   BACK SURGERY  2000   "had 2 cages put in"   BIOPSY PANCREAS  2010   benign   CYSTOSCOPY/URETEROSCOPY/HOLMIUM LASER/STENT PLACEMENT Left 07/04/2020   Procedure: CYSTOSCOPY LEFT RETROGRADE URETEROSCOPY/HOLMIUM LASER/STENT PLACEMENT;  Surgeon: Ardis Hughs, MD;  Location: [facility];  Service: Urology;  Laterality: Left;   CYSTOSCOPY/URETEROSCOPY/HOLMIUM LASER/STENT PLACEMENT Bilateral 07/18/2020   Procedure: BILATERAL URETEROSCOPY BILATERAL RETROGRADE PYELOGRAM HOLMIUM LASER/STENT PLACEMENT;  Surgeon: Ardis Hughs, MD;  Location: [facility];  Service: Urology;  Laterality: Bilateral;   HERNIA REPAIR  2876   umbilical   KIDNEY STONES  2010 & 2008 & 2005   LAPAROSCOPIC INCISIONAL / UMBILICAL / VENTRAL HERNIA REPAIR  07/08/11   VHR   left rotator cuff  02/26/2011   left shoulder bone spurs  2011   LIPOMA EXCISION     "several; off back, arms"   pancreatic tumor  2010   RADIOLOGY WITH ANESTHESIA N/A 06/11/2021   Procedure: MRI LUMBAR SPINE WITHOUT CONTRAST WITH ANESTHESIA;  Surgeon: Radiologist, Medication, MD;  Location: [facility];  Service: Radiology;  Laterality: N/A;  right rotator cuff     2011   TONSILLECTOMY  1967   TOTAL KNEE ARTHROPLASTY Left 04/28/2017   Procedure: LEFT TOTAL KNEE ARTHROPLASTY;  Surgeon: Paralee Cancel, MD;  Location: [facility];  Service: Orthopedics;  Laterality: Left;   VASECTOMY  1994   VENTRAL HERNIA REPAIR  07/08/2011   Procedure: LAPAROSCOPIC VENTRAL HERNIA;  Surgeon: Adin Hector, MD;  Location: [facility];  Service: General;  Laterality: N/A;  Laparoscopic repair ventral hernia with mesh, Lysis of adhesions.    reports that he has never smoked. He has never used smokeless tobacco. He reports  that he does not drink alcohol and does not use drugs. family history includes Cancer in his maternal grandfather; Colon cancer (age of onset: 55) in his maternal grandfather; Diabetes in his brother; Heart attack (age of onset: 13) in his father; Heart disease in his father; Stroke in his father. Allergies  Allergen Reactions   Merthiolate [Thimerosol]     Unknown reaction   Morphine     REACTION: paronia   Nickel Dermatitis   Pravachol [Pravastatin]     HIGH CPK    Trazodone     Makes jittery    Review of Systems  Constitutional:  Negative for malaise/fatigue.  Eyes:  Negative for blurred vision.  Respiratory:  Negative for shortness of breath.   Cardiovascular:  Negative for chest pain.  Gastrointestinal:  Negative for abdominal pain.  Genitourinary:  Negative for dysuria.  Neurological:  Negative for dizziness, weakness and headaches.      Objective:     BP 122/80 (BP Location: Left Arm, Patient Position: Sitting, Cuff Size: Normal)   Pulse 82   Temp 98.1 F (36.7 C) (Oral)   Wt 265 lb 3.2 oz (120.3 kg)   SpO2 97%   BMI 34.05 kg/m  BP Readings from Last 3 Encounters:  03/07/22 122/80  08/27/21 124/62  08/20/21 133/86   Wt Readings from Last 3 Encounters:  03/07/22 265 lb 3.2 oz (120.3 kg)  08/27/21 266 lb 11.2 oz (121 kg)  08/20/21 268 lb 3.2 oz (121.7 kg)      Physical Exam Constitutional:      Appearance: He is well-developed.  HENT:     Right Ear: External ear normal.     Left Ear: External ear normal.  Eyes:     Pupils: Pupils are equal, round, and reactive to light.  Neck:     Thyroid: No thyromegaly.  Cardiovascular:     Rate and Rhythm: Normal rate and regular rhythm.  Pulmonary:     Effort: Pulmonary effort is normal. No respiratory distress.     Breath sounds: Normal breath sounds. No wheezing or rales.  Musculoskeletal:     Cervical back: Neck supple.     Right lower leg: No edema.     Left lower leg: No edema.  Neurological:      Mental Status: He is alert and oriented to person, place, and time.      No results found for any visits on 03/07/22.    The ASCVD Risk score (Arnett DK, et al., 2019) failed to calculate for the following reasons:   The valid total cholesterol range is 130 to 320 mg/dL    Assessment & Plan:   #1 type 2 diabetes controlled with A1c today 6.6%.  We have challenged him to try to lose some weight.  Continue regular exercise with walking as tolerated.  Recheck in 6 months. Continue metformin and Jardiance  #2 osteoarthritis  involving multiple joints.  We did refill his Celebrex.  He is aware to watch out for GI side effects with this.  #3 hyperlipidemia.  Patient on Crestor 20 mg daily.  Lipids and hepatic panel were checked in January and stable.  Refill Crestor.  #4 history of GERD stable on Protonix.  Refill Protonix for 1 year.   Return in about 6 months (around 09/07/2022).    Carolann Littler, MD

## 2022-03-10 DIAGNOSIS — Z1283 Encounter for screening for malignant neoplasm of skin: Secondary | ICD-10-CM | POA: Diagnosis not present

## 2022-03-10 DIAGNOSIS — L821 Other seborrheic keratosis: Secondary | ICD-10-CM | POA: Diagnosis not present

## 2022-03-10 DIAGNOSIS — X32XXXD Exposure to sunlight, subsequent encounter: Secondary | ICD-10-CM | POA: Diagnosis not present

## 2022-03-10 DIAGNOSIS — L570 Actinic keratosis: Secondary | ICD-10-CM | POA: Diagnosis not present

## 2022-03-23 ENCOUNTER — Encounter: Payer: Self-pay | Admitting: Family Medicine

## 2022-03-24 MED ORDER — PREDNISONE 20 MG PO TABS: ORAL_TABLET | ORAL | 0 refills | Status: DC

## 2022-03-25 ENCOUNTER — Other Ambulatory Visit: Payer: Self-pay | Admitting: Family Medicine

## 2022-06-19 DIAGNOSIS — M170 Bilateral primary osteoarthritis of knee: Secondary | ICD-10-CM | POA: Diagnosis not present

## 2022-06-19 DIAGNOSIS — M1712 Unilateral primary osteoarthritis, left knee: Secondary | ICD-10-CM | POA: Diagnosis not present

## 2022-06-19 DIAGNOSIS — M1711 Unilateral primary osteoarthritis, right knee: Secondary | ICD-10-CM | POA: Diagnosis not present

## 2022-07-02 ENCOUNTER — Ambulatory Visit (INDEPENDENT_AMBULATORY_CARE_PROVIDER_SITE_OTHER): Payer: BC Managed Care – PPO | Admitting: Family Medicine

## 2022-07-02 ENCOUNTER — Encounter: Payer: Self-pay | Admitting: Family Medicine

## 2022-07-02 VITALS — BP 110/60 | HR 65 | Temp 97.7°F | Ht 74.0 in | Wt 256.4 lb

## 2022-07-02 DIAGNOSIS — E785 Hyperlipidemia, unspecified: Secondary | ICD-10-CM

## 2022-07-02 DIAGNOSIS — E119 Type 2 diabetes mellitus without complications: Secondary | ICD-10-CM | POA: Diagnosis not present

## 2022-07-02 DIAGNOSIS — Z01818 Encounter for other preprocedural examination: Secondary | ICD-10-CM

## 2022-07-02 NOTE — Progress Notes (Signed)
Established Patient Office Visit  Subjective   Patient ID: [name], male    DOB: Mar 28, 1961  Age: 61 y.o. MRN: [id number]  Chief Complaint  Patient presents with   Annual Exam    HPI   [name] is seen for presurgical consult.   He will be getting right total knee replacement.  Date for surgery has not yet been set up.  Surgical group is planning to get preoperative labs including A1c.  [name] has past history of GERD, controlled type 2 diabetes, osteoarthritis, hyperlipidemia, high coronary calcium score.  He had coronary calcium score in 2021 of 371 which is 89th percentile for age and gender.  He has had no recent chest pains.  He takes rosuvastatin 20 mg daily.  Last LDL cholesterol 63.  He had A1c back in August was 6.6 and has lost even further weight due to his efforts since then.  He is down about another 9 pounds.  He hopes to increase his walking after his surgery and lose additional weight.  Does not have any family history of premature CAD.  His father had a stroke and apparently had some kind of cardiac disease age 60.  [name] is a non-smoker.  No chronic lung disease.  Past Medical History:  Diagnosis Date   Arthritis    BURSITIS, LEFT HIP 05/20/2010   Claustrophobia    "take Lorazepam if I have to fly"   DIAB W/O COMP TYPE II/UNS NOT STATED UNCNTRL 03/30/2009   ELEVATED BLOOD PRESSURE 03/30/2009   Fissure, anal    occ bleeds still   GERD (gastroesophageal reflux disease)    Headache(784.0)    "only when I was working; had headaches and migraines"   History of hiatal hernia    History of kidney stones    has had 17 stones, still has one   Hyperlipidemia 10/11/2010   Insomnia    Irregular heart beat    Numbness and tingling of both feet    Pancreatic carcinoma ([place]) 2010   SHOULDER PAIN, LEFT 10/20/2008   Skin cancer    Past Surgical History:  Procedure Laterality Date   ANAL FISSURE REPAIR  07/28/2011   Procedure: ANAL FISSURE REPAIR;  Surgeon: Adin Hector, MD;  [facility];  Service: General;  Laterality: N/A;  Repair Anal Fissure/sphinterotomy and excision rectal polypl   BACK SURGERY  2000   "had 2 cages put in"   BIOPSY PANCREAS  2010   benign   CYSTOSCOPY/URETEROSCOPY/HOLMIUM LASER/STENT PLACEMENT Left 07/04/2020   Procedure: CYSTOSCOPY LEFT RETROGRADE URETEROSCOPY/HOLMIUM LASER/STENT PLACEMENT;  Surgeon: Ardis Hughs, MD;  [facility];  Service: Urology;  Laterality: Left;   CYSTOSCOPY/URETEROSCOPY/HOLMIUM LASER/STENT PLACEMENT Bilateral 07/18/2020   Procedure: BILATERAL URETEROSCOPY BILATERAL RETROGRADE PYELOGRAM HOLMIUM LASER/STENT PLACEMENT;  Surgeon: Ardis Hughs, MD;  [facility];  Service: Urology;  Laterality: Bilateral;   HERNIA REPAIR  8101   umbilical   KIDNEY STONES  2010 & 2008 & 2005   LAPAROSCOPIC INCISIONAL / UMBILICAL / VENTRAL HERNIA REPAIR  07/08/11   VHR   left rotator cuff  02/26/2011   left shoulder bone spurs  2011   LIPOMA EXCISION     "several; off back, arms"   pancreatic tumor  2010   RADIOLOGY WITH ANESTHESIA N/A 06/11/2021   Procedure: MRI LUMBAR SPINE WITHOUT CONTRAST WITH ANESTHESIA;  Surgeon: Radiologist, Medication, MD;  Location: [facility];  Service: Radiology;  Laterality: N/A;   right rotator cuff     2011  TONSILLECTOMY  1967   TOTAL KNEE ARTHROPLASTY Left 04/28/2017   Procedure: LEFT TOTAL KNEE ARTHROPLASTY;  Surgeon: Paralee Cancel, MD;  [facility];  Service: Orthopedics;  Laterality: Left;   VASECTOMY  1994   VENTRAL HERNIA REPAIR  07/08/2011   Procedure: LAPAROSCOPIC VENTRAL HERNIA;  Surgeon: Adin Hector, MD;  Location: [facility];  Service: General;  Laterality: N/A;  Laparoscopic repair ventral hernia with mesh, Lysis of adhesions.    reports that he has never smoked. He has never used smokeless tobacco. He reports that he does not drink alcohol and does not use drugs. family history includes Cancer in his maternal grandfather; Colon cancer (age of onset: 56)  in his maternal grandfather; Diabetes in his brother; Heart attack (age of onset: 51) in his father; Heart disease in his father; Stroke in his father. Allergies  Allergen Reactions   Merthiolate [Thimerosol]     Unknown reaction   Morphine     REACTION: paronia   Nickel Dermatitis   Pravachol [Pravastatin]     HIGH CPK    Trazodone     Makes jittery     Review of Systems  Constitutional:  Negative for chills, fever, malaise/fatigue and weight loss.  HENT:  Negative for hearing loss.   Eyes:  Negative for blurred vision and double vision.  Respiratory:  Negative for cough and shortness of breath.   Cardiovascular:  Negative for chest pain, palpitations and leg swelling.  Gastrointestinal:  Negative for abdominal pain, blood in stool, constipation and diarrhea.  Genitourinary:  Negative for dysuria.  Skin:  Negative for rash.  Neurological:  Negative for dizziness, speech change, seizures, loss of consciousness and headaches.  Psychiatric/Behavioral:  Negative for depression.       Objective:     BP 110/60 (BP Location: Left Arm, Patient Position: Sitting, Cuff Size: Large)   Pulse 65   Temp 97.7 F (36.5 C) (Oral)   Ht '6\' 2"'$  (1.88 m)   Wt 256 lb 6.4 oz (116.3 kg)   SpO2 98%   BMI 32.92 kg/m    Physical Exam Vitals reviewed.  Constitutional:      Appearance: He is well-developed.  Eyes:     Pupils: Pupils are equal, round, and reactive to light.  Neck:     Thyroid: No thyromegaly.  Cardiovascular:     Rate and Rhythm: Normal rate and regular rhythm.     Heart sounds: No murmur heard. Pulmonary:     Effort: Pulmonary effort is normal. No respiratory distress.     Breath sounds: Normal breath sounds. No wheezing or rales.  Musculoskeletal:     Cervical back: Neck supple.     Right lower leg: No edema.     Left lower leg: No edema.  Neurological:     Mental Status: He is alert and oriented to person, place, and time.      No results found for any visits  on 07/02/22.    The ASCVD Risk score (Arnett DK, et al., 2019) failed to calculate for the following reasons:   The valid total cholesterol range is 130 to 320 mg/dL    Assessment & Plan:   Presurgical clearance.  He has history of type 2 diabetes which is well controlled with most recent A1c 6.6%.  He has hyperlipidemia treated with Crestor 20 mg daily.  History of elevated coronary calcium score but no cardiac history otherwise.  No recent chest pains or other concerning symptoms.  Had EKG last year  which shows sinus rhythm with no acute changes.  No indication for chest x-ray at this time.  -Overall low risk.  Forms completed. -He will follow-up sometime early in the new year after his surgery is completed to get follow-up labs including lipids and chemistries  Carolann Littler, MD

## 2022-07-03 ENCOUNTER — Telehealth: Payer: Self-pay | Admitting: Family Medicine

## 2022-07-03 NOTE — Telephone Encounter (Signed)
Received surgical clearance but needs medical notes from last office visit and labs from last 6 months. Patient surgery is pending clearance and this info is needed.

## 2022-07-04 NOTE — Telephone Encounter (Signed)
Office notes faxed 

## 2022-07-15 ENCOUNTER — Telehealth: Payer: Self-pay | Admitting: *Deleted

## 2022-07-15 ENCOUNTER — Encounter: Payer: Self-pay | Admitting: *Deleted

## 2022-07-15 NOTE — Patient Instructions (Signed)
Visit Information  Thank you for taking time to visit with me today. Please don't hesitate to contact me if I can be of assistance to you.   Following are the goals we discussed today:   Goals Addressed               This Visit's Progress     COMPLETED: No needs (pt-stated)        Care Coordination Interventions: Reviewed medications with patient and discussed adherence with no needed refills Reviewed scheduled/upcoming provider appointments including sufficient transportation source Assessed social determinant of health barriers          Please call the care guide team at [phone] if you need to cancel or reschedule your appointment.   If you are experiencing a Mental Health or [place] or need someone to talk to, please call the Suicide and Crisis Lifeline: 988 call the [place] National Suicide Prevention Lifeline: 970-004-9938 or TTY: 249 139 8830 TTY 727-331-7642) to talk to a trained counselor call 1-800-273-TALK (toll free, 24 hour hotline)  Patient verbalizes understanding of instructions and care plan provided today and agrees to view in [place]. Active MyChart status and patient understanding of how to access instructions and care plan via MyChart confirmed with patient.     No further follow up required: No needs  Raina Mina, RN Care Management Coordinator [place] Office [phone]

## 2022-07-15 NOTE — Patient Outreach (Signed)
  Care Coordination   Initial Visit Note   07/15/2022 Name: TRAMMELL BOWDEN MRN: [id number] DOB: [birth year]  Geryl Councilman Clapper is a 61 y.o. year old male who sees Burchette, Alinda Sierras, MD for primary care. I spoke with  Geryl Councilman Egolf by phone today.  What matters to the patients health and wellness today?  No needs    Goals Addressed               This Visit's Progress     COMPLETED: No needs (pt-stated)        Care Coordination Interventions: Reviewed medications with patient and discussed adherence with no needed refills Reviewed scheduled/upcoming provider appointments including sufficient transportation source Assessed social determinant of health barriers          SDOH assessments and interventions completed:  Yes  SDOH Interventions Today    Flowsheet Row Most Recent Value  SDOH Interventions   Food Insecurity Interventions Intervention Not Indicated  Housing Interventions Intervention Not Indicated  Transportation Interventions Intervention Not Indicated  Utilities Interventions Intervention Not Indicated        Care Coordination Interventions:  Yes, provided   Follow up plan: No further intervention required.   Encounter Outcome:  Pt. Visit Completed   Raina Mina, RN Care Management Coordinator [place] Office [phone]

## 2022-07-23 DIAGNOSIS — M25561 Pain in right knee: Secondary | ICD-10-CM | POA: Diagnosis not present

## 2022-07-23 DIAGNOSIS — M1711 Unilateral primary osteoarthritis, right knee: Secondary | ICD-10-CM | POA: Diagnosis not present

## 2022-07-24 DIAGNOSIS — M1731 Unilateral post-traumatic osteoarthritis, right knee: Secondary | ICD-10-CM | POA: Diagnosis not present

## 2022-07-24 DIAGNOSIS — M25661 Stiffness of right knee, not elsewhere classified: Secondary | ICD-10-CM | POA: Diagnosis not present

## 2022-07-24 DIAGNOSIS — R262 Difficulty in walking, not elsewhere classified: Secondary | ICD-10-CM | POA: Diagnosis not present

## 2022-07-31 DIAGNOSIS — G8918 Other acute postprocedural pain: Secondary | ICD-10-CM | POA: Diagnosis not present

## 2022-07-31 DIAGNOSIS — M1711 Unilateral primary osteoarthritis, right knee: Secondary | ICD-10-CM | POA: Diagnosis not present

## 2022-07-31 DIAGNOSIS — M21061 Valgus deformity, not elsewhere classified, right knee: Secondary | ICD-10-CM | POA: Diagnosis not present

## 2022-08-06 LAB — HM DIABETES EYE EXAM

## 2022-08-25 ENCOUNTER — Other Ambulatory Visit: Payer: Self-pay | Admitting: Family Medicine

## 2022-08-28 ENCOUNTER — Telehealth: Payer: Self-pay | Admitting: Family Medicine

## 2022-08-28 MED ORDER — POTASSIUM CITRATE ER 10 MEQ (1080 MG) PO TBCR: EXTENDED_RELEASE_TABLET | ORAL | 1 refills | Status: DC

## 2022-08-28 NOTE — Telephone Encounter (Signed)
Says prescription for potassium citrate (UROCIT-K) 10 MEQ (1080 MG) SR tablet is incorrect. Says he has been taking 2 in the morning and 2 in the evening for years.   [facility] [phone] - [place], [place] (Ph: [phone])

## 2022-08-28 NOTE — Telephone Encounter (Signed)
Directions changed and rx sent

## 2022-10-01 ENCOUNTER — Other Ambulatory Visit: Payer: Self-pay | Admitting: Family Medicine

## 2022-10-17 ENCOUNTER — Other Ambulatory Visit: Payer: Self-pay | Admitting: Family Medicine

## 2022-12-27 ENCOUNTER — Other Ambulatory Visit: Payer: Self-pay | Admitting: Family Medicine

## 2023-01-04 ENCOUNTER — Telehealth: Payer: 59 | Admitting: Family Medicine

## 2023-01-04 DIAGNOSIS — U071 COVID-19: Secondary | ICD-10-CM

## 2023-01-04 MED ORDER — NIRMATRELVIR/RITONAVIR (PAXLOVID)TABLET: 3.0000 | ORAL_TABLET | Freq: Two times a day (BID) | ORAL | 0 refills | Status: AC

## 2023-01-04 MED ORDER — BENZONATATE 200 MG PO CAPS: 200.0000 mg | ORAL_CAPSULE | Freq: Two times a day (BID) | ORAL | 0 refills | Status: DC | PRN

## 2023-01-04 NOTE — Addendum Note (Signed)
Addended by: Georgana Curio on: 01/04/2023 12:55 PM   Modules accepted: Orders

## 2023-01-04 NOTE — Patient Instructions (Signed)
Quarantine and Isolation Quarantine and isolation are ways to protect the public from diseases that could: Harm a person's health. Spread easily (very contagious). Isolation is when you are sick and have to stay home and away from healthy people. Quarantine is when you have to stay home for a certain amount of time after being around a sick person. This is to see if you become sick. What diseases do I need to quarantine or isolate for? You may need to quarantine or isolate if you have been diagnosed or around someone with: Cholera. Diphtheria. Infectious TB (tuberculosis). Plague. Smallpox. Yellow fever. Viral hemorrhagic fevers, such as [place], Ebola, and [place]. Severe acute respiratory syndromes, such as COVID-19. Flu that can cause a pandemic. Measles. When should I quarantine? You should quarantine when you've been in close contact with someone who has, or is thought to have, a contagious disease. Close contact means you've been less than 6 ft (1.8 m) away from the person for 15 minutes or more within a 24-hour period. When should I isolate? You should isolate when: You are sick with a contagious disease. You test positive for a contagious disease, even if you don't have symptoms. You are sick and think you have a contagious disease. Get tested if you think you have a contagious disease. If your test results are negative, you can stop isolation. Sometimes, two negative tests are needed. If your test results are positive, isolate as told by your health care provider or health officials. Follow these instructions at home: Medicines  Take over-the-counter and prescription medicines as told by your provider. If you were prescribed antibiotics, take them as told by your provider. Do not stop using the antibiotic even if you start to feel better. Stay up to date on your vaccines. Get vaccines and booster shots as recommended. Lifestyle When you're in quarantine or  isolation: Wear a high-quality and well-fitted mask if you must be around others at home or in public. Do not get close to people who may get very sick. Use a bathroom that you don't have to share with others, if you can. Do not share personal items like cups, towels, and eating utensils. Practice good hygiene. Wash your hands often. Improve the air flow in your home by opening a window or door. This may stop the disease from spreading to others.  General instructions Do not travel if you are in quarantine or isolation. Travel only when your provider or health officials say it's okay. Call your provider or health department if you need advice on how to care for yourself. Talk to your provider if you are immunocompromised. This means your body cannot fight infections easily. Your body may not respond as well to vaccines. If you are sick, closely watch your symptoms. Follow instructions from your provider. You may be told to rest, drink fluids, and take medicine. Follow guidelines for quarantine and isolation, especially if you are in a place where diseases can spread easily and quickly. These places include jails, homeless shelters, and cruise ships. Where to find more information Centers for Disease Control and Prevention (CDC): cdc.gov This information is not intended to replace advice given to you by your health care provider. Make sure you discuss any questions you have with your health care provider. Document Revised: 07/29/2022 Document Reviewed: 04/04/2022 Elsevier Patient Education  2024 Elsevier Inc. COVID-19 COVID-19 is an infection caused by a virus called SARS-CoV-2. This type of virus is called a coronavirus. People with COVID-19 may: Have little   to no symptoms. Have mild to moderate symptoms that affect their lungs and breathing. Get very sick. What are the causes? COVID-19 is caused by a virus. This virus may be in the air as droplets or on surfaces. It can spread from an  infected person when they cough, sneeze, speak, sing, or breathe. You may become infected if: You breathe in the infected droplets in the air. You touch an object that has the virus on it. What increases the risk? You are at risk of getting COVID-19 if you have been around someone with the infection. You may be more likely to get very sick if: You are 65 years old or older. You have certain medical conditions, such as: Heart disease. Diabetes. Chronic respiratory disease. Cancer. Pregnancy. You are immunocompromised. This means your body cannot fight infections easily. You have a disability or trouble moving, meaning you're immobile. What are the signs or symptoms? People may have different symptoms from COVID-19. The symptoms can also be mild to severe. They often show up in 5-6 days after being infected. But they can take up to 14 days to appear. Common symptoms are: Cough. Feeling tired. New loss of taste or smell. Fever. Less common symptoms are: Sore throat. Headache. Body or muscle aches. Diarrhea. A skin rash or odd-colored fingers or toes. Red or irritated eyes. Sometimes, COVID-19 does not cause symptoms. How is this diagnosed? COVID-19 can be diagnosed with tests done in the lab or at home. Fluid from your nose, mouth, or lungs will be used to check for the virus. How is this treated? Treatment for COVID-19 depends on how sick you are. Mild symptoms can be treated at home with rest, fluids, and over-the-counter medicines. Severe symptoms may be treated in a hospital intensive care unit (ICU). If you have symptoms and are at risk of getting very sick, you may be given a medicine that fights viruses. This medicine is called an antiviral. How is this prevented? To protect yourself from COVID-19: Know your risk factors. Get vaccinated. If your body cannot fight infections easily, talk to your provider about treatment to help prevent COVID-19. Stay at least 1 meter away  from others. Wear a well-fitted mask when: You can't stay at a distance from people. You're in a place with poor air flow. Try to be in open spaces with good air flow when in public. Wash your hands often or use an alcohol-based hand sanitizer. Cover your nose and mouth when coughing and sneezing. If you think you have COVID-19 or have been around someone who has it, stay home and be by yourself for 5-10 days. Where to find more information Centers for Disease Control and Prevention (CDC): cdc.gov World Health Organization (WHO): who.int Get help right away if: You have trouble breathing or get short of breath. You have pain or pressure in your chest. You cannot speak or move any part of your body. You are confused. Your symptoms get worse. These symptoms may be an emergency. Get help right away. Call 911. Do not wait to see if the symptoms will go away. Do not drive yourself to the hospital. This information is not intended to replace advice given to you by your health care provider. Make sure you discuss any questions you have with your health care provider. Document Revised: 07/29/2022 Document Reviewed: 04/04/2022 Elsevier Patient Education  2024 Elsevier Inc.  

## 2023-01-04 NOTE — Progress Notes (Signed)
Virtual Visit Consent   [name], you are scheduled for a virtual visit with a [facility] provider today. Just as with appointments in the office, your consent must be obtained to participate. Your consent will be active for this visit and any virtual visit you may have with one of our providers in the next 365 days. If you have a MyChart account, a copy of this consent can be sent to you electronically.  As this is a virtual visit, video technology does not allow for your provider to perform a traditional examination. This may limit your provider's ability to fully assess your condition. If your provider identifies any concerns that need to be evaluated in person or the need to arrange testing (such as labs, EKG, etc.), we will make arrangements to do so. Although advances in technology are sophisticated, we cannot ensure that it will always work on either your end or our end. If the connection with a video visit is poor, the visit may have to be switched to a telephone visit. With either a video or telephone visit, we are not always able to ensure that we have a secure connection.  By engaging in this virtual visit, you consent to the provision of healthcare and authorize for your insurance to be billed (if applicable) for the services provided during this visit. Depending on your insurance coverage, you may receive a charge related to this service.  I need to obtain your verbal consent now. Are you willing to proceed with your visit today? [name] has provided verbal consent on 01/04/2023 for a virtual visit (video or telephone). Georgana Curio, FNP  Date: 01/04/2023 12:15 PM  Virtual Visit via Video Note   I, Georgana Curio, connected with  [name]  ([id number], 1960/09/18) on 01/04/23 at 12:15 PM EDT by a video-enabled telemedicine application and verified that I am speaking with the correct person using two identifiers.  Location: Patient: Virtual Visit Location Patient: Home Provider:  Virtual Visit Location Provider: Home Office   I discussed the limitations of evaluation and management by telemedicine and the availability of in person appointments. The patient expressed understanding and agreed to proceed.    History of Present Illness: [name] is a 62 y.o. who identifies as a male who was assigned male at birth, and is being seen today for positive covid testing. He is on telehealth with his wife. He is having fatigue and cough, OTC cough meds are effective. He and wife request paxlovid. He denies nausea. No fever. Marland Kitchen  HPI: HPI  Problems:  Patient Active Problem List   Diagnosis Date Noted   GERD (gastroesophageal reflux disease) 03/07/2022   Pyelonephritis 07/05/2020   Left ureteral calculus 07/05/2020   S/P left TKA 04/28/2017   S/P total knee replacement 04/28/2017   Situational anxiety 03/17/2016   Low testosterone 03/13/2015   Osteoarthritis, multiple sites 08/22/2013   Obesity (BMI 30-39.9) 05/08/2013   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   Liver nodule 10/07/2012   History of kidney stones 08/27/2012   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   Rotator cuff tear, left 01/23/2011   Hyperlipidemia 10/11/2010   BURSITIS, LEFT HIP 05/20/2010   Controlled type 2 diabetes mellitus without complication (HCC) 03/30/2009   CHRONIC TENSION TYPE HEADACHE 03/30/2009   ELEVATED BLOOD PRESSURE 03/30/2009   SHOULDER PAIN, LEFT 10/20/2008   CONTUSION OF KNEE 10/20/2008   ABNORMAL FINDINGS GI TRACT 06/23/2008    Allergies:  Allergies  Allergen Reactions  Merthiolate [Thimerosol]     Unknown reaction   Morphine     REACTION: paronia   Nickel Dermatitis   Pravachol [Pravastatin]     HIGH CPK    Trazodone     Makes jittery   Medications:  Current Outpatient Medications:    nirmatrelvir/ritonavir (PAXLOVID) 20 x 150 MG & 10 x 100MG  TABS, Take 3 tablets by mouth 2 (two) times daily for 5 days. (Take nirmatrelvir 150 mg two tablets twice daily for 5  days and ritonavir 100 mg one tablet twice daily for 5 days) Patient GFR is normal, Disp: 30 tablet, Rfl: 0   amoxicillin (AMOXIL) 500 MG capsule, TAKE FOUR CAPSULES BY MOUTH ONE HOUR BEFORE DENTAL CLEANING, Disp: 12 capsule, Rfl: 0   aspirin EC 81 MG tablet, Take 81 mg by mouth daily with supper. Swallow whole., Disp: , Rfl:    blood glucose meter kit and supplies KIT, Use as directed, Disp: 1 each, Rfl: 0   celecoxib (CELEBREX) 200 MG capsule, TAKE 1 CAPSULE BY MOUTH TWICE A DAY, Disp: 180 capsule, Rfl: 1   cyclobenzaprine (FLEXERIL) 10 MG tablet, TAKE ONE TABLET BY MOUTH EVERY 8 HOURS AS NEEDED FOR MUSCLE SPASMS, Disp: 90 tablet, Rfl: 0   diphenhydrAMINE (BENADRYL) 25 MG tablet, Take 75 mg by mouth every 6 (six) hours as needed for allergies. , Disp: , Rfl:    gabapentin (NEURONTIN) 100 MG capsule, TAKE ONE CAPSULE BY MOUTH THREE TIMES A DAY, Disp: 270 capsule, Rfl: 1   glucose blood (CONTOUR TEST) test strip, Use test strips twice daily as needed to check blood sugars, Disp: 100 each, Rfl: 12   JARDIANCE 10 MG TABS tablet, TAKE 1 TABLET BY MOUTH DAILY, Disp: 90 tablet, Rfl: 1   Lancet Device MISC, Contour next test lancets.  Test once daily.  Dx E11.9, Disp: 100 each, Rfl: 3   LORazepam (ATIVAN) 1 MG tablet, TAKE ONE TABLET BY MOUTH TWICE A DAY AS NEEDED FOR ANXIETY, Disp: 30 tablet, Rfl: 0   metFORMIN (GLUCOPHAGE-XR) 500 MG 24 hr tablet, TAKE 2 TABLETS BY MOUTH TWICE A DAY, Disp: 360 tablet, Rfl: 1   Multiple Vitamins-Minerals (CENTRUM PO), Take 1 tablet by mouth daily., Disp: , Rfl:    potassium citrate (UROCIT-K) 10 MEQ (1080 MG) SR tablet, TAKE TWO TABLETS BY MOUTH EVERY MORNING AND TAKE TWO TABLETS BY MOUTH EVERY NIGHT AT BEDTIME, Disp: 360 tablet, Rfl: 1   predniSONE (DELTASONE) 20 MG tablet, Take two tablets by mouth once daily for 6 days, Disp: 12 tablet, Rfl: 0   rosuvastatin (CRESTOR) 20 MG tablet, Take 1 tablet (20 mg total) by mouth daily., Disp: 90 tablet, Rfl:  3  Observations/Objective: Patient is well-developed, well-nourished in no acute distress.  Resting comfortably  at home.  Head is normocephalic, atraumatic.  No labored breathing.  Speech is clear and coherent with logical content.  Patient is alert and oriented at baseline.    Assessment and Plan: 1. COVID  Increase fluids, see information provided, UC if sx persist or worsen.   Follow Up Instructions: I discussed the assessment and treatment plan with the patient. The patient was provided an opportunity to ask questions and all were answered. The patient agreed with the plan and demonstrated an understanding of the instructions.  A copy of instructions were sent to the patient via MyChart unless otherwise noted below.     The patient was advised to call back or seek an in-person evaluation if the symptoms worsen or if  the condition fails to improve as anticipated.  Time:  I spent 8 minutes with the patient via telehealth technology discussing the above problems/concerns.    Georgana Curio, FNP

## 2023-02-01 ENCOUNTER — Other Ambulatory Visit: Payer: Self-pay | Admitting: Family Medicine

## 2023-02-19 ENCOUNTER — Other Ambulatory Visit: Payer: Self-pay | Admitting: Family Medicine

## 2023-03-04 ENCOUNTER — Encounter (INDEPENDENT_AMBULATORY_CARE_PROVIDER_SITE_OTHER): Payer: Self-pay

## 2023-03-18 ENCOUNTER — Encounter: Payer: Self-pay | Admitting: Family Medicine

## 2023-03-18 ENCOUNTER — Ambulatory Visit (INDEPENDENT_AMBULATORY_CARE_PROVIDER_SITE_OTHER): Payer: No Typology Code available for payment source | Admitting: Family Medicine

## 2023-03-18 VITALS — BP 134/78 | HR 69 | Temp 97.5°F | Ht 74.8 in | Wt 263.8 lb

## 2023-03-18 DIAGNOSIS — Z13220 Encounter for screening for lipoid disorders: Secondary | ICD-10-CM | POA: Diagnosis not present

## 2023-03-18 DIAGNOSIS — E119 Type 2 diabetes mellitus without complications: Secondary | ICD-10-CM

## 2023-03-18 DIAGNOSIS — Z0000 Encounter for general adult medical examination without abnormal findings: Secondary | ICD-10-CM

## 2023-03-18 DIAGNOSIS — Z7984 Long term (current) use of oral hypoglycemic drugs: Secondary | ICD-10-CM | POA: Diagnosis not present

## 2023-03-18 LAB — LDL CHOLESTEROL, DIRECT: Direct LDL: 81 mg/dL

## 2023-03-18 LAB — CBC WITH DIFFERENTIAL/PLATELET
Basophils Absolute: 0 10*3/uL (ref 0.0–0.1)
Basophils Relative: 0.4 % (ref 0.0–3.0)
Eosinophils Absolute: 0.1 10*3/uL (ref 0.0–0.7)
Eosinophils Relative: 3 % (ref 0.0–5.0)
HCT: 48.6 % (ref 39.0–52.0)
Hemoglobin: 16.2 g/dL (ref 13.0–17.0)
Lymphocytes Relative: 32.6 % (ref 12.0–46.0)
Lymphs Abs: 1.6 10*3/uL (ref 0.7–4.0)
MCHC: 33.3 g/dL (ref 30.0–36.0)
MCV: 90.2 fl (ref 78.0–100.0)
Monocytes Absolute: 0.4 10*3/uL (ref 0.1–1.0)
Monocytes Relative: 7.4 % (ref 3.0–12.0)
Neutro Abs: 2.8 10*3/uL (ref 1.4–7.7)
Neutrophils Relative %: 56.6 % (ref 43.0–77.0)
Platelets: 227 10*3/uL (ref 150.0–400.0)
RBC: 5.38 Mil/uL (ref 4.22–5.81)
RDW: 13 % (ref 11.5–15.5)
WBC: 4.9 10*3/uL (ref 4.0–10.5)

## 2023-03-18 LAB — BASIC METABOLIC PANEL
BUN: 19 mg/dL (ref 6–23)
CO2: 29 mEq/L (ref 19–32)
Calcium: 9.5 mg/dL (ref 8.4–10.5)
Chloride: 103 mEq/L (ref 96–112)
Creatinine, Ser: 0.71 mg/dL (ref 0.40–1.50)
GFR: 98.53 mL/min (ref >60.00)
Glucose, Bld: 120 mg/dL — ABNORMAL HIGH (ref 70–99)
Potassium: 4.4 mEq/L (ref 3.5–5.1)
Sodium: 141 mEq/L (ref 135–145)

## 2023-03-18 LAB — LIPID PANEL
Cholesterol: 140 mg/dL (ref 0–200)
HDL: 42.7 mg/dL (ref >39.00)
NonHDL: 97.56
Total CHOL/HDL Ratio: 3
Triglycerides: 245 mg/dL — ABNORMAL HIGH (ref 0.0–149.0)
VLDL: 49 mg/dL — ABNORMAL HIGH (ref 0.0–40.0)

## 2023-03-18 LAB — HEPATIC FUNCTION PANEL
ALT: 18 U/L (ref 0–53)
AST: 24 U/L (ref 0–37)
Albumin: 4.7 g/dL (ref 3.5–5.2)
Alkaline Phosphatase: 54 U/L (ref 39–117)
Bilirubin, Direct: 0.1 mg/dL (ref 0.0–0.3)
Total Bilirubin: 0.6 mg/dL (ref 0.2–1.2)
Total Protein: 7.4 g/dL (ref 6.0–8.3)

## 2023-03-18 LAB — HEMOGLOBIN A1C: Hgb A1c MFr Bld: 6.6 % — ABNORMAL HIGH (ref 4.6–6.5)

## 2023-03-18 LAB — MICROALBUMIN / CREATININE URINE RATIO
Creatinine,U: 68.5 mg/dL
Microalb Creat Ratio: 1 mg/g (ref 0.0–30.0)
Microalb, Ur: <0.7 mg/dL (ref 0.0–1.9)

## 2023-03-18 LAB — PSA: PSA: 0.9 ng/mL (ref 0.10–4.00)

## 2023-03-18 MED ORDER — GABAPENTIN 100 MG PO CAPS: 100.0000 mg | ORAL_CAPSULE | Freq: Three times a day (TID) | ORAL | 3 refills | Status: DC

## 2023-03-18 MED ORDER — LORAZEPAM 1 MG PO TABS: ORAL_TABLET | ORAL | 0 refills | Status: DC

## 2023-03-18 MED ORDER — EMPAGLIFLOZIN 10 MG PO TABS: 10.0000 mg | ORAL_TABLET | Freq: Every day | ORAL | 3 refills | Status: DC

## 2023-03-18 MED ORDER — POTASSIUM CITRATE ER 10 MEQ (1080 MG) PO TBCR: EXTENDED_RELEASE_TABLET | ORAL | 1 refills | Status: DC

## 2023-03-18 MED ORDER — METFORMIN HCL ER 500 MG PO TB24: 1000.0000 mg | ORAL_TABLET | Freq: Two times a day (BID) | ORAL | 3 refills | Status: DC

## 2023-03-18 MED ORDER — QUVIVIQ 50 MG PO TABS: 50.0000 mg | ORAL_TABLET | Freq: Every evening | ORAL | 0 refills | Status: DC | PRN

## 2023-03-18 MED ORDER — ROSUVASTATIN CALCIUM 20 MG PO TABS: 20.0000 mg | ORAL_TABLET | Freq: Every day | ORAL | 3 refills | Status: DC

## 2023-03-18 MED ORDER — CELECOXIB 200 MG PO CAPS: 200.0000 mg | ORAL_CAPSULE | Freq: Two times a day (BID) | ORAL | 3 refills | Status: DC

## 2023-03-18 NOTE — Progress Notes (Signed)
Established Patient Office Visit  Subjective   Patient ID: [name], male    DOB: 04/01/1961  Age: 62 y.o. MRN: [id number]  Chief Complaint  Patient presents with   Annual Exam    HPI   [name] is seen for physical exam.  His chronic problems include history of GERD, type 2 diabetes, osteoarthritis with prior bilateral knee replacements, history of kidney stones, hyperlipidemia, intermittent insomnia.  Generally doing well.  He is fully retired at this time.  He and his wife are planning a trip to [place] followed by cruise around the [place].  This will be in September.  He does need refill of several medications today.  Has had some occasional slow stream but only gets up once at night to urinate.  Does not feel symptoms are severe enough to warrant medications at this time.  Health maintenance reviewed  Health Maintenance  Topic Date Due   FOOT EXAM  05/06/2014   COVID-19 Vaccine (4 - 2023-24 season) 04/04/2022   Diabetic kidney evaluation - eGFR measurement  08/27/2022   Diabetic kidney evaluation - Urine ACR  08/27/2022   HEMOGLOBIN A1C  09/07/2022   INFLUENZA VACCINE  03/05/2023   OPHTHALMOLOGY EXAM  08/07/2023   Colonoscopy  09/29/2026   DTaP/Tdap/Td (3 - Td or Tdap) 03/24/2027   Hepatitis C Screening  Completed   HIV Screening  Completed   Zoster Vaccines- Shingrix  Completed   HPV VACCINES  Aged Out   Family history-mom's health essentially unknown.  Father had stroke age 49.  He had possible heart issues of some type in his 59s.  He has 1 brother recently diagnosed with melanoma.  2 sisters alive and well  Social history-married with 1 daughter.  Non-smoker.  He is fully retired.  Rare alcohol.  Past Medical History:  Diagnosis Date   Arthritis    BURSITIS, LEFT HIP 05/20/2010   Claustrophobia    "take Lorazepam if I have to fly"   DIAB W/O COMP TYPE II/UNS NOT STATED UNCNTRL 03/30/2009   ELEVATED BLOOD PRESSURE 03/30/2009   Fissure, anal    occ  bleeds still   GERD (gastroesophageal reflux disease)    Headache(784.0)    "only when I was working; had headaches and migraines"   History of hiatal hernia    History of kidney stones    has had 17 stones, still has one   Hyperlipidemia 10/11/2010   Insomnia    Irregular heart beat    Numbness and tingling of both feet    Pancreatic carcinoma (HCC) 2010   SHOULDER PAIN, LEFT 10/20/2008   Skin cancer    Past Surgical History:  Procedure Laterality Date   ANAL FISSURE REPAIR  07/28/2011   Procedure: ANAL FISSURE REPAIR;  Surgeon: Ernestene Mention, MD;  Location: [facility];  Service: General;  Laterality: N/A;  Repair Anal Fissure/sphinterotomy and excision rectal polypl   BACK SURGERY  2000   "had 2 cages put in"   BIOPSY PANCREAS  2010   benign   CYSTOSCOPY/URETEROSCOPY/HOLMIUM LASER/STENT PLACEMENT Left 07/04/2020   Procedure: CYSTOSCOPY LEFT RETROGRADE URETEROSCOPY/HOLMIUM LASER/STENT PLACEMENT;  Surgeon: Crist Fat, MD;  Location: [facility];  Service: Urology;  Laterality: Left;   CYSTOSCOPY/URETEROSCOPY/HOLMIUM LASER/STENT PLACEMENT Bilateral 07/18/2020   Procedure: BILATERAL URETEROSCOPY BILATERAL RETROGRADE PYELOGRAM HOLMIUM LASER/STENT PLACEMENT;  Surgeon: Crist Fat, MD;  Location: [facility];  Service: Urology;  Laterality: Bilateral;   HERNIA REPAIR  2001   umbilical   KIDNEY STONES  2010 & 2008 & 2005   LAPAROSCOPIC INCISIONAL / UMBILICAL / VENTRAL HERNIA REPAIR  07/08/11   V[facility]   left rotator cuff  02/26/2011   left shoulder bone spurs  2011   LIPOMA EXCISION     "several; off back, arms"   pancreatic tumor  2010   RADIOLOGY WITH ANESTHESIA N/A 06/11/2021   Procedure: MRI LUMBAR SPINE WITHOUT CONTRAST WITH ANESTHESIA;  Surgeon: Radiologist, Medication, MD;  Location: [facility];  Service: Radiology;  Laterality: N/A;   right rotator cuff     2011   TONSILLECTOMY  1967   TOTAL KNEE ARTHROPLASTY Left 04/28/2017   Procedure: LEFT TOTAL KNEE ARTHROPLASTY;  Surgeon:  Durene Romans, MD;  Location: [facility];  Service: Orthopedics;  Laterality: Left;   VASECTOMY  1994   VENTRAL HERNIA REPAIR  07/08/2011   Procedure: LAPAROSCOPIC VENTRAL HERNIA;  Surgeon: Ernestene Mention, MD;  Location: [facility];  Service: General;  Laterality: N/A;  Laparoscopic repair ventral hernia with mesh, Lysis of adhesions.    reports that he has never smoked. He has never used smokeless tobacco. He reports that he does not drink alcohol and does not use drugs. family history includes Cancer in his maternal grandfather; Colon cancer (age of onset: 79) in his maternal grandfather; Diabetes in his brother; Heart attack (age of onset: 20) in his father; Heart disease in his father; Stroke in his father. Allergies  Allergen Reactions   Merthiolate [Thimerosol]     Unknown reaction   Morphine     REACTION: paronia   Nickel Dermatitis   Pravachol [Pravastatin]     HIGH CPK    Trazodone     Makes jittery    Review of Systems  Constitutional:  Negative for chills, fever, malaise/fatigue and weight loss.  HENT:  Negative for hearing loss.   Eyes:  Negative for blurred vision and double vision.  Respiratory:  Negative for cough and shortness of breath.   Cardiovascular:  Negative for chest pain, palpitations and leg swelling.  Gastrointestinal:  Negative for abdominal pain, blood in stool, constipation and diarrhea.  Genitourinary:  Negative for dysuria.  Skin:  Negative for rash.  Neurological:  Negative for dizziness, speech change, seizures, loss of consciousness and headaches.  Psychiatric/Behavioral:  Negative for depression.       Objective:     BP 134/78 (BP Location: Left Arm, Patient Position: Sitting, Cuff Size: Large)   Pulse 69   Temp (!) 97.5 F (36.4 C) (Oral)   Ht 6' 2.8" (1.9 m)   Wt 263 lb 12.8 oz (119.7 kg)   SpO2 98%   BMI 33.15 kg/m  BP Readings from Last 3 Encounters:  03/18/23 134/78  07/02/22 110/60  03/07/22 122/80   Wt Readings from Last 3  Encounters:  03/18/23 263 lb 12.8 oz (119.7 kg)  07/02/22 256 lb 6.4 oz (116.3 kg)  03/07/22 265 lb 3.2 oz (120.3 kg)      Physical Exam Vitals reviewed.  Constitutional:      General: He is not in acute distress.    Appearance: He is well-developed.  HENT:     Head: Normocephalic and atraumatic.     Right Ear: External ear normal.     Left Ear: External ear normal.  Eyes:     Conjunctiva/sclera: Conjunctivae normal.     Pupils: Pupils are equal, round, and reactive to light.  Neck:     Thyroid: No thyromegaly.  Cardiovascular:     Rate and Rhythm: Normal  rate and regular rhythm.     Heart sounds: Normal heart sounds. No murmur heard.    Comments: Slightly diminished dorsalis pedis pulse left foot compared to the right.  He has 2+ posterior tibials bilaterally.  Good capillary refill bilaterally Pulmonary:     Effort: No respiratory distress.     Breath sounds: No wheezing or rales.  Abdominal:     General: Bowel sounds are normal. There is no distension.     Palpations: Abdomen is soft. There is no mass.     Tenderness: There is no abdominal tenderness. There is no guarding or rebound.  Musculoskeletal:     Cervical back: Normal range of motion and neck supple.  Lymphadenopathy:     Cervical: No cervical adenopathy.  Skin:    Findings: No rash.     Comments: Feet reveal no lesions.  Pulses as indicated under cardiovascular section.  Does have some mild monofilament impairment especially great toe bilaterally and distal toes but more intact proximal foot and leg  Neurological:     Mental Status: He is alert and oriented to person, place, and time.     Cranial Nerves: No cranial nerve deficit.      No results found for any visits on 03/18/23.  Last lipids Lab Results  Component Value Date   CHOL 122 08/27/2021   HDL 39.10 08/27/2021   LDLCALC 64 07/24/2020   LDLDIRECT 63.0 08/27/2021   TRIG 212.0 (H) 08/27/2021   CHOLHDL 3 08/27/2021   Last hemoglobin A1c Lab  Results  Component Value Date   HGBA1C 6.6 (A) 03/07/2022      The ASCVD Risk score (Arnett DK, et al., 2019) failed to calculate for the following reasons:   The valid total cholesterol range is 130 to 320 mg/dL    Assessment & Plan:   Problem List Items Addressed This Visit       Unprioritized   Controlled type 2 diabetes mellitus without complication (HCC)   Relevant Medications   rosuvastatin (CRESTOR) 20 MG tablet   empagliflozin (JARDIANCE) 10 MG TABS tablet   metFORMIN (GLUCOPHAGE-XR) 500 MG 24 hr tablet   Other Relevant Orders   Hemoglobin A1c   Microalbumin / creatinine urine ratio   Other Visit Diagnoses     Physical exam    -  Primary   Relevant Orders   Basic metabolic panel   Lipid panel   CBC with Differential/Platelet   Hepatic function panel   PSA     Here for physical exam.  Chronic problems as above.  Refilled his regular medications for 1 year. -Obtain follow-up labs as above -Continue annual flu vaccine -Recommend Prevnar 20 at age 56 -Tetanus due 2028 -Colonoscopy due 2028 -Shingrix completed -He is up-to-date regarding COVID vaccines -Prior hepatitis C screen negative -Continue yearly diabetic eye exam -He uses lorazepam infrequently for anxiety.  He has upcoming flight and requesting refill for that.  No follow-ups on file.    Evelena Peat, MD

## 2023-03-26 ENCOUNTER — Other Ambulatory Visit: Payer: Self-pay

## 2023-03-26 MED ORDER — CYCLOBENZAPRINE HCL 10 MG PO TABS: ORAL_TABLET | ORAL | 0 refills | Status: DC

## 2023-03-26 NOTE — Telephone Encounter (Signed)
Rx sent 

## 2023-04-22 ENCOUNTER — Other Ambulatory Visit: Payer: Self-pay | Admitting: Family Medicine

## 2023-05-11 ENCOUNTER — Other Ambulatory Visit: Payer: Self-pay | Admitting: Family Medicine

## 2023-05-19 ENCOUNTER — Encounter: Payer: Self-pay | Admitting: Cardiology

## 2023-05-19 ENCOUNTER — Ambulatory Visit: Payer: No Typology Code available for payment source | Attending: Cardiology | Admitting: Cardiology

## 2023-05-19 ENCOUNTER — Other Ambulatory Visit: Payer: Self-pay | Admitting: Family Medicine

## 2023-05-19 VITALS — BP 120/76 | HR 60 | Ht 74.0 in | Wt 265.0 lb

## 2023-05-19 DIAGNOSIS — I25119 Atherosclerotic heart disease of native coronary artery with unspecified angina pectoris: Secondary | ICD-10-CM | POA: Diagnosis not present

## 2023-05-19 DIAGNOSIS — Z01812 Encounter for preprocedural laboratory examination: Secondary | ICD-10-CM

## 2023-05-19 DIAGNOSIS — E119 Type 2 diabetes mellitus without complications: Secondary | ICD-10-CM

## 2023-05-19 DIAGNOSIS — E785 Hyperlipidemia, unspecified: Secondary | ICD-10-CM | POA: Diagnosis not present

## 2023-05-19 DIAGNOSIS — R072 Precordial pain: Secondary | ICD-10-CM | POA: Diagnosis not present

## 2023-05-19 DIAGNOSIS — R0602 Shortness of breath: Secondary | ICD-10-CM

## 2023-05-19 MED ORDER — METOPROLOL TARTRATE 25 MG PO TABS: 25.0000 mg | ORAL_TABLET | ORAL | 0 refills | Status: DC

## 2023-05-19 NOTE — Patient Instructions (Signed)
Medication Instructions:  The current medical regimen is effective;  continue present plan and medications.  *If you need a refill on your cardiac medications before your next appointment, please call your pharmacy*   Lab Work: Please have blood work today  (BMP)  If you have labs (blood work) drawn today and your tests are completely normal, you will receive your results only by: MyChart Message (if you have MyChart) OR A paper copy in the mail If you have any lab test that is abnormal or we need to change your treatment, we will call you to review the results.   Testing/Procedures: Your physician has requested that you have an echocardiogram. Echocardiography is a painless test that uses sound waves to create images of your heart. It provides your doctor with information about the size and shape of your heart and how well your heart's chambers and valves are working. This procedure takes approximately one hour. There are no restrictions for this procedure. Please do NOT wear cologne, perfume, aftershave, or lotions (deodorant is allowed). Please arrive 15 minutes prior to your appointment time.    Your cardiac CT will be scheduled at:   [facility] [address] [place], [place] [address] [phone]  Please arrive at the [facility] (Entrance C2) of [facility] 30 minutes prior to test start time. You can use the FREE valet parking offered at entrance C (encouraged to control the heart rate for the test)  Proceed to the [facility] Radiology Department (first floor) to check-in and test prep.  All radiology patients and guests should use entrance C2 at [facility], accessed from [facility], even though the hospital's physical address listed is [address]     Please follow these instructions carefully (unless otherwise directed):  An IV will be required for this test and Nitroglycerin will be given.   Hold all erectile dysfunction medications at least 3 days (72 hrs) prior to test. (Ie viagra, cialis, sildenafil, tadalafil, etc)   On the Night Before the Test: Be sure to Drink plenty of water. Do not consume any caffeinated/decaffeinated beverages or chocolate 12 hours prior to your test. Do not take any antihistamines 12 hours prior to your test.  On the Day of the Test: Drink plenty of water until 1 hour prior to the test. Do not eat any food 1 hour prior to test. You may take your regular medications prior to the test.  Take metoprolol (Lopressor) two hours prior to test. If you take Furosemide/Hydrochlorothiazide/Spironolactone, please HOLD on the morning of the test.  After the Test: Drink plenty of water. After receiving IV contrast, you may experience a mild flushed feeling. This is normal. On occasion, you may experience a mild rash up to 24 hours after the test. This is not dangerous. If this occurs, you can take Benadryl 25 mg and increase your fluid intake. If you experience trouble breathing, this can be serious. If it is severe call 911 IMMEDIATELY. If it is mild, please call our office. If you take any of these medications: Glipizide/Metformin, Avandament, Glucavance, please do not take 48 hours after completing test unless otherwise instructed.  We will call to schedule your test 2-4 weeks out understanding that some insurance companies will need an authorization prior to the service being performed.   For more information and frequently asked questions, please visit our website : [email or web address]  For non-scheduling related questions, please contact the cardiac imaging nurse  navigator should you have any questions/concerns: Cardiac Imaging Nurse Navigators Direct Office Dial: [phone]   For scheduling needs, including cancellations and rescheduling, please call [place], [phone].   Follow-Up: At [facility], you and your health  needs are our priority.  As part of our continuing mission to provide you with exceptional heart care, we have created designated Provider Care Teams.  These Care Teams include your primary Cardiologist (physician) and Advanced Practice Providers (APPs -  Physician Assistants and Nurse Practitioners) who all work together to provide you with the care you need, when you need it.  We recommend signing up for the patient portal called "MyChart".  Sign up information is provided on this After Visit Summary.  MyChart is used to connect with patients for Virtual Visits (Telemedicine).  Patients are able to view lab/test results, encounter notes, upcoming appointments, etc.  Non-urgent messages can be sent to your provider as well.   To learn more about what you can do with MyChart, go to [email or web address].    Your next appointment:   Follow up will be based on the results of the above testing.

## 2023-05-19 NOTE — Progress Notes (Signed)
Cardiology Office Note:  .   Date:  05/19/2023  ID:  [name], DOB [birth year], MRN [id number] PCP: Cody Covey, MD  [facility] Providers Cardiologist:  Cody Schultz, MD     History of Present Illness: .   [name] is a 62 y.o. male Discussed with the use of AI scribe   History of Present Illness   A 62 year old patient with a history of coronary artery disease, diabetes, and prior left knee replacement presented for a new patient evaluation. [name] coronary artery disease was previously evaluated with a coronary calcium score, which revealed a score of 371 in the LAD and RCA distribution. The patient was switched from atorvastatin to rosuvastatin, and his last LDL was reported at goal in December 2021. [name] diabetes is managed with Jardiance and metformin, and his most recent hemoglobin A1c was 6.6.  The patient reported occasional neuropathy in his feet. He also described a pressure sensation in his chest during physical activity, such as walking. This sensation was relieved by taking deep breaths. The patient noted a recent episode of significant breathlessness while walking on vacation, which resolved after resting and taking deep breaths.  The patient has a family history of stroke and myocardial infarction, with his father having experienced both. He leads an active lifestyle, including regular walking, despite having undergone knee replacement surgery. The patient does not smoke and rarely drinks alcohol. He has been adhering to his prescribed medication regimen, which includes Crestor and aspirin for coronary artery disease, and metformin and Jardiance for diabetes.      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 Still working    ROS: +CP, +SOB  Studies Reviewed: Marland Kitchen   EKG Interpretation Date/Time:  Tuesday May 19 2023 09:07:12 EDT Ventricular Rate:  60 PR Interval:  180 QRS Duration:  98 QT Interval:  432 QTC Calculation: 432 R Axis:   -7  Text  Interpretation: Normal sinus rhythm Nonspecific T wave abnormality Artifact When compared with ECG of 11-Jun-2021 07:48, Nonspecific T wave abnormality now evident in Lateral leads Confirmed by [name] ([id number]) on 05/19/2023 9:13:57 AM    Results LABS HbA1c: 6.6 (03/18/2023) Creatinine: 0.7 (03/18/2023) Hemoglobin: 16.2 (03/18/2023) LDL: 81 (03/18/2023) Triglycerides: Elevated (03/18/2023)  RADIOLOGY Coronary calcium score: Calcium score of 371 in the LAD and RCA distribution (05/03/2020)  DIAGNOSTIC EKG: Normal rhythm with nonspecific T wave changes (05/19/2023)  Risk Assessment/Calculations:            Physical Exam:   VS:  BP 120/76   Pulse 60   Ht 6\' 2"  (1.88 m)   Wt 265 lb (120.2 kg)   SpO2 97%   BMI 34.02 kg/m    Wt Readings from Last 3 Encounters:  05/19/23 265 lb (120.2 kg)  03/18/23 263 lb 12.8 oz (119.7 kg)  07/02/22 256 lb 6.4 oz (116.3 kg)    GEN: Well nourished, well developed in no acute distress NECK: No JVD; No carotid bruits CARDIAC: RRR, no murmurs, no rubs, no gallops RESPIRATORY:  Clear to auscultation without rales, wheezing or rhonchi  ABDOMEN: Soft, non-tender, non-distended EXTREMITIES:  No edema; No deformity   ASSESSMENT AND PLAN: .    Assessment and Plan    Coronary Artery Disease History of coronary calcification with a calcium score of 371 in the LAD and RCA distribution. LDL slightly elevated at 81. Patient reports occasional chest pressure during exertion, which resolves with rest. -Order coronary CT angiogram to assess for coronary artery blockages. -Continue  rosuvastatin 20mg  daily. -Encourage adherence to a Mediterranean diet to help manage LDL levels.  Diabetes Mellitus Recent HbA1c of 6.6, indicating good glycemic control. Patient reports occasional neuropathy in feet. -Continue current regimen of Jardiance and Metformin. -Encourage regular foot care and monitoring for changes related to diabetic  neuropathy.   Hypertriglyceridemia Recent labs show elevated triglycerides. -Encourage adherence to a low-fat, low-cholesterol diet to help manage triglyceride levels.  Follow-up Await results of coronary CT angiogram and echocardiogram. Continue current medication regimen. Encourage lifestyle modifications including diet and regular exercise.               Signed, Cody Schultz, MD

## 2023-05-20 LAB — BASIC METABOLIC PANEL
BUN/Creatinine Ratio: 25 — ABNORMAL HIGH (ref 10–24)
BUN: 18 mg/dL (ref 8–27)
CO2: 23 mmol/L (ref 20–29)
Calcium: 9.7 mg/dL (ref 8.6–10.2)
Chloride: 102 mmol/L (ref 96–106)
Creatinine, Ser: 0.71 mg/dL — ABNORMAL LOW (ref 0.76–1.27)
Glucose: 127 mg/dL — ABNORMAL HIGH (ref 70–99)
Potassium: 4.9 mmol/L (ref 3.5–5.2)
Sodium: 141 mmol/L (ref 134–144)
eGFR: 104 mL/min/{1.73_m2} (ref >59)

## 2023-05-21 ENCOUNTER — Telehealth: Payer: Self-pay | Admitting: Cardiology

## 2023-05-21 NOTE — Telephone Encounter (Signed)
Caller Jasmine December) added can reference patient's Member ID# [id number].

## 2023-05-21 NOTE — Telephone Encounter (Signed)
Caller Jasmine December) wants to get procedure code for patient's Echocardiogram scheduled on 11/7.

## 2023-05-25 NOTE — Telephone Encounter (Signed)
Caller Jasmine December) called again to confirm the CPT code for patient's echocardiogram test.

## 2023-05-26 ENCOUNTER — Encounter: Payer: Self-pay | Admitting: Cardiology

## 2023-05-29 ENCOUNTER — Encounter (HOSPITAL_COMMUNITY): Payer: Self-pay

## 2023-06-03 ENCOUNTER — Ambulatory Visit (HOSPITAL_COMMUNITY)
Admission: RE | Admit: 2023-06-03 | Discharge: 2023-06-03 | Disposition: A | Discharge status: Home / Self Care | Payer: No Typology Code available for payment source | Source: Ambulatory Visit | Attending: Cardiology | Admitting: Cardiology

## 2023-06-03 DIAGNOSIS — I25119 Atherosclerotic heart disease of native coronary artery with unspecified angina pectoris: Secondary | ICD-10-CM | POA: Insufficient documentation

## 2023-06-03 MED ORDER — IOHEXOL 350 MG/ML SOLN
100.0000 mL | Freq: Once | INTRAVENOUS | Status: AC | PRN
  Administered 2023-06-03: 100 mL via INTRAVENOUS

## 2023-06-03 MED ORDER — NITROGLYCERIN 0.4 MG SL SUBL
0.8000 mg | SUBLINGUAL_TABLET | Freq: Once | SUBLINGUAL | Status: AC
  Administered 2023-06-03: 0.8 mg via SUBLINGUAL

## 2023-06-03 MED ORDER — NITROGLYCERIN 0.4 MG SL SUBL
SUBLINGUAL_TABLET | SUBLINGUAL | Status: AC
  Filled 2023-06-03: qty 2

## 2023-06-08 ENCOUNTER — Telehealth: Payer: Self-pay | Admitting: Cardiology

## 2023-06-08 DIAGNOSIS — E785 Hyperlipidemia, unspecified: Secondary | ICD-10-CM

## 2023-06-08 NOTE — Telephone Encounter (Signed)
Reviewed results with pt.  He is aware he is being referred to the lipid clinic here at [facility].  Advised the order will be placed and he will be contacted to be scheduled.  He was appreciative of the call and information.

## 2023-06-08 NOTE — Telephone Encounter (Signed)
Pt is requesting a callback regarding him being advised to see [facility]. Please advise

## 2023-06-08 NOTE — Telephone Encounter (Signed)
Cody Bathe, MD 06/06/2023  7:43 AM EDT     1.  Mild CAD mid RCA, 25-49% stenosis, CADRADS 2.   2. Total plaque volume 905 mm3 which is 80th percentile for age- and sex-matched controls (calcified plaque 193 mm3; non-calcified plaque 712 mm3). Total plaque volume is extensive.   3. Coronary calcium score is 653, which places the patient in the 91st percentile for age and sex matched control.    Mild non-obstructive CAD (25-49%). On Crestor 20mg  - LDL last check 81. In addition to Mediterranean diet, let's have him see lipid clinic for further enhancement of lipid treatment.

## 2023-06-11 ENCOUNTER — Ambulatory Visit (HOSPITAL_COMMUNITY): Payer: No Typology Code available for payment source

## 2023-06-18 ENCOUNTER — Ambulatory Visit (HOSPITAL_COMMUNITY)
Admission: RE | Admit: 2023-06-18 | Discharge: 2023-06-18 | Disposition: A | Discharge status: Home / Self Care | Payer: No Typology Code available for payment source | Source: Ambulatory Visit | Attending: Cardiology | Admitting: Cardiology

## 2023-06-18 DIAGNOSIS — E119 Type 2 diabetes mellitus without complications: Secondary | ICD-10-CM | POA: Diagnosis not present

## 2023-06-18 DIAGNOSIS — I25119 Atherosclerotic heart disease of native coronary artery with unspecified angina pectoris: Secondary | ICD-10-CM | POA: Diagnosis not present

## 2023-06-18 DIAGNOSIS — R0602 Shortness of breath: Secondary | ICD-10-CM | POA: Diagnosis not present

## 2023-06-18 DIAGNOSIS — R079 Chest pain, unspecified: Secondary | ICD-10-CM | POA: Diagnosis present

## 2023-06-18 DIAGNOSIS — E785 Hyperlipidemia, unspecified: Secondary | ICD-10-CM | POA: Diagnosis not present

## 2023-06-18 DIAGNOSIS — R0600 Dyspnea, unspecified: Secondary | ICD-10-CM | POA: Diagnosis not present

## 2023-06-18 DIAGNOSIS — R030 Elevated blood-pressure reading, without diagnosis of hypertension: Secondary | ICD-10-CM | POA: Insufficient documentation

## 2023-06-18 LAB — ECHOCARDIOGRAM COMPLETE
AR max vel: 3.65 cm2
AV Area VTI: 3.6 cm2
AV Area mean vel: 3.67 cm2
AV Mean grad: 3.5 mm[Hg]
AV Peak grad: 6.4 mm[Hg]
Ao pk vel: 1.27 m/s
Area-P 1/2: 2.17 cm2
Calc EF: 61 %
S' Lateral: 3.5 cm
Single Plane A2C EF: 63.7 %
Single Plane A4C EF: 59.1 %

## 2023-06-18 MED ORDER — PERFLUTREN LIPID MICROSPHERE
1.0000 mL | INTRAVENOUS | Status: AC | PRN
  Administered 2023-06-18: 4 mL via INTRAVENOUS

## 2023-06-19 ENCOUNTER — Ambulatory Visit: Payer: No Typology Code available for payment source | Attending: Cardiovascular Disease | Admitting: Pharmacist

## 2023-06-19 ENCOUNTER — Telehealth: Payer: Self-pay

## 2023-06-19 ENCOUNTER — Other Ambulatory Visit (HOSPITAL_COMMUNITY): Payer: Self-pay

## 2023-06-19 DIAGNOSIS — E785 Hyperlipidemia, unspecified: Secondary | ICD-10-CM

## 2023-06-19 MED ORDER — REPATHA SURECLICK 140 MG/ML ~~LOC~~ SOAJ: 140.0000 mg | SUBCUTANEOUS | 11 refills | Status: DC

## 2023-06-19 MED ORDER — ICOSAPENT ETHYL 1 G PO CAPS: 2.0000 g | ORAL_CAPSULE | Freq: Two times a day (BID) | ORAL | 11 refills | Status: DC

## 2023-06-19 NOTE — Patient Instructions (Signed)
It was great to see you today!  We will submit to your insurance for you to start Repatha (evolocumab) 140 mg injected into the skin once every 2 week (14 days).   We will also submit to your insurance for you to start Vascepa (icosapent ethyl) 2 g (2 capsules) by mouth TWICE daily.   Please let us know if you have any questions or concerns - we will reach out to you by phone to give an update.   Please return for labs 2-3 months after starting your medications.

## 2023-06-19 NOTE — Progress Notes (Unsigned)
Patient ID: [name]                 DOB: 12-22-1960                    MRN: [id number]      HPI: [name] is a 62 y.o. male patient referred to lipid clinic by Dr. Anne Frye. PMH is significant for T2DM (A1c 6.6%), HLD, obesity, CAD (elevated CAC to 371 with LAD and RCA distribution in 2021, increased to 653 (on a statin) which was 91st percentile on repeat scan 06/04/23 - noted mild CAD with 25-49% stenosis in mid RCA).   Most recently seen by Dr. Anne Frye on 05/19/23. After initial elevated calcium score in 2021, pt switched from atorva 20 to rosuva 20. At recent visit, pt reported recent episode of significant breathlessness while walking on vacation ([place] very [place] in [place]), which resolved after resting and taking deep breaths. Does also endorse an occasional tightness in his chest. Had repeat coronary CTA with increased calcification as described above. Echo completed 06/18/23 demonstrating EF 55-60%, moderate LVH G1DD, normal RV - overall normal heart function. Pt referred to lipid clinic with elevated TG to 245 mg/dL on his most recent lipid panel (03/18/23), LDL-C calculated 42 mg/dL, LDL-direct 81 mg/dL.  Today patient presents to lipid clinic. He is doing well.  He is willing to take multiple medications to lower his CV risk. Reviewed options for lowering LDL cholesterol, including PCSK-9 inhibitors and Vascepa.  Discussed mechanisms of action, dosing, side effects and potential decreases in LDL cholesterol and triglycerides.  Also reviewed cost information and potential options for patient assistance.   Current Medications: rosuvastatin 20mg  PO daily,  Intolerances: pravastatin (elevated CPK), also previously took ezetimibe-simvastatin Risk Factors: CAD, T2DM,  LDL-C goal: <55 mg/dL  ApoB goal: <40 mg/dL   Diet: Eats many  fatty/sugary foods - likes carbs, ice cream, cheese, bread, potatoes Does not typically eat breakfast- usually just has hot chocolate drink (sugar in  hot chocolate mix), eats lunch and dinner Denies drinking sugary beverages- has some diet sodas or flavors water with sugar free packets  Exercise: Regular walking - s/p TKA. Walks 8-12 miles at time, 3-4 times a week.   Family History: Stroke and MI (40) in his father.   Social History: Rare alcohol intake. Never smoker.   Labs: Lipid Panel     Component Value Date/Time   CHOL 140 03/18/2023 0934   TRIG 245.0 (H) 03/18/2023 0934   HDL 42.70 03/18/2023 0934   CHOLHDL 3 03/18/2023 0934   VLDL 49.0 (H) 03/18/2023 0934   LDLCALC 64 07/24/2020 0809   LDLDIRECT 81.0 03/18/2023 0934    Past Medical History:  Diagnosis Date   Arthritis    BURSITIS, LEFT HIP 05/20/2010   Claustrophobia    "take Lorazepam if I have to fly"   DIAB W/O COMP TYPE II/UNS NOT STATED UNCNTRL 03/30/2009   ELEVATED BLOOD PRESSURE 03/30/2009   Fissure, anal    occ bleeds still   GERD (gastroesophageal reflux disease)    Headache(784.0)    "only when I was working; had headaches and migraines"   History of hiatal hernia    History of kidney stones    has had 17 stones, still has one   Hyperlipidemia 10/11/2010   Insomnia    Irregular heart beat    Numbness and tingling of both feet    Pancreatic carcinoma (HCC) 2010   SHOULDER PAIN, LEFT  10/20/2008   Skin cancer     Current Outpatient Medications on File Prior to Visit  Medication Sig Dispense Refill   aspirin EC 81 MG tablet Take 81 mg by mouth daily with supper. Swallow whole.     celecoxib (CELEBREX) 200 MG capsule Take 1 capsule (200 mg total) by mouth 2 (two) times daily. 180 capsule 3   cyclobenzaprine (FLEXERIL) 10 MG tablet TAKE ONE TABLET BY MOUTH EVERY 8 HOURS AS NEEDED FOR MUSCLE SPASMS 90 tablet 0   Daridorexant HCl (QUVIVIQ) 50 MG TABS TAKE ONE TABLET BY MOUTH AT BEDTIME AS NEEDED 30 tablet 5   diphenhydrAMINE (BENADRYL) 25 MG tablet Take 75 mg by mouth every 6 (six) hours as needed for allergies.     empagliflozin (JARDIANCE) 10 MG  TABS tablet Take 1 tablet (10 mg total) by mouth daily. 90 tablet 3   gabapentin (NEURONTIN) 100 MG capsule Take 1 capsule (100 mg total) by mouth 3 (three) times daily. 270 capsule 3   LORazepam (ATIVAN) 1 MG tablet TAKE ONE TABLET BY MOUTH TWICE A DAY AS NEEDED FOR ANXIETY 30 tablet 0   metFORMIN (GLUCOPHAGE-XR) 500 MG 24 hr tablet Take 2 tablets (1,000 mg total) by mouth 2 (two) times daily. 360 tablet 3   pantoprazole (PROTONIX) 40 MG tablet TAKE 1 TABLET BY MOUTH DAILY 90 tablet 3   potassium citrate (UROCIT-K) 10 MEQ (1080 MG) SR tablet TAKE TWO TABLETS BY MOUTH EVERY MORNING AND TAKE TWO TABLETS BY MOUTH EVERY NIGHT AT BEDTIME 360 tablet 1   rosuvastatin (CRESTOR) 20 MG tablet Take 1 tablet (20 mg total) by mouth daily. 90 tablet 3   blood glucose meter kit and supplies KIT Use as directed 1 each 0   glucose blood (CONTOUR TEST) test strip Use test strips twice daily as needed to check blood sugars 100 each 12   Lancet Device MISC Contour next test lancets.  Test once daily.  Dx E11.9 100 each 3   Multiple Vitamins-Minerals (CENTRUM PO) Take 1 tablet by mouth daily.     No current facility-administered medications on file prior to visit.    Allergies  Allergen Reactions   Merthiolate [Thimerosol]     Unknown reaction   Morphine     REACTION: paronia   Nickel Dermatitis   Pravachol [Pravastatin]     HIGH CPK    Trazodone     Makes jittery    Assessment/Plan:  1. Hyperlipidemia -  Hyperlipidemia Assessment - Pt with direct LDL-C of 81 mg/dL uncontrolled above goal <55 mg/dL given Z6XW and known CAD. TG elevated > 150 mg/dL. - Pt is a great candidate for PCSK9i, Repatha (evolocumab), to reduce CV risk and potentially contribute to plaque regression. Pt also qualifies for treatment with Vascepa (icosapent ethyl) to lower CV risk given T2DM, CAD, and TG > 150.  - Pt BG is controlled - do not suspect hyperglycemia is contributing to hypertriglyceridemia at this time. Pt may be a  good candidate for GLP-1RA or GLP-1/GIP RA with BMI 34. Did have pancreatic neuroendocrine tumor removed in 2010 - did not have pancreatitis per pt. Will encourage pt to discuss GLP-1 with PCP.   Plan - Will submit PA for Repatha and Vascepa, if approved:  - Start Repatha 140 mg subcutaneous every 2 weeks  - Start Vascepa 2g PO BID - Discussed administration and potential AE with above lipid-lowering therapies - Obtain repeat lipid panel 2-3 months after starting new medications  Thank you,  Lou Cal  Caryn Section, PharmD PGY1 Pharmacy Resident  Olene Floss, Pharm.D, BCACP, BCPS, CPP [facility] [address], [place], [place] [address]  Phone: [phone]; Fax: [phone]

## 2023-06-19 NOTE — Assessment & Plan Note (Signed)
Assessment - Pt with direct LDL-C of 81 mg/dL uncontrolled above goal <55 mg/dL given H0QM and known CAD. TG elevated > 150 mg/dL. - Pt is a great candidate for PCSK9i, Repatha (evolocumab), to reduce CV risk and potentially contribute to plaque regression. Pt also qualifies for treatment with Vascepa (icosapent ethyl) to lower CV risk given T2DM, CAD, and TG > 150.  - Pt BG is controlled - do not suspect hyperglycemia is contributing to hypertriglyceridemia at this time. Pt may be a good candidate for GLP-1RA or GLP-1/GIP RA with BMI 34. Did have pancreatic neuroendocrine tumor removed in 2010 - did not have pancreatitis per pt. Will encourage pt to discuss GLP-1 with PCP.   Plan - Will submit PA for Repatha and Vascepa, if approved:  - Start Repatha 140 mg subcutaneous every 2 weeks  - Start Vascepa 2g PO BID - Discussed administration and potential AE with above lipid-lowering therapies - Obtain repeat lipid panel 2-3 months after starting new medications

## 2023-06-19 NOTE — Telephone Encounter (Signed)
Contacted patient to inform of $0 copay for Repatha and Vascepa. No PA required through insurance.   Recommended that patient fill one month supply of medications, then 3 mo supply before the end of the year to take advantage of $0 copay prior to restarting deductible Jan 1.   Reminded patient to return for repeat lipid panel in 2-3 mo.  Nils Pyle, PharmD PGY1 Pharmacy Resident

## 2023-06-28 ENCOUNTER — Encounter: Payer: Self-pay | Admitting: Cardiology

## 2023-07-01 ENCOUNTER — Telehealth: Payer: Self-pay | Admitting: *Deleted

## 2023-07-01 DIAGNOSIS — R599 Enlarged lymph nodes, unspecified: Secondary | ICD-10-CM

## 2023-07-01 NOTE — Telephone Encounter (Signed)
Per radiology recommendations we will order a CT of the chest with contrast to evaluate adenopathy.   Pt is aware of results and need for CT of chest to follow up.  Order placed and pt to be contacted to be scheduled.

## 2023-07-07 ENCOUNTER — Encounter: Payer: Self-pay | Admitting: Family Medicine

## 2023-07-08 ENCOUNTER — Telehealth: Payer: Self-pay | Admitting: Pharmacist

## 2023-07-08 MED ORDER — ICOSAPENT ETHYL 1 G PO CAPS: 2.0000 g | ORAL_CAPSULE | Freq: Two times a day (BID) | ORAL | 3 refills | Status: AC

## 2023-07-08 MED ORDER — REPATHA SURECLICK 140 MG/ML ~~LOC~~ SOAJ: 140.0000 mg | SUBCUTANEOUS | 3 refills | Status: DC

## 2023-07-08 NOTE — Telephone Encounter (Signed)
Patient had left voice message requesting refill on Repatha and Vascepa. He wants [place] to send refills to his new pharmacy -[facility]

## 2023-07-09 MED ORDER — CYCLOBENZAPRINE HCL 10 MG PO TABS: ORAL_TABLET | ORAL | 0 refills | Status: DC

## 2023-07-09 MED ORDER — LORAZEPAM 1 MG PO TABS: ORAL_TABLET | ORAL | 0 refills | Status: DC

## 2023-07-14 ENCOUNTER — Other Ambulatory Visit (HOSPITAL_COMMUNITY): Payer: No Typology Code available for payment source

## 2023-07-14 DIAGNOSIS — M1711 Unilateral primary osteoarthritis, right knee: Secondary | ICD-10-CM | POA: Diagnosis not present

## 2023-07-15 ENCOUNTER — Other Ambulatory Visit (HOSPITAL_COMMUNITY): Payer: No Typology Code available for payment source

## 2023-07-15 ENCOUNTER — Ambulatory Visit (HOSPITAL_COMMUNITY)
Admission: RE | Admit: 2023-07-15 | Discharge: 2023-07-15 | Disposition: A | Discharge status: Home / Self Care | Payer: BC Managed Care – PPO | Source: Ambulatory Visit | Attending: Cardiology | Admitting: Cardiology

## 2023-07-15 DIAGNOSIS — I700 Atherosclerosis of aorta: Secondary | ICD-10-CM | POA: Diagnosis not present

## 2023-07-15 DIAGNOSIS — R599 Enlarged lymph nodes, unspecified: Secondary | ICD-10-CM | POA: Diagnosis not present

## 2023-07-15 LAB — POCT I-STAT CREATININE: Creatinine, Ser: 0.8 mg/dL (ref 0.61–1.24)

## 2023-07-15 MED ORDER — IOHEXOL 350 MG/ML SOLN
50.0000 mL | Freq: Once | INTRAVENOUS | Status: AC | PRN
  Administered 2023-07-15: 50 mL via INTRAVENOUS

## 2023-07-17 ENCOUNTER — Encounter: Payer: Self-pay | Admitting: Pharmacist

## 2023-07-17 ENCOUNTER — Telehealth: Payer: Self-pay | Admitting: Pharmacist

## 2023-07-17 NOTE — Telephone Encounter (Signed)
Patient left VM stating that Vascepa needs PA

## 2023-07-20 ENCOUNTER — Other Ambulatory Visit (HOSPITAL_COMMUNITY): Payer: Self-pay

## 2023-07-20 ENCOUNTER — Telehealth: Payer: Self-pay | Admitting: Pharmacy Technician

## 2023-07-20 NOTE — Telephone Encounter (Addendum)
Pharmacy Patient Advocate Encounter   Received notification from Pt Calls Messages that prior authorization for vascepa is required/requested.   Insurance verification completed.   The patient is insured through [employer] .   Per test claim: PA required; PA submitted to above mentioned insurance via Prompt PA Key/confirmation #/[id number] Status is pending

## 2023-07-20 NOTE — Telephone Encounter (Signed)
Pharmacy Patient Advocate Encounter  Received notification from [employer] that Prior Authorization for vascepa has been APPROVED from 07/20/23 to 07/18/24. Ran test claim, Copay is $0.00- 3 months. This test claim was processed through [facility]- copay amounts may vary at other pharmacies due to pharmacy/plan contracts, or as the patient moves through the different stages of their insurance plan.   PA #/Case ID/Reference #: [id number]

## 2023-07-23 ENCOUNTER — Encounter: Payer: Self-pay | Admitting: Family Medicine

## 2023-07-24 ENCOUNTER — Encounter: Payer: Self-pay | Admitting: Cardiology

## 2023-07-24 MED ORDER — QUVIVIQ 50 MG PO TABS: 1.0000 | ORAL_TABLET | Freq: Every evening | ORAL | 5 refills | Status: DC | PRN

## 2023-07-30 ENCOUNTER — Telehealth: Payer: Self-pay

## 2023-07-30 NOTE — Telephone Encounter (Signed)
Copied from CRM [id number]. Topic: Clinical - Medical Advice >> Jul 30, 2023 10:19 AM Elizebeth Brooking wrote: Reason for CRM: Patient is requesting a callback, needs insurance prior authorization for [employer]. Would like for the nurse to assist him with this

## 2023-08-03 ENCOUNTER — Telehealth: Payer: Self-pay

## 2023-08-03 ENCOUNTER — Other Ambulatory Visit (HOSPITAL_COMMUNITY): Payer: Self-pay

## 2023-08-03 NOTE — Telephone Encounter (Signed)
Noted  

## 2023-08-03 NOTE — Telephone Encounter (Signed)
Patient aware of approval.

## 2023-08-03 NOTE — Telephone Encounter (Signed)
Patient needs PA for Daridorexant HCl (QUVIVIQ) 50 MG TABS

## 2023-08-03 NOTE — Telephone Encounter (Signed)
PA request has been Submitted. New Encounter created for follow up. For additional info see Pharmacy Prior Auth telephone encounter from 08/03/23.

## 2023-08-03 NOTE — Telephone Encounter (Signed)
Pharmacy Patient Advocate Encounter  Received notification from [employer] that Prior Authorization for QUVIVIQ 50 MG TABLET  has been APPROVED from 08/03/23 to 08/01/25. Ran test claim, Copay is $94.67. This test claim was processed through [facility]- copay amounts may vary at other pharmacies due to pharmacy/plan contracts, or as the patient moves through the different stages of their insurance plan.   PA #/Case ID/Reference #: [id number]

## 2023-08-03 NOTE — Telephone Encounter (Signed)
Pharmacy Patient Advocate Encounter   Received notification from Pt Calls Messages that prior authorization for QUVIVIQ 50 MG TABLET is required/requested.   Insurance verification completed.   The patient is insured through [facility] .   Per test claim: PA required; PA submitted to above mentioned insurance via Prompt PA Key/confirmation #/EOC [id number] Status is pending

## 2023-08-10 ENCOUNTER — Encounter: Payer: Self-pay | Admitting: Cardiology

## 2023-08-13 ENCOUNTER — Other Ambulatory Visit: Payer: Self-pay | Admitting: Family Medicine

## 2023-08-15 ENCOUNTER — Encounter: Payer: Self-pay | Admitting: Family Medicine

## 2023-08-17 MED ORDER — POTASSIUM CITRATE ER 10 MEQ (1080 MG) PO TBCR: EXTENDED_RELEASE_TABLET | ORAL | 1 refills | Status: DC

## 2023-08-17 MED ORDER — CYCLOBENZAPRINE HCL 10 MG PO TABS: ORAL_TABLET | ORAL | 0 refills | Status: DC

## 2023-08-19 LAB — HM DIABETES EYE EXAM

## 2023-08-25 ENCOUNTER — Telehealth: Payer: Self-pay | Admitting: Family Medicine

## 2023-08-25 DIAGNOSIS — K869 Disease of pancreas, unspecified: Secondary | ICD-10-CM

## 2023-08-25 NOTE — Telephone Encounter (Signed)
Copied from CRM [id number]. Topic: Referral - Status >> Aug 25, 2023  8:53 AM Almira Coaster wrote: Reason for CRM: Patient is calling to follow up on a referral request he submitted about a week ago for [facility]. He would like to speak with someone if possible to get a status update.

## 2023-08-26 NOTE — Telephone Encounter (Signed)
Referral placed and patient aware 

## 2023-08-26 NOTE — Addendum Note (Signed)
Addended by: Christy Sartorius on: 08/26/2023 07:28 AM   Modules accepted: Orders

## 2023-08-27 DIAGNOSIS — K862 Cyst of pancreas: Secondary | ICD-10-CM | POA: Diagnosis not present

## 2023-09-12 ENCOUNTER — Other Ambulatory Visit: Payer: Self-pay | Admitting: Family Medicine

## 2023-09-15 ENCOUNTER — Encounter: Payer: Self-pay | Admitting: Pharmacist

## 2023-09-22 DIAGNOSIS — E785 Hyperlipidemia, unspecified: Secondary | ICD-10-CM | POA: Diagnosis not present

## 2023-09-23 LAB — LIPID PANEL
Chol/HDL Ratio: 2.7 {ratio} (ref 0.0–5.0)
Cholesterol, Total: 98 mg/dL — ABNORMAL LOW (ref 100–199)
HDL: 36 mg/dL — ABNORMAL LOW (ref >39)
LDL Chol Calc (NIH): 16 mg/dL (ref 0–99)
Triglycerides: 325 mg/dL — ABNORMAL HIGH (ref 0–149)
VLDL Cholesterol Cal: 46 mg/dL — ABNORMAL HIGH (ref 5–40)

## 2023-09-24 ENCOUNTER — Encounter: Payer: Self-pay | Admitting: Cardiology

## 2023-10-01 ENCOUNTER — Other Ambulatory Visit: Payer: Self-pay | Admitting: Family Medicine

## 2023-11-02 ENCOUNTER — Encounter: Payer: Self-pay | Admitting: Family Medicine

## 2023-11-11 ENCOUNTER — Other Ambulatory Visit: Payer: Self-pay | Admitting: Family Medicine

## 2023-12-01 DIAGNOSIS — Z1283 Encounter for screening for malignant neoplasm of skin: Secondary | ICD-10-CM | POA: Diagnosis not present

## 2023-12-01 DIAGNOSIS — X32XXXD Exposure to sunlight, subsequent encounter: Secondary | ICD-10-CM | POA: Diagnosis not present

## 2023-12-01 DIAGNOSIS — L570 Actinic keratosis: Secondary | ICD-10-CM | POA: Diagnosis not present

## 2023-12-01 DIAGNOSIS — D225 Melanocytic nevi of trunk: Secondary | ICD-10-CM | POA: Diagnosis not present

## 2023-12-08 ENCOUNTER — Other Ambulatory Visit: Payer: Self-pay | Admitting: Family Medicine

## 2023-12-10 ENCOUNTER — Other Ambulatory Visit: Payer: Self-pay | Admitting: Family Medicine

## 2023-12-28 ENCOUNTER — Other Ambulatory Visit: Payer: Self-pay | Admitting: Family Medicine

## 2024-01-06 ENCOUNTER — Encounter: Payer: Self-pay | Admitting: Family Medicine

## 2024-01-06 ENCOUNTER — Ambulatory Visit (INDEPENDENT_AMBULATORY_CARE_PROVIDER_SITE_OTHER): Admitting: Family Medicine

## 2024-01-06 VITALS — BP 110/70 | HR 71 | Temp 97.8°F | Wt 267.5 lb

## 2024-01-06 DIAGNOSIS — Z7984 Long term (current) use of oral hypoglycemic drugs: Secondary | ICD-10-CM

## 2024-01-06 DIAGNOSIS — E119 Type 2 diabetes mellitus without complications: Secondary | ICD-10-CM | POA: Diagnosis not present

## 2024-01-06 DIAGNOSIS — E785 Hyperlipidemia, unspecified: Secondary | ICD-10-CM

## 2024-01-06 DIAGNOSIS — K219 Gastro-esophageal reflux disease without esophagitis: Secondary | ICD-10-CM | POA: Diagnosis not present

## 2024-01-06 LAB — POCT GLYCOSYLATED HEMOGLOBIN (HGB A1C): Hemoglobin A1C: 7.7 % — AB (ref 4.0–5.6)

## 2024-01-06 MED ORDER — TIRZEPATIDE 2.5 MG/0.5ML ~~LOC~~ SOAJ: 2.5000 mg | SUBCUTANEOUS | 1 refills | Status: DC

## 2024-01-06 NOTE — Progress Notes (Signed)
 Established Patient Office Visit  Subjective   Patient ID: [name], male    DOB: Aug 04, 1961  Age: 63 y.o. MRN: [id number]  Chief Complaint  Patient presents with   Medical Management of Chronic Issues    HPI   [name] is seen for medical follow-up.  Had CT morphology study last November with coronary calcium  score of 653 which is 91st percentile for age and gender.  Mild nonobstructive CAD.  On Crestor  and had addition of Repatha .  He has Vascepa  on his med list but this he states he is not taking Recent follow-up lipids were much improved.  He does have type 2 diabetes currently treated with metformin  and Jardiance .  Last A1c 6.6%.  He has been doing some writing on electric bicycle.  Relatively low level exercise.  Unable to do much walking secondary to severe arthritis.  Not monitoring blood sugars regularly.  On recent CT morphology study as above had anterior mediastinal adenopathy with a node that was 2 x 1 cm.  Recommendation was for CT chest with contrast.  This study showed small cystic lesion mid body of the pancreas measuring 11 mm slowly increasing since 2021.  He had prior history of benign pancreatic cyst in the past.  Currently followed by general surgery for this.  Has had some vague intermittent sharp upper abdominal pains which she thinks is more musculoskeletal.  No nausea or vomiting.  Appetite is good.  Other chronic problems include history of GERD, type 2 diabetes, osteoarthritis involving multiple sites, chronic insomnia, dyslipidemia  Past Medical History:  Diagnosis Date   Arthritis    BURSITIS, LEFT HIP 05/20/2010   Claustrophobia    "take Lorazepam  if I have to fly"   DIAB W/O COMP TYPE II/UNS NOT STATED UNCNTRL 03/30/2009   ELEVATED BLOOD PRESSURE 03/30/2009   Fissure, anal    occ bleeds still   GERD (gastroesophageal reflux disease)    Headache(784.0)    "only when I was working; had headaches and migraines"   History of hiatal hernia    History  of kidney stones    has had 17 stones, still has one   Hyperlipidemia 10/11/2010   Insomnia    Irregular heart beat    Numbness and tingling of both feet    Pancreatic carcinoma (HCC) 2010   SHOULDER PAIN, LEFT 10/20/2008   Skin cancer    Past Surgical History:  Procedure Laterality Date   ANAL FISSURE REPAIR  07/28/2011   Procedure: ANAL FISSURE REPAIR;  Surgeon: Levert Ready, MD;  Location: [facility];  Service: General;  Laterality: N/A;  Repair Anal Fissure/sphinterotomy and excision rectal polypl   BACK SURGERY  2000   "had 2 cages put in"   BIOPSY PANCREAS  2010   benign   CYSTOSCOPY/URETEROSCOPY/HOLMIUM LASER/STENT PLACEMENT Left 07/04/2020   Procedure: CYSTOSCOPY LEFT RETROGRADE URETEROSCOPY/HOLMIUM LASER/STENT PLACEMENT;  Surgeon: Andrez Banker, MD;  Location: [facility];  Service: Urology;  Laterality: Left;   CYSTOSCOPY/URETEROSCOPY/HOLMIUM LASER/STENT PLACEMENT Bilateral 07/18/2020   Procedure: BILATERAL URETEROSCOPY BILATERAL RETROGRADE PYELOGRAM HOLMIUM LASER/STENT PLACEMENT;  Surgeon: Andrez Banker, MD;  Location: [facility];  Service: Urology;  Laterality: Bilateral;   HERNIA REPAIR  2001   umbilical   KIDNEY STONES  2010 & 2008 & 2005   LAPAROSCOPIC INCISIONAL / UMBILICAL / VENTRAL HERNIA REPAIR  07/08/11   VHR   left rotator cuff  02/26/2011   left shoulder bone spurs  2011   LIPOMA EXCISION     "  several; off back, arms"   pancreatic tumor  2010   RADIOLOGY WITH ANESTHESIA N/A 06/11/2021   Procedure: MRI LUMBAR SPINE WITHOUT CONTRAST WITH ANESTHESIA;  Surgeon: Radiologist, Medication, MD;  Location: [facility];  Service: Radiology;  Laterality: N/A;   right rotator cuff     2011   TONSILLECTOMY  1967   TOTAL KNEE ARTHROPLASTY Left 04/28/2017   Procedure: LEFT TOTAL KNEE ARTHROPLASTY;  Surgeon: Claiborne Crew, MD;  [facility];  Service: Orthopedics;  Laterality: Left;   VASECTOMY  1994   VENTRAL HERNIA REPAIR  07/08/2011   Procedure: LAPAROSCOPIC VENTRAL  HERNIA;  Surgeon: Levert Ready, MD;  Location: [facility];  Service: General;  Laterality: N/A;  Laparoscopic repair ventral hernia with mesh, Lysis of adhesions.    reports that he has never smoked. He has never used smokeless tobacco. He reports that he does not drink alcohol and does not use drugs. family history includes Cancer in his maternal grandfather; Colon cancer (age of onset: 51) in his maternal grandfather; Diabetes in his brother; Heart attack (age of onset: 70) in his father; Heart disease in his father; Stroke in his father. Allergies  Allergen Reactions   Merthiolate [Thimerosol]     Unknown reaction   Morphine      REACTION: paronia   Nickel Dermatitis   Pravachol [Pravastatin]     HIGH CPK    Trazodone      Makes jittery     Review of Systems  Constitutional:  Negative for chills and fever.  Respiratory:  Negative for shortness of breath.   Cardiovascular:  Negative for chest pain.  Gastrointestinal:  Negative for nausea and vomiting.  Musculoskeletal:  Positive for joint pain.      Objective:      BP 110/70 (BP Location: Left Arm, Patient Position: Sitting, Cuff Size: Large)   Pulse 71   Temp 97.8 F (36.6 C) (Oral)   Wt 267 lb 8 oz (121.3 kg)   SpO2 95%   BMI 34.34 kg/m  BP Readings from Last 3 Encounters:  01/06/24 110/70  06/03/23 128/85  05/19/23 120/76   Wt Readings from Last 3 Encounters:  01/06/24 267 lb 8 oz (121.3 kg)  05/19/23 265 lb (120.2 kg)  03/18/23 263 lb 12.8 oz (119.7 kg)      Physical Exam Vitals reviewed.  Constitutional:      General: He is not in acute distress.    Appearance: He is not ill-appearing.  Cardiovascular:     Rate and Rhythm: Normal rate and regular rhythm.     Comments: Both feet are warm to touch.  He has 2+ dorsalis pedis pulse and posterior tibial on the right and 1+ on the left.  Good capillary refill bilaterally. Pulmonary:     Effort: Pulmonary effort is normal.     Breath sounds: Normal breath  sounds. No wheezing or rales.  Musculoskeletal:     Right lower leg: No edema.     Left lower leg: No edema.  Neurological:     Mental Status: He is alert.      Results for orders placed or performed in visit on 01/06/24  POC HgB A1c  Result Value Ref Range   Hemoglobin A1C 7.7 (A) 4.0 - 5.6 %   HbA1c POC (<> result, manual entry)     HbA1c, POC (prediabetic range)     HbA1c, POC (controlled diabetic range)         The ASCVD Risk score (Arnett DK, et al.,  2019) failed to calculate for the following reasons:   The valid total cholesterol range is 130 to 320 mg/dL    Assessment & Plan:   #1 type 2 diabetes suboptimally controlled with A1c today 7.7%.  He is currently on metformin  and Jardiance .  He continues to struggle with weight conttrol.  Somewhat limited in exercise.  He specifically inquires about GLP-1 medications.  He has no history of pancreatitis.  No family history of medullary thyroid  cancer.  He does have recently noted pancreatic cyst but no history of pancreatitis.  We discussed GLP-1 medications and he would like to try.  Consider Mounjaro 2.5 mg subcutaneous once weekly.  If tolerating well would plan a 1 month to titrate to 5 mg once weekly.  Set up 43-month follow-up and reassess A1c then  #2 dyslipidemia.  Patient currently on Crestor  and Repatha .  Has had some persistent elevated triglycerides.  Has Vascepa  on med list but currently not taking.   Hopefully, hypertriglyceridemia will improve some with better glycemic control.  #3 history of chronic insomnia.  Currently doing well with Quviviq .   #4 GERD controlled with pantoprazole  40 mg daily.   Return in about 3 months (around 04/07/2024).    Glean Lamy, MD

## 2024-01-06 NOTE — Patient Instructions (Signed)
 A1C today increased to 7.7%.   Give me feedback in one month regarding the Moujaro tolerance and we will plan to increase to 5 mg.   Let's plan on 3 month office follow up to recheck A1C

## 2024-01-07 ENCOUNTER — Telehealth: Payer: Self-pay | Admitting: *Deleted

## 2024-01-07 ENCOUNTER — Other Ambulatory Visit (HOSPITAL_COMMUNITY): Payer: Self-pay

## 2024-01-07 ENCOUNTER — Telehealth: Payer: Self-pay

## 2024-01-07 NOTE — Telephone Encounter (Signed)
 Pharmacy Patient Advocate Encounter  Received notification from [employer] [facility] that Prior Authorization for Mounjaro 2.5 has been doesn't require a prior authorization. Patient has cost exceeds limit on medication and must be overridden at pharmacy. Spoke with Janine at [employer] and awaiting authorization fax.   PA #/Case ID/Reference #: [id number]

## 2024-01-07 NOTE — Telephone Encounter (Signed)
 Copied from CRM [id number]. Topic: Clinical - Medication Prior Auth >> Jan 07, 2024 10:18 AM Alysia Jumbo S wrote: Reason for CRM: Myesha with [employer] requesting a prior authorization for tirzepatide (MOUNJARO) 2.5 MG/0.5ML Pen. States it can be faxed to [phone] or completed via online portal.  Callback # [phone]

## 2024-01-07 NOTE — Telephone Encounter (Signed)
 Noted

## 2024-01-10 ENCOUNTER — Other Ambulatory Visit: Payer: Self-pay | Admitting: Family Medicine

## 2024-01-14 ENCOUNTER — Telehealth: Payer: Self-pay | Admitting: *Deleted

## 2024-01-14 NOTE — Telephone Encounter (Signed)
 Copied from CRM [id number]. Topic: Clinical - Medication Prior Auth >> Jan 07, 2024 10:18 AM Alysia Jumbo S wrote: Reason for CRM: Myesha with [employer] requesting a prior authorization for tirzepatide  (MOUNJARO ) 2.5 MG/0.5ML Pen. States it can be faxed to [phone] or completed via online portal.  Callback # [phone] >> Jan 14, 2024 10:35 AM Howard Macho wrote: Renay Carota from rx benefit called stating the patient need a prior authorization for [facility]. Renay Carota stated they have reached out a few times and they are not getting a response back. Renay Carota stated the prior authorization need to be submitted to [email or web address] under member ID# [id number] and could it be marked urgent if the patient need it urgent  CB [phone]

## 2024-01-18 ENCOUNTER — Other Ambulatory Visit (HOSPITAL_COMMUNITY): Payer: Self-pay

## 2024-01-18 ENCOUNTER — Telehealth: Payer: Self-pay

## 2024-01-18 NOTE — Telephone Encounter (Signed)
 Noted

## 2024-01-18 NOTE — Telephone Encounter (Signed)
 Pharmacy Patient Advocate Encounter  Received notification from [employer] [facility] that Prior Authorization for Mounjaro  2.5MG /0.5ML auto-injectors  has been APPROVED from 01/18/24 to 01/16/25. Ran test claim, Copay is $0. This test claim was processed through [facility]- copay amounts may vary at other pharmacies due to pharmacy/plan contracts, or as the patient moves through the different stages of their insurance plan.   PA #/Case ID/Reference #: [id number]

## 2024-01-18 NOTE — Telephone Encounter (Signed)
 Pharmacy Patient Advocate Encounter   Received notification from Physician's Office that prior authorization for Mounjaro  is required/requested.   Insurance verification completed.   The patient is insured through [employer] .   Per test claim: PA required; PA submitted to above mentioned insurance via Prompt PA Key/confirmation #/EOC [id number] Status is pending

## 2024-02-04 ENCOUNTER — Telehealth: Payer: Self-pay | Admitting: *Deleted

## 2024-02-04 NOTE — Telephone Encounter (Signed)
 Reason for CRM: Patient called in for a message for Dr.Burchette, regarding his prescription    tirzepatide  (MOUNJARO ) 2.5 MG/0.5ML Pen , stated he is doing good on no side effects that he knows of and is ready to up the dosage

## 2024-02-05 DIAGNOSIS — R519 Headache, unspecified: Secondary | ICD-10-CM | POA: Diagnosis not present

## 2024-02-05 DIAGNOSIS — H538 Other visual disturbances: Secondary | ICD-10-CM | POA: Diagnosis not present

## 2024-02-05 DIAGNOSIS — M2578 Osteophyte, vertebrae: Secondary | ICD-10-CM | POA: Diagnosis not present

## 2024-02-05 DIAGNOSIS — I6529 Occlusion and stenosis of unspecified carotid artery: Secondary | ICD-10-CM | POA: Diagnosis not present

## 2024-02-05 DIAGNOSIS — I671 Cerebral aneurysm, nonruptured: Secondary | ICD-10-CM | POA: Diagnosis not present

## 2024-02-05 DIAGNOSIS — I6523 Occlusion and stenosis of bilateral carotid arteries: Secondary | ICD-10-CM | POA: Diagnosis not present

## 2024-02-06 ENCOUNTER — Other Ambulatory Visit: Payer: Self-pay | Admitting: Family Medicine

## 2024-02-07 ENCOUNTER — Other Ambulatory Visit: Payer: Self-pay | Admitting: Family Medicine

## 2024-02-08 MED ORDER — TIRZEPATIDE 5 MG/0.5ML ~~LOC~~ SOAJ: 5.0000 mg | SUBCUTANEOUS | 0 refills | Status: DC

## 2024-02-08 NOTE — Addendum Note (Signed)
 Addended by: CHRISTYNE IDELL LABOR on: 02/08/2024 02:34 PM   Modules accepted: Orders

## 2024-02-08 NOTE — Telephone Encounter (Signed)
 Patient informed of the message below, Rx done and a follow up appt was scheduled on 10/8.

## 2024-02-08 NOTE — Telephone Encounter (Signed)
 Refilled Lorazepam , but recommend regular use.  Wolm LELON Scarlet MD [facility] at [facility]

## 2024-02-09 ENCOUNTER — Other Ambulatory Visit: Payer: Self-pay | Admitting: Family Medicine

## 2024-03-01 ENCOUNTER — Other Ambulatory Visit: Payer: Self-pay | Admitting: Surgery

## 2024-03-01 DIAGNOSIS — K862 Cyst of pancreas: Secondary | ICD-10-CM

## 2024-03-03 ENCOUNTER — Other Ambulatory Visit: Payer: Self-pay | Admitting: Family Medicine

## 2024-03-07 ENCOUNTER — Other Ambulatory Visit: Payer: Self-pay | Admitting: Family Medicine

## 2024-03-09 ENCOUNTER — Telehealth: Payer: Self-pay

## 2024-03-09 MED ORDER — TIRZEPATIDE 7.5 MG/0.5ML ~~LOC~~ SOAJ: 7.5000 mg | SUBCUTANEOUS | 0 refills | Status: DC

## 2024-03-09 NOTE — Telephone Encounter (Signed)
 Copied from CRM #[id number]. Topic: Clinical - Medication Question >> Mar 09, 2024  9:06 AM Suzen RAMAN wrote: Reason for CRM: Patient would like to inform provider that he is tolerating the tirzepatide  (MOUNJARO ) 5 MG/0.5ML well with no side effects and would like to know if its time for an increase.   CB#[phone].

## 2024-03-09 NOTE — Addendum Note (Signed)
 Addended by: METTA KRISTEN CROME on: 03/09/2024 01:38 PM   Modules accepted: Orders

## 2024-03-09 NOTE — Telephone Encounter (Signed)
Patient informed of message below and rx sent

## 2024-03-21 ENCOUNTER — Ambulatory Visit
Admission: RE | Admit: 2024-03-21 | Discharge: 2024-03-21 | Disposition: A | Discharge status: Home / Self Care | Source: Ambulatory Visit | Attending: Surgery | Admitting: Surgery

## 2024-03-21 DIAGNOSIS — K862 Cyst of pancreas: Secondary | ICD-10-CM

## 2024-03-21 DIAGNOSIS — K7689 Other specified diseases of liver: Secondary | ICD-10-CM | POA: Diagnosis not present

## 2024-03-21 DIAGNOSIS — K8689 Other specified diseases of pancreas: Secondary | ICD-10-CM | POA: Diagnosis not present

## 2024-03-21 MED ORDER — IOPAMIDOL (ISOVUE-370) INJECTION 76%
80.0000 mL | Freq: Once | INTRAVENOUS | Status: AC | PRN
  Administered 2024-03-21: 80 mL via INTRAVENOUS

## 2024-03-22 ENCOUNTER — Encounter: Payer: Self-pay | Admitting: Family Medicine

## 2024-03-30 DIAGNOSIS — K862 Cyst of pancreas: Secondary | ICD-10-CM | POA: Diagnosis not present

## 2024-04-12 ENCOUNTER — Encounter: Payer: Self-pay | Admitting: Family Medicine

## 2024-04-12 MED ORDER — COVID-19 MRNA VACC (MODERNA) 50 MCG/0.5ML IM SUSP: 0.5000 mL | Freq: Once | INTRAMUSCULAR | 0 refills | Status: AC

## 2024-04-27 ENCOUNTER — Other Ambulatory Visit: Payer: Self-pay | Admitting: Family Medicine

## 2024-05-02 ENCOUNTER — Encounter: Payer: Self-pay | Admitting: Family Medicine

## 2024-05-02 ENCOUNTER — Other Ambulatory Visit: Payer: Self-pay | Admitting: Family Medicine

## 2024-05-02 MED ORDER — QUVIVIQ 50 MG PO TABS: 1.0000 | ORAL_TABLET | Freq: Every evening | ORAL | 5 refills | Status: DC | PRN

## 2024-05-02 NOTE — Telephone Encounter (Signed)
 I have sent new prescription into [facility] pharmacy  Wolm LELON Scarlet MD [facility] at [facility]

## 2024-05-03 ENCOUNTER — Other Ambulatory Visit: Payer: Self-pay | Admitting: Family Medicine

## 2024-05-03 NOTE — Telephone Encounter (Unsigned)
 Copied from CRM #[id number]. Topic: Clinical - Medication Refill >> May 03, 2024  9:23 AM Roselie BROCKS wrote: Medication: tirzepatide  (MOUNJARO ) 7.5 MG/0.5ML Pen  Patient is requesting for this to be increased  this time   Has the patient contacted their pharmacy? Yes (Agent: If no, request that the patient contact the pharmacy for the refill. If patient does not wish to contact the pharmacy document the reason why and proceed with request.) (Agent: If yes, when and what did the pharmacy advise?)  This is the patient's preferred pharmacy:  [id number] [place] - [place], [place] - [address] [place]. AT [address] [place] [address] Phone: [phone] Fax: [phone]   Is this the correct pharmacy for this prescription? Yes If no, delete pharmacy and type the correct one.   Has the prescription been filled recently? Yes  Is the patient out of the medication? Yes  Has the patient been seen for an appointment in the last year OR does the patient have an upcoming appointment? Yes  Can we respond through MyChart? Yes  Agent: Please be advised that Rx refills may take up to 3 business days. We ask that you follow-up with your pharmacy.

## 2024-05-04 ENCOUNTER — Other Ambulatory Visit: Payer: Self-pay | Admitting: Family Medicine

## 2024-05-04 MED ORDER — TIRZEPATIDE 10 MG/0.5ML ~~LOC~~ SOAJ: 10.0000 mg | SUBCUTANEOUS | 0 refills | Status: DC

## 2024-05-11 ENCOUNTER — Ambulatory Visit: Admitting: Family Medicine

## 2024-05-11 ENCOUNTER — Encounter: Payer: Self-pay | Admitting: Family Medicine

## 2024-05-11 VITALS — BP 110/70 | HR 68 | Temp 97.8°F | Wt 245.5 lb

## 2024-05-11 DIAGNOSIS — I2510 Atherosclerotic heart disease of native coronary artery without angina pectoris: Secondary | ICD-10-CM | POA: Diagnosis not present

## 2024-05-11 DIAGNOSIS — E119 Type 2 diabetes mellitus without complications: Secondary | ICD-10-CM | POA: Diagnosis not present

## 2024-05-11 DIAGNOSIS — Z7985 Long-term (current) use of injectable non-insulin antidiabetic drugs: Secondary | ICD-10-CM

## 2024-05-11 DIAGNOSIS — M150 Primary generalized (osteo)arthritis: Secondary | ICD-10-CM | POA: Diagnosis not present

## 2024-05-11 DIAGNOSIS — E785 Hyperlipidemia, unspecified: Secondary | ICD-10-CM | POA: Diagnosis not present

## 2024-05-11 LAB — POCT GLYCOSYLATED HEMOGLOBIN (HGB A1C): Hemoglobin A1C: 5.6 % (ref 4.0–5.6)

## 2024-05-11 MED ORDER — CELECOXIB 200 MG PO CAPS: 200.0000 mg | ORAL_CAPSULE | Freq: Two times a day (BID) | ORAL | 0 refills | Status: DC

## 2024-05-11 MED ORDER — ROSUVASTATIN CALCIUM 20 MG PO TABS
20.0000 mg | ORAL_TABLET | Freq: Every day | ORAL | 3 refills | Status: AC
Start: 2024-05-11 — End: ?

## 2024-05-11 MED ORDER — CYCLOBENZAPRINE HCL 10 MG PO TABS: ORAL_TABLET | ORAL | 0 refills | Status: DC

## 2024-05-11 MED ORDER — POTASSIUM CITRATE ER 10 MEQ (1080 MG) PO TBCR: EXTENDED_RELEASE_TABLET | ORAL | 1 refills | Status: DC

## 2024-05-11 MED ORDER — METFORMIN HCL ER 500 MG PO TB24: 1000.0000 mg | ORAL_TABLET | Freq: Two times a day (BID) | ORAL | 1 refills | Status: AC

## 2024-05-11 MED ORDER — REPATHA SURECLICK 140 MG/ML ~~LOC~~ SOAJ: 140.0000 mg | SUBCUTANEOUS | 3 refills | Status: AC

## 2024-05-11 NOTE — Patient Instructions (Signed)
 A1C greatly improved to 5.6! (Was 7.7).

## 2024-05-11 NOTE — Progress Notes (Signed)
 Established Patient Office Visit  Subjective   Patient ID: [name], male    DOB: 11/16/1960  Age: 63 y.o. MRN: [id number]  Chief Complaint  Patient presents with   Medical Management of Chronic Issues    HPI   [name] is seen for medical follow-up.  He has history of benign pancreatic tumor, GERD, type 2 diabetes, osteoarthritis multiple sites, history of kidney stones, hyperlipidemia, obesity, CAD with previous coronary calcium  score 653 2024.  He is on statin plus Repatha .  Followed through cardiology.  No recent chest pains.  Last visit we started Mounjaro  and he just recently bumped this up to 10 mg.  Tolerating well.  Lost about 27 pounds thus far.  Last A1c this past summer was 7.7 down to 5.6 today.  He is very pleased with weight loss overall.  He needs several medications refilled today including Jardiance , Celebrex , Flexeril , metformin , Crestor , Repatha , potassium citrate .  No statin intolerance.  Past Medical History:  Diagnosis Date   Arthritis    BURSITIS, LEFT HIP 05/20/2010   Claustrophobia    take Lorazepam  if I have to fly   DIAB W/O COMP TYPE II/UNS NOT STATED UNCNTRL 03/30/2009   ELEVATED BLOOD PRESSURE 03/30/2009   Fissure, anal    occ bleeds still   GERD (gastroesophageal reflux disease)    Headache(784.0)    only when I was working; had headaches and migraines   History of hiatal hernia    History of kidney stones    has had 17 stones, still has one   Hyperlipidemia 10/11/2010   Insomnia    Irregular heart beat    Numbness and tingling of both feet    Pancreatic carcinoma (HCC) 2010   SHOULDER PAIN, LEFT 10/20/2008   Skin cancer    Past Surgical History:  Procedure Laterality Date   ANAL FISSURE REPAIR  07/28/2011   Procedure: ANAL FISSURE REPAIR;  Surgeon: Elon CHRISTELLA Pacini, MD;  [facility]: [facility];  Service: General;  Laterality: N/A;  Repair Anal Fissure/sphinterotomy and excision rectal polypl   BACK SURGERY  2000   had 2 cages put in    BIOPSY PANCREAS  2010   benign   CYSTOSCOPY/URETEROSCOPY/HOLMIUM LASER/STENT PLACEMENT Left 07/04/2020   Procedure: CYSTOSCOPY LEFT RETROGRADE URETEROSCOPY/HOLMIUM LASER/STENT PLACEMENT;  Surgeon: Cam Morene ORN, MD;  Location: [facility];  Service: Urology;  Laterality: Left;   CYSTOSCOPY/URETEROSCOPY/HOLMIUM LASER/STENT PLACEMENT Bilateral 07/18/2020   Procedure: BILATERAL URETEROSCOPY BILATERAL RETROGRADE PYELOGRAM HOLMIUM LASER/STENT PLACEMENT;  Surgeon: Cam Morene ORN, MD;  [facility];  Service: Urology;  Laterality: Bilateral;   HERNIA REPAIR  2001   umbilical   KIDNEY STONES  2010 & 2008 & 2005   LAPAROSCOPIC INCISIONAL / UMBILICAL / VENTRAL HERNIA REPAIR  07/08/11   VHR   left rotator cuff  02/26/2011   left shoulder bone spurs  2011   LIPOMA EXCISION     several; off back, arms   pancreatic tumor  2010   RADIOLOGY WITH ANESTHESIA N/A 06/11/2021   Procedure: MRI LUMBAR SPINE WITHOUT CONTRAST WITH ANESTHESIA;  Surgeon: Radiologist, Medication, MD;  [facility]: [facility];  Service: Radiology;  Laterality: N/A;   right rotator cuff     2011   TONSILLECTOMY  1967   TOTAL KNEE ARTHROPLASTY Left 04/28/2017   Procedure: LEFT TOTAL KNEE ARTHROPLASTY;  Surgeon: Ernie Cough, MD;  [facility]: [facility];  Service: Orthopedics;  Laterality: Left;   VASECTOMY  1994   VENTRAL HERNIA REPAIR  07/08/2011   Procedure: LAPAROSCOPIC  VENTRAL HERNIA;  Surgeon: Elon CHRISTELLA Pacini, MD;  [facility];  Service: General;  Laterality: N/A;  Laparoscopic repair ventral hernia with mesh, Lysis of adhesions.    reports that he has never smoked. He has never used smokeless tobacco. He reports that he does not drink alcohol and does not use drugs. family history includes Cancer in his maternal grandfather; Colon cancer (age of onset: 74) in his maternal grandfather; Diabetes in his brother; Heart attack (age of onset: 23) in his father; Heart disease in his father; Stroke in his father. Allergies  Allergen  Reactions   Merthiolate [Thimerosol]     Unknown reaction   Morphine      REACTION: paronia   Nickel Dermatitis   Pravachol [Pravastatin]     HIGH CPK    Trazodone      Makes jittery    Review of Systems  Constitutional:  Negative for malaise/fatigue.  Eyes:  Negative for blurred vision.  Respiratory:  Negative for shortness of breath.   Cardiovascular:  Negative for chest pain.  Neurological:  Negative for dizziness, weakness and headaches.      Objective:     BP 110/70   Pulse 68   Temp 97.8 F (36.6 C) (Oral)   Wt 245 lb 8 oz (111.4 kg)   SpO2 94%   BMI 31.52 kg/m  BP Readings from Last 3 Encounters:  05/11/24 110/70  01/06/24 110/70  06/03/23 128/85   Wt Readings from Last 3 Encounters:  05/11/24 245 lb 8 oz (111.4 kg)  01/06/24 267 lb 8 oz (121.3 kg)  05/19/23 265 lb (120.2 kg)      Physical Exam Vitals reviewed.  Constitutional:      General: He is not in acute distress.    Appearance: He is well-developed. He is not ill-appearing.  Eyes:     Pupils: Pupils are equal, round, and reactive to light.  Neck:     Thyroid : No thyromegaly.  Cardiovascular:     Rate and Rhythm: Normal rate and regular rhythm.  Pulmonary:     Effort: Pulmonary effort is normal. No respiratory distress.     Breath sounds: Normal breath sounds. No wheezing or rales.  Musculoskeletal:     Cervical back: Neck supple.  Neurological:     Mental Status: He is alert and oriented to person, place, and time.      Results for orders placed or performed in visit on 05/11/24  POC HgB A1c  Result Value Ref Range   Hemoglobin A1C 5.6 4.0 - 5.6 %   HbA1c POC (<> result, manual entry)     HbA1c, POC (prediabetic range)     HbA1c, POC (controlled diabetic range)      Last CBC Lab Results  Component Value Date   WBC 4.9 03/18/2023   HGB 16.2 03/18/2023   HCT 48.6 03/18/2023   MCV 90.2 03/18/2023   MCH 30.2 07/18/2020   RDW 13.0 03/18/2023   PLT 227.0 03/18/2023   Last  metabolic panel Lab Results  Component Value Date   GLUCOSE 127 (H) 05/19/2023   NA 141 05/19/2023   K 4.9 05/19/2023   CL 102 05/19/2023   CO2 23 05/19/2023   BUN 18 05/19/2023   CREATININE 0.80 07/15/2023   EGFR 104 05/19/2023   CALCIUM  9.7 05/19/2023   PROT 7.4 03/18/2023   ALBUMIN 4.7 03/18/2023   BILITOT 0.6 03/18/2023   ALKPHOS 54 03/18/2023   AST 24 03/18/2023   ALT 18 03/18/2023   ANIONGAP  9 06/11/2021   Last lipids Lab Results  Component Value Date   CHOL 98 (L) 09/22/2023   HDL 36 (L) 09/22/2023   LDLCALC 16 09/22/2023   LDLDIRECT 81.0 03/18/2023   TRIG 325 (H) 09/22/2023   CHOLHDL 2.7 09/22/2023   Last hemoglobin A1c Lab Results  Component Value Date   HGBA1C 5.6 05/11/2024      The ASCVD Risk score (Arnett DK, et al., 2019) failed to calculate for the following reasons:   The valid total cholesterol range is 130 to 320 mg/dL    Assessment & Plan:   #1 history of CAD with coronary calcium  score 653 with hyperlipidemia.  Patient on Repatha  and Celebrex .  Refilled Repatha  today along with rosuvastatin .  Check follow-up fasting lipids at 39-month follow-up if he has not had this through cardiology between now and then.  Continue low saturated fat diet.  Last LDL cholesterol was 16  #2 type 2 diabetes greatly improved with A1c reduction of 7.7-5.6 on Mounjaro .  He would like to get his BMI at least below 30.  Continue Mounjaro  currently 10 mg once weekly.  Set up 32-month follow-up.  Check urine microalbumin at follow-up.  Continue yearly diabetic eye exam.  We did discuss possible reduction in metformin  dose at follow-up if A1c's still well-controlled and particularly if continuing to improve  #3 osteoarthritis of multiple sites.  We have cautioned him about chronic NSAID use.  Current on Celebrex .  Try to reduce if possible to once daily.   Return in about 3 months (around 08/11/2024).    Wolm Scarlet, MD

## 2024-06-23 ENCOUNTER — Telehealth: Payer: Self-pay

## 2024-06-23 NOTE — Telephone Encounter (Signed)
 Copied from CRM #[id number]. Topic: Clinical - Medication Question >> Jun 23, 2024 10:11 AM Laymon HERO wrote: Reason for CRM: Pharmacy calling for patient- patient is leaving out of town on monday 24th. but unable to fill until the 26th- Pharmacy called and they said they will refill it early as long as its okay with Dr Micheal. medication is Daridorexant  HCl (QUVIVIQ ) 50 MG TABS

## 2024-06-23 NOTE — Telephone Encounter (Signed)
 I spoke with Keko at [facility] pharmacy and permission was given to refill early

## 2024-07-03 ENCOUNTER — Other Ambulatory Visit: Payer: Self-pay | Admitting: Family Medicine

## 2024-07-05 ENCOUNTER — Ambulatory Visit: Payer: Self-pay | Admitting: Family Medicine

## 2024-07-05 DIAGNOSIS — R1900 Intra-abdominal and pelvic swelling, mass and lump, unspecified site: Secondary | ICD-10-CM

## 2024-07-05 NOTE — Telephone Encounter (Signed)
 Please let patient know we are setting up CT pancreas to follow-up cystic lesion mid body of pancreas which was 11 mm by CT chest noted last 07-15-2023.  Radiologist had recommended 1 year follow-up as this had increased from 5 mm previously  Wolm LELON Scarlet MD [facility] at [facility]

## 2024-07-06 ENCOUNTER — Other Ambulatory Visit: Payer: Self-pay | Admitting: Family Medicine

## 2024-07-07 NOTE — Telephone Encounter (Signed)
 Pt LOV was 05/11/24 Last refill was done on 04/27/24 Please advise

## 2024-07-11 ENCOUNTER — Encounter: Payer: Self-pay | Admitting: Cardiology

## 2024-07-11 ENCOUNTER — Ambulatory Visit: Attending: Cardiology | Admitting: Cardiology

## 2024-07-11 VITALS — BP 120/78 | HR 71 | Ht 74.0 in | Wt 236.4 lb

## 2024-07-11 DIAGNOSIS — I25119 Atherosclerotic heart disease of native coronary artery with unspecified angina pectoris: Secondary | ICD-10-CM | POA: Diagnosis not present

## 2024-07-11 DIAGNOSIS — R0602 Shortness of breath: Secondary | ICD-10-CM

## 2024-07-11 DIAGNOSIS — E785 Hyperlipidemia, unspecified: Secondary | ICD-10-CM

## 2024-07-11 NOTE — Patient Instructions (Signed)
 Medication Instructions:  The current medical regimen is effective;  continue present plan and medications.  *If you need a refill on your cardiac medications before your next appointment, please call your pharmacy*   Follow-Up: At [facility], you and your health needs are our priority.  As part of our continuing mission to provide you with exceptional heart care, our providers are all part of one team.  This team includes your primary Cardiologist (physician) and Advanced Practice Providers or APPs (Physician Assistants and Nurse Practitioners) who all work together to provide you with the care you need, when you need it.  Your next appointment:   Follow up as needed   We recommend signing up for the patient portal called "MyChart".  Sign up information is provided on this After Visit Summary.  MyChart is used to connect with patients for Virtual Visits (Telemedicine).  Patients are able to view lab/test results, encounter notes, upcoming appointments, etc.  Non-urgent messages can be sent to your provider as well.   To learn more about what you can do with MyChart, go to [email or web address].

## 2024-07-11 NOTE — Progress Notes (Signed)
 Cardiology Office Note:  .   Date:  07/11/2024  ID:  [name], DOB 1960/08/30, MRN [id number] PCP: Micheal Wolm ORN, MD  [facility] Providers Cardiologist:  Oneil Parchment, MD     History of Present Illness: .   [name] is a 63 y.o. male Discussed the use of AI scribe   History of Present Illness [name] is a 63 year old male with coronary artery disease who presents for routine follow-up.  He has a history of coronary artery disease and underwent a coronary CT scan in October 2024, which showed mild coronary disease with a mid RCA calcium  score of 653, non-flow limiting. He is currently taking Repatha , which has significantly reduced his LDL levels to 16. His blood pressure readings have ranged from 110/70 to 120/78.  He experiences lightheadedness when bending down and standing up quickly, a symptom he has had for a couple of years. He attributes this to aging and has not been on any blood pressure medication.  He has a history of a pancreatic cyst, which has been monitored since 2021. A recent CT scan showed a small cystic lesion along the mid body of the pancreas, measuring 11 mm, which has been slowly increasing in size. He has been seen by general surgery and there is no associated pancreatic ductal dilatation.  He also has a history of fatty liver infiltration and a stable left adrenal nodule since 2021. Additionally, a small low attenuation lesion along the anterior mediastinum, possibly a benign cystic lesion, has been present since 2021 with minimal increase in size.  He has been on Mounjaro , which has helped reduce his hemoglobin A1c to 5.6 and contributed to a weight loss of 30 to 40 pounds, bringing his weight down from 270 to 230 pounds. He aims to lose an additional 25 pounds.      Studies Reviewed: SABRA   EKG Interpretation Date/Time:  Monday July 11 2024 09:55:08 EST Ventricular Rate:  71 PR Interval:  168 QRS Duration:  98 QT  Interval:  364 QTC Calculation: 395 R Axis:   -61  Text Interpretation: Normal sinus rhythm Left axis deviation When compared with ECG of 19-May-2023 09:07, QRS axis Shifted left T wave inversion no longer evident in Inferior leads Nonspecific T wave abnormality, improved in Lateral leads Confirmed by Parchment Oneil ([id number]) on 07/11/2024 10:02:34 AM    Results LABS LDL: 16 Hemoglobin A1c: 5.6  RADIOLOGY Coronary CT: Mild coronary artery disease, mid right coronary artery (RCA) with calcium  score of 653, non-flow limiting (05/2023) Chest CT with contrast: Small low attenuation lesion along the anterior mediastinum, favor benign lesion CT Pancreas: Increasing size small cystic lesion along the mid body of the pancreas 11 mm, no associated pancreatic ductal dilatation (07/24/2023)  DIAGNOSTIC Echocardiogram: Ejection fraction 60%, moderate left ventricular hypertrophy, trivial mitral regurgitation, grade one diastolic dysfunction (06/24/2023) EKG: Normal Risk Assessment/Calculations:            Physical Exam:   VS:  BP 120/78 (BP Location: Right Arm, Patient Position: Sitting, Cuff Size: Normal)   Pulse 71   Ht 6' 2 (1.88 m)   Wt 236 lb 6.4 oz (107.2 kg)   SpO2 97%   BMI 30.35 kg/m    Wt Readings from Last 3 Encounters:  07/11/24 236 lb 6.4 oz (107.2 kg)  05/11/24 245 lb 8 oz (111.4 kg)  01/06/24 267 lb 8 oz (121.3 kg)    GEN: Well nourished, well developed in no  acute distress NECK: No JVD; No carotid bruits CARDIAC: RRR, no murmurs, no rubs, no gallops RESPIRATORY:  Clear to auscultation without rales, wheezing or rhonchi  ABDOMEN: Soft, non-tender, non-distended EXTREMITIES:  No edema; No deformity   ASSESSMENT AND PLAN: .    Assessment and Plan Assessment & Plan Stable coronary artery disease Coronary artery disease is well-managed with mild coronary disease on CT scan, non-flow limiting. Ejection fraction is 60% with moderate left ventricular hypertrophy and trivial  mitral regurgitation. LDL levels are well-controlled with Repatha , reducing cardiovascular risk. No new symptoms reported. - Continue Repatha  for LDL management. - No need for annual follow-up unless new symptoms arise.  Type 2 diabetes mellitus Well-controlled with a recent hemoglobin A1c of 5.6. Significant weight loss of 30-40 pounds attributed to Mounjaro , which also benefits cardiovascular health. - Continue Mounjaro  for diabetes management.  Hypertriglyceridemia Managed with current medications. No new issues reported. - Continue current management.  Stable pancreatic cyst under surveillance Pancreatic cyst is stable with no associated pancreatic ductal dilatation. - Continue surveillance with follow-up imaging as recommended by general surgery.  Fatty liver infiltration No acute issues reported. - Continue monitoring.  Stable left adrenal nodule Left adrenal nodule is stable since 2021 with no changes noted. - Continue monitoring.  Stable anterior mediastinal cystic lesion Anterior mediastinal cystic lesion is stable since 2021 with minimal increase in size, favoring a benign lesion. - Continue monitoring.  Orthostatic lightheadedness Experiences lightheadedness upon standing, consistent with orthostatic hypotension. Blood pressure readings are within normal range. - Continue to monitor symptoms and manage as needed.         Dispo: Graduate from cardiology clinic.  Please reach out if further assistance is needed.  Signed, Oneil Parchment, MD

## 2024-07-16 ENCOUNTER — Other Ambulatory Visit: Payer: Self-pay | Admitting: Family Medicine

## 2024-07-23 ENCOUNTER — Other Ambulatory Visit: Payer: Self-pay | Admitting: Family Medicine

## 2024-07-29 ENCOUNTER — Other Ambulatory Visit: Payer: Self-pay | Admitting: Family Medicine

## 2024-08-02 ENCOUNTER — Encounter: Payer: Self-pay | Admitting: Family Medicine

## 2024-08-02 ENCOUNTER — Ambulatory Visit (INDEPENDENT_AMBULATORY_CARE_PROVIDER_SITE_OTHER): Admitting: Family Medicine

## 2024-08-02 VITALS — BP 94/66 | HR 74 | Temp 97.4°F | Wt 238.4 lb

## 2024-08-02 DIAGNOSIS — E119 Type 2 diabetes mellitus without complications: Secondary | ICD-10-CM | POA: Diagnosis not present

## 2024-08-02 DIAGNOSIS — Z7984 Long term (current) use of oral hypoglycemic drugs: Secondary | ICD-10-CM | POA: Diagnosis not present

## 2024-08-02 DIAGNOSIS — E785 Hyperlipidemia, unspecified: Secondary | ICD-10-CM

## 2024-08-02 DIAGNOSIS — Z125 Encounter for screening for malignant neoplasm of prostate: Secondary | ICD-10-CM

## 2024-08-02 NOTE — Progress Notes (Signed)
 "  Established Patient Office Visit  Subjective   Patient ID: [name], male    DOB: 04/19/1961  Age: 63 y.o. MRN: [id number]  Chief Complaint  Patient presents with   Medical Management of Chronic Issues    HPI   [name] is here for routine medical follow-up.  Generally doing well.  He is continue to have some steady weight loss since October about 7 more pounds on Mounjaro .  His wife unfortunately fell yesterday and fractured her patella.  They have pending appointment with orthopedist.  [name] has type 2 diabetes.  A1c last visit 5.6%.  Remains on metformin  and Mounjaro .  Also on Jardiance .  Does need follow-up urine microalbumin.  He is on aggressive cholesterol program of statin, Vascepa , and Repatha .  Last LDL cholesterol was 16.  He had coronary calcium  score over 600 from CT morphology study over a year ago.  No recent chest pains.  History of knee replacements.  Does some walking for exercise.  Medications reviewed.  Denies any side effects.  He has some chronic insomnia and uses Quiviviq which seems to be working well  Past Medical History:  Diagnosis Date   Arthritis    BURSITIS, LEFT HIP 05/20/2010   Claustrophobia    take Lorazepam  if I have to fly   DIAB W/O COMP TYPE II/UNS NOT STATED UNCNTRL 03/30/2009   ELEVATED BLOOD PRESSURE 03/30/2009   Fissure, anal    occ bleeds still   GERD (gastroesophageal reflux disease)    Headache(784.0)    only when I was working; had headaches and migraines   History of hiatal hernia    History of kidney stones    has had 17 stones, still has one   Hyperlipidemia 10/11/2010   Insomnia    Irregular heart beat    Numbness and tingling of both feet    Pancreatic carcinoma (HCC) 2010   SHOULDER PAIN, LEFT 10/20/2008   Skin cancer    Past Surgical History:  Procedure Laterality Date   ANAL FISSURE REPAIR  07/28/2011   Procedure: ANAL FISSURE REPAIR;  Surgeon: Elon CHRISTELLA Pacini, MD;  Location: [facility];  Service: General;   Laterality: N/A;  Repair Anal Fissure/sphinterotomy and excision rectal polypl   BACK SURGERY  2000   had 2 cages put in   BIOPSY PANCREAS  2010   benign   CYSTOSCOPY/URETEROSCOPY/HOLMIUM LASER/STENT PLACEMENT Left 07/04/2020   Procedure: CYSTOSCOPY LEFT RETROGRADE URETEROSCOPY/HOLMIUM LASER/STENT PLACEMENT;  Surgeon: Cam Morene ORN, MD;  Location: [facility];  Service: Urology;  Laterality: Left;   CYSTOSCOPY/URETEROSCOPY/HOLMIUM LASER/STENT PLACEMENT Bilateral 07/18/2020   Procedure: BILATERAL URETEROSCOPY BILATERAL RETROGRADE PYELOGRAM HOLMIUM LASER/STENT PLACEMENT;  Surgeon: Cam Morene ORN, MD;  Location: [facility];  Service: Urology;  Laterality: Bilateral;   HERNIA REPAIR  2001   umbilical   KIDNEY STONES  2010 & 2008 & 2005   LAPAROSCOPIC INCISIONAL / UMBILICAL / VENTRAL HERNIA REPAIR  07/08/11   VHR   left rotator cuff  02/26/2011   left shoulder bone spurs  2011   LIPOMA EXCISION     several; off back, arms   pancreatic tumor  2010   RADIOLOGY WITH ANESTHESIA N/A 06/11/2021   Procedure: MRI LUMBAR SPINE WITHOUT CONTRAST WITH ANESTHESIA;  Surgeon: Radiologist, Medication, MD;  Location: [facility];  Service: Radiology;  Laterality: N/A;   right rotator cuff     2011   TONSILLECTOMY  1967   TOTAL KNEE ARTHROPLASTY Left 04/28/2017   Procedure: LEFT TOTAL KNEE ARTHROPLASTY;  Surgeon:  Ernie Cough, MD;  Location: [facility];  Service: Orthopedics;  Laterality: Left;   VASECTOMY  1994   VENTRAL HERNIA REPAIR  07/08/2011   Procedure: LAPAROSCOPIC VENTRAL HERNIA;  Surgeon: Elon CHRISTELLA Pacini, MD;  Location: [facility];  Service: General;  Laterality: N/A;  Laparoscopic repair ventral hernia with mesh, Lysis of adhesions.    reports that he has never smoked. He has never used smokeless tobacco. He reports that he does not drink alcohol and does not use drugs. family history includes Cancer in his maternal grandfather; Colon cancer (age of onset: 17) in his maternal grandfather; Diabetes in his  brother; Heart attack (age of onset: 22) in his father; Heart disease in his father; Stroke in his father. Allergies[1]  Review of Systems  Constitutional:  Negative for malaise/fatigue.  Eyes:  Negative for blurred vision.  Respiratory:  Negative for shortness of breath.   Cardiovascular:  Negative for chest pain.  Neurological:  Negative for dizziness, weakness and headaches.      Objective:     BP 94/66   Pulse 74   Temp (!) 97.4 F (36.3 C) (Oral)   Wt 238 lb 6.4 oz (108.1 kg)   SpO2 96%   BMI 30.61 kg/m  BP Readings from Last 3 Encounters:  08/02/24 94/66  07/11/24 120/78  05/11/24 110/70   Wt Readings from Last 3 Encounters:  08/02/24 238 lb 6.4 oz (108.1 kg)  07/11/24 236 lb 6.4 oz (107.2 kg)  05/11/24 245 lb 8 oz (111.4 kg)      Physical Exam Vitals reviewed.  Constitutional:      General: He is not in acute distress.    Appearance: He is well-developed. He is not ill-appearing.  Eyes:     Pupils: Pupils are equal, round, and reactive to light.  Neck:     Thyroid : No thyromegaly.  Cardiovascular:     Rate and Rhythm: Normal rate and regular rhythm.  Pulmonary:     Effort: Pulmonary effort is normal. No respiratory distress.     Breath sounds: Normal breath sounds. No wheezing or rales.  Musculoskeletal:     Cervical back: Neck supple.  Neurological:     Mental Status: He is alert and oriented to person, place, and time.      No results found for any visits on 08/02/24.    The ASCVD Risk score (Arnett DK, et al., 2019) failed to calculate for the following reasons:   The valid total cholesterol range is 130 to 320 mg/dL    Assessment & Plan:   #1 type 2 diabetes controlled with recent A1c less than 3 months ago 5.6%.  Recommend urine microalbumin with 64-month follow-up labs.  Recheck A1c then.  We did suggest he go ahead and drop his metformin  back to half dosage.  Currently takes extended release 1000 mg daily.  If A1c stable in 3 months with  reduced dosage of metformin  may be able to discontinue metformin  altogether  #2 hyperlipidemia.  History of nonobstructive CAD.  Continue aggressive antilipid therapy with Repatha  and rosuvastatin .  Recheck fasting lipid and CMP in 3 months.  Continue low saturated fat diet  #3 patient requesting prostate cancer screening.  PSA added to future labs   Return in about 6 months (around 01/31/2025).    Wolm Scarlet, MD     [1]  Allergies Allergen Reactions   Merthiolate [Thimerosol]     Unknown reaction   Morphine      REACTION: paronia   Nickel Dermatitis  Pravachol [Pravastatin]     HIGH CPK    Trazodone      Makes jittery   "

## 2024-08-02 NOTE — Patient Instructions (Signed)
 Set up repeat labs for late February or early March.

## 2024-08-08 ENCOUNTER — Other Ambulatory Visit: Payer: Self-pay

## 2024-08-08 MED ORDER — POTASSIUM CITRATE ER 10 MEQ (1080 MG) PO TBCR: EXTENDED_RELEASE_TABLET | ORAL | 1 refills | Status: AC

## 2024-08-18 ENCOUNTER — Other Ambulatory Visit: Payer: Self-pay | Admitting: Family Medicine

## 2024-08-18 ENCOUNTER — Telehealth: Payer: Self-pay

## 2024-08-18 NOTE — Telephone Encounter (Signed)
 Copied from CRM [id number]. Topic: Clinical - Medication Prior Auth >> Aug 17, 2024  4:56 PM Alfonso ORN wrote: Reason for RMF:[facility] called on behalf of cx as PA  needed due to insurance rejecting cost override.  [facility] provided contact     : Informed rx [phone]

## 2024-08-23 LAB — OPHTHALMOLOGY REPORT-SCANNED

## 2024-08-25 ENCOUNTER — Telehealth: Payer: Self-pay | Admitting: *Deleted

## 2024-08-25 ENCOUNTER — Telehealth: Payer: Self-pay

## 2024-08-25 NOTE — Telephone Encounter (Signed)
 Copied from CRM [id number]. Topic: Clinical - Medication Prior Auth >> Aug 17, 2024  4:56 PM Alfonso ORN wrote: Reason for RMF:[facility] pharmacy called on behalf of cx as PA  needed due to insurance rejecting cost override.  [facility] provided contact     : Informed rx [phone] >> Aug 25, 2024  3:22 PM Terri MATSU wrote: Patient is calling again regarding PA for his medication; it was placed on 1/14 and advised him PA takes 7-10business days. He stated he needs the next shot by Wednesday and he is upset! >> Aug 25, 2024 12:39 PM Mercedes MATSU wrote: Patient called in again to follow up on the pending prior authorization. He states that a PA is needed due to insurance rejecting cost override. Patient is asking for the nurse or the doctor to please contact [facility]:[phone]

## 2024-08-25 NOTE — Telephone Encounter (Signed)
 Copied from CRM [id number]. Topic: Clinical - Medication Prior Auth >> Aug 17, 2024  4:56 PM [name] wrote: Reason for RMF:[facility] called on behalf of cx as PA  needed due to insurance rejecting cost override.  [facility] provided contact     : Informed rx [phone] >> Aug 25, 2024 12:39 PM [name] wrote: Patient called in again to follow up on the pending prior authorization. He states that a PA is needed due to insurance rejecting cost override. Patient is asking for the nurse or the doctor to please contact [phone]

## 2024-08-26 NOTE — Telephone Encounter (Signed)
 Patient called back to check on the status prior authorization needed for Monjaro; per patient they originally received the incorrect dosage for medication PA. They received 2.5 mg when patient is currently on the 10 MG dose. Per patient another document was sent over to the office on 08/22/24 for correct and the same documentation was sent back with the incorrect dosage onit.

## 2024-08-26 NOTE — Telephone Encounter (Signed)
 PA Mounjaro  10 mg

## 2024-08-31 ENCOUNTER — Telehealth: Payer: Self-pay

## 2024-08-31 ENCOUNTER — Other Ambulatory Visit (HOSPITAL_COMMUNITY): Payer: Self-pay

## 2024-08-31 NOTE — Telephone Encounter (Signed)
 I spoke with [name] with [employer] and he reported covered alternatives are Trazodone , Doxepin and Zolpidem

## 2024-08-31 NOTE — Telephone Encounter (Signed)
 Copied from CRM [id number]. Topic: Clinical - Medication Question >> Aug 31, 2024  1:09 PM Alexandria E wrote: Reason for CRM: Patient was told by his insurance that they no longer cover his QUVIVIQ  50 MG TABS. Patient is needing PCP to reach out to [phone] to see if any alternative medications are appropriate. Patient only has 2 tablets of that medication left. Patient's member ID # is [id number]

## 2024-09-01 ENCOUNTER — Telehealth: Payer: Self-pay

## 2024-09-01 ENCOUNTER — Other Ambulatory Visit (HOSPITAL_COMMUNITY): Payer: Self-pay

## 2024-09-01 NOTE — Telephone Encounter (Signed)
 Pharmacy Patient Advocate Encounter   Received notification from Pt Calls Messages that prior authorization for Mounjaro  10 is required/requested.   Insurance verification completed.   The patient is insured through [facility].   Per test claim: PA required; PA submitted to above mentioned insurance via Prompt PA Key/confirmation #/[id number] Status is pending

## 2024-09-01 NOTE — Telephone Encounter (Signed)
 I spoke with the patient and he reported he has previously tried Trazodone  with no success.

## 2024-09-01 NOTE — Telephone Encounter (Signed)
 Copied from CRM [id number]. Topic: Clinical - Medication Prior Auth >> Sep 01, 2024  2:52 PM Larissa S wrote: Reason for CRM:  Rx Benefits requesting a letter of medical necessity for MOUNJARO  10 MG/0.5ML Pen due to dosage increase from 2.5mg  to 10mg .  Fax [phone] PA# [id number] Callback # [phone]

## 2024-09-02 MED ORDER — DOXEPIN HCL 10 MG PO CAPS: 10.0000 mg | ORAL_CAPSULE | Freq: Every day | ORAL | 0 refills | Status: AC

## 2024-09-02 NOTE — Telephone Encounter (Signed)
 Rx sent and patient informed.

## 2024-09-02 NOTE — Addendum Note (Signed)
 Addended by: METTA KRISTEN CROME on: 09/02/2024 09:05 AM   Modules accepted: Orders

## 2024-09-05 ENCOUNTER — Encounter: Payer: Self-pay | Admitting: Family Medicine

## 2024-09-06 ENCOUNTER — Encounter: Payer: Self-pay | Admitting: Family Medicine

## 2024-09-06 DIAGNOSIS — R931 Abnormal findings on diagnostic imaging of heart and coronary circulation: Secondary | ICD-10-CM | POA: Insufficient documentation

## 2024-09-08 ENCOUNTER — Other Ambulatory Visit (HOSPITAL_COMMUNITY): Payer: Self-pay
